# Patient Record
Sex: Male | Born: 1935
Health system: Southern US, Community
[De-identification: ages and names within clinical notes are randomized; demographics above are authoritative.]

## PROBLEM LIST (undated history)

## (undated) DIAGNOSIS — H409 Unspecified glaucoma: Secondary | ICD-10-CM

## (undated) DIAGNOSIS — E785 Hyperlipidemia, unspecified: Secondary | ICD-10-CM

## (undated) DIAGNOSIS — I493 Ventricular premature depolarization: Secondary | ICD-10-CM

## (undated) DIAGNOSIS — I480 Paroxysmal atrial fibrillation: Secondary | ICD-10-CM

## (undated) DIAGNOSIS — F0390 Unspecified dementia without behavioral disturbance: Secondary | ICD-10-CM

## (undated) DIAGNOSIS — I10 Essential (primary) hypertension: Secondary | ICD-10-CM

## (undated) DIAGNOSIS — N189 Chronic kidney disease, unspecified: Secondary | ICD-10-CM

## (undated) DIAGNOSIS — I714 Abdominal aortic aneurysm, without rupture, unspecified: Secondary | ICD-10-CM

## (undated) DIAGNOSIS — I5030 Unspecified diastolic (congestive) heart failure: Secondary | ICD-10-CM

## (undated) HISTORY — DX: Unspecified diastolic (congestive) heart failure: I50.30

## (undated) HISTORY — DX: Abdominal aortic aneurysm, without rupture: I71.4

## (undated) HISTORY — PX: APPENDECTOMY: SHX54

## (undated) HISTORY — PX: EYE SURGERY: SHX253

## (undated) HISTORY — DX: Paroxysmal atrial fibrillation: I48.0

## (undated) HISTORY — PX: TENNIS ELBOW RELEASE/NIRSCHEL PROCEDURE: SHX6651

## (undated) HISTORY — DX: Ventricular premature depolarization: I49.3

## (undated) HISTORY — DX: Unspecified glaucoma: H40.9

## (undated) HISTORY — PX: TONSILLECTOMY: SUR1361

## (undated) HISTORY — PX: HERNIA REPAIR: SHX51

## (undated) HISTORY — DX: Chronic kidney disease, unspecified: N18.9

## (undated) HISTORY — PX: TOTAL SHOULDER REPLACEMENT: SUR1217

## (undated) HISTORY — DX: Abdominal aortic aneurysm, without rupture, unspecified: I71.40

---

## 2000-04-26 ENCOUNTER — Encounter (HOSPITAL_BASED_OUTPATIENT_CLINIC_OR_DEPARTMENT_OTHER): Payer: Self-pay | Admitting: General Surgery

## 2000-04-28 ENCOUNTER — Ambulatory Visit (HOSPITAL_COMMUNITY): Admission: RE | Admit: 2000-04-28 | Discharge: 2000-04-28 | Payer: Self-pay | Admitting: General Surgery

## 2000-04-28 ENCOUNTER — Encounter (INDEPENDENT_AMBULATORY_CARE_PROVIDER_SITE_OTHER): Payer: Self-pay | Admitting: *Deleted

## 2008-09-12 ENCOUNTER — Ambulatory Visit (HOSPITAL_COMMUNITY): Admission: RE | Admit: 2008-09-12 | Discharge: 2008-09-13 | Payer: Self-pay | Admitting: Orthopedic Surgery

## 2010-04-02 ENCOUNTER — Ambulatory Visit (HOSPITAL_COMMUNITY): Admission: RE | Admit: 2010-04-02 | Discharge: 2010-04-03 | Payer: Self-pay | Admitting: Orthopedic Surgery

## 2010-12-20 ENCOUNTER — Encounter: Payer: Self-pay | Admitting: Specialist

## 2011-02-11 ENCOUNTER — Other Ambulatory Visit: Payer: Self-pay | Admitting: Family Medicine

## 2011-02-16 LAB — COMPREHENSIVE METABOLIC PANEL
ALT: 20 U/L (ref 0–53)
AST: 20 U/L (ref 0–37)
Albumin: 4.5 g/dL (ref 3.5–5.2)
Alkaline Phosphatase: 67 U/L (ref 39–117)
BUN: 26 mg/dL — ABNORMAL HIGH (ref 6–23)
CO2: 28 mEq/L (ref 19–32)
Calcium: 9.6 mg/dL (ref 8.4–10.5)
Chloride: 102 mEq/L (ref 96–112)
Creatinine, Ser: 1.38 mg/dL (ref 0.4–1.5)
GFR calc Af Amer: 60 mL/min — ABNORMAL LOW (ref >60)
GFR calc non Af Amer: 49 mL/min — ABNORMAL LOW (ref >60)
Glucose, Bld: 117 mg/dL — ABNORMAL HIGH (ref 70–99)
Potassium: 4.1 mEq/L (ref 3.5–5.1)
Sodium: 139 mEq/L (ref 135–145)
Total Bilirubin: 0.9 mg/dL (ref 0.3–1.2)
Total Protein: 6.7 g/dL (ref 6.0–8.3)

## 2011-02-16 LAB — CBC
HCT: 46.5 % (ref 39.0–52.0)
Hemoglobin: 16.1 g/dL (ref 13.0–17.0)
MCHC: 34.6 g/dL (ref 30.0–36.0)
MCV: 94.8 fL (ref 78.0–100.0)
Platelets: 196 10*3/uL (ref 150–400)
RBC: 4.9 MIL/uL (ref 4.22–5.81)
RDW: 13.7 % (ref 11.5–15.5)
WBC: 8.1 10*3/uL (ref 4.0–10.5)

## 2011-02-16 LAB — URINALYSIS, ROUTINE W REFLEX MICROSCOPIC
Glucose, UA: NEGATIVE mg/dL
Hgb urine dipstick: NEGATIVE
Ketones, ur: NEGATIVE mg/dL
Protein, ur: NEGATIVE mg/dL
pH: 6.5 (ref 5.0–8.0)

## 2011-02-16 LAB — PROTIME-INR: Prothrombin Time: 13.5 seconds (ref 11.6–15.2)

## 2011-04-13 NOTE — Op Note (Signed)
NAME:  Shane Knight, Shane Knight NO.:  [id number]   MEDICAL RECORD NO.:  [id number]          PATIENT TYPE:  OIB   LOCATION:  5037                         FACILITY:  [facility]   PHYSICIAN:  Vania Rea. Supple, M.D.  DATE OF BIRTH:  12-04-1924   DATE OF PROCEDURE:  09/12/2008  DATE OF DISCHARGE:                               OPERATIVE REPORT   PREOPERATIVE DIAGNOSIS:  Left shoulder osteoarthritis.   POSTOPERATIVE DIAGNOSIS:  Left shoulder osteoarthritis.   PROCEDURE:  Left total shoulder arthroplasty utilizing a DePuy global  stem Press-Fit size 12, a cemented 48 pegged glenoid, and a 48 x 15  humeral head.   SURGEON:  Vania Rea. Supple, MD   ASSISTANT:  Lucita Lora. Shuford, PA-C   ANESTHESIA:  General endotracheal as well as local anesthetic at the end  of the case.   ESTIMATED BLOOD LOSS:  250 mL.   DRAINS:  None.   HISTORY:  Mr. Jessop is an 77 year old gentleman who has had chronic left  shoulder pain and significant functional limitations secondary to end-  stage osteoarthrosis.  His radiographs show marked deformity of the  humeral head with complete loss of joint space and peripheral osteophyte  formation.  Due to his ongoing pain and functional limitations, he is  brought to the operating room at this time for a planned left shoulder  arthroplasty.   Preoperatively, we counseled Mr. Nylund on treatment options as well as  risks versus benefits thereof.  Possible surgical complications of  bleeding, infection, neurovascular injury, persistent pain, loss of  motion, and possible need for revision surgery were reviewed.  He  understands and accepts and agrees with our planned procedure.   PROCEDURE IN DETAIL:  After undergoing routine preop evaluation, the  patient received prophylactic antibiotics.  An attempt was made at  interscalene block by the Anesthesia Department but successful  positioning of the block could not be achieved, so it was abandoned.  The patient did  receive prophylactic antibiotics.  He was brought to the  operating room, placed supine on the operating table, and underwent  smooth induction of general endotracheal anesthesia.  He was placed into  a gentle beach-chair position with approximately 30 degrees of elevation  of head of the bed.  The left shoulder girdle region was then sterilely  prepped and draped in standard fashion.  An anterior deltopectoral  incision was then made approximately 15 cm in length, centered over the  anterior aspect of the shoulder, beginning at the coracoid process and  extending laterally and distally.  Skin flaps were elevated.  Electrocautery was used for hemostasis.  The deltopectoral interval was  then identified and developed and the cephalic vein was retracted  laterally with the deltoid.  A self-retaining retractor was placed.  Conjoint tendon was identified and retracted medially.  The anterior  circumflex vessels were identified and were ligated as the subscapularis  was divided away from its attachment to the lesser tuberosity and then  free margin of the subscapularis was elevated and tagged with a series  of three #2 FiberWire sutures and  then it was completely mobilized  circumferentially dividing the anterior capsule and labrum as well.  This was then retracted anteriorly with a pitchfork retractor.  We then  completed resection of the capsule from the margin of the humeral head  and there was very prominent osteophyte inferiorly and anteriorly which  was divided with a rongeur and the capsule was completely released  allowing the delivery of the humeral head through the wound.  We then  used an oscillating saw to make an appropriate cut across the humeral  head protecting the rotator cuff and we also performed the tenotomy of  the biceps tendon.  We then placed a Fukuda retractor and performed a  circumferential release of the labrum and completely excised the labrum  from the periphery of  the glenoid.  The glenoid was then measured and  then 48 glenoid had the best coverage.  We then placed a central drill  hole and placed the 48 reamer and reamed the glenoid to a smooth  surface.  There was some subchondral cystic changes posteriorly which  were curetted out.  There was otherwise an excellent rim of subchondral  bone that we obtained.  We then performed the placement of drill holes  for the central peg as well as peripheral stabilizing pegs for the  pegged glenoid.  Copious lavage and irrigation was then completed.  We  performed a trial implant and this showed excellent fit and coverage.  The trial was removed.  The glenoid was meticulously cleaned and dried.  Cement was mixed in the appropriate consistency.  It was injected into  the peripheral peg holes.  The permanent glenoid was then impacted into  position and held as the cement hardened and excellent fit was achieved.  After this had hardened, we returned our attention to the proximal  humerus where we performed sequential reamings up to a size 12 of the  humeral canal and then broached upto size 12 and then performed a trial  reduction with both 48 and 52 heads and the 48 head ultimately had the  best coverage after we identified a number of osteophytes posteriorly  and inferiorly that needed to be debrided away.  The 48 head showed  excellent coverage of the cut surface of the humerus.  We removed the  trial broach.  The final size 12 stem was introduced in the humeral  canal and bone that we had harvested from the cut humeral head segment  was then packed around the fins and the proximal aspect of the humeral  implant and then the humeral stem was terminally impacted obtaining  excellent purchase and fit.  The 48 x 15 head was then impacted into the  [facility] taper of the humeral stem.  Then final reduction was performed.  There was excellent soft tissue balance, good stability, and good  mobility of the shoulder.   Final irrigation was performed and hemostasis  was obtained.  We repaired the subscapularis through bone tunnels in the  lesser tuberosity and then additionally repaired the rotator interval  approximately 2-3 cm from the tuberosities in a side-to-side fashion  with figure-of-eight #2 FiberWire sutures.  We then performed a biceps  tenodesis with the FiberWire and then amputated the excess stump of the  biceps tendon.  The wound was then terminally irrigated.  The  deltopectoral interval was allowed to close and was reapproximated  loosely with 0 Vicryl with a single #2 FiberWire at the midpoint of the  incision as  a marking stitch.  The subcu layer was then closed with 2-0  Vicryl and intracuticular 3-0 Monocryl used to close skin, followed by  Steri-Strips.  A bulky dry dressing was then applied over the left  shoulder.  Left arm was placed in sling immobilizer.  The patient was  then awakened, extubated, and taken to recovery room in stable  condition.      Vania Rea. Supple, M.D.  Electronically Signed     KMS/MEDQ  D:  09/12/2008  T:  09/13/2008  Job:  [id number]

## 2011-04-16 NOTE — Op Note (Signed)
[place]  Patient:    Shane Knight, Shane Knight                         MRN: [id number] Proc. Date: 04/28/00 Adm. Date:  [id number] Disc. Date: [id number] Attending:  Sonda Primes CC:         Mardene Celeste. Lurene Shadow, M.D. (2)                           Operative Report  PREOPERATIVE DIAGNOSIS:  Right inguinal hernia.  POSTOPERATIVE DIAGNOSIS:  Right inguinal hernia, direct and indirect.  PROCEDURE:   Right inguinal herniorrhaphy with Prolene mesh.  SURGEON:  Mardene Celeste. Lurene Shadow, M.D.  ASSISTANTRubye Oaks, PA student.  ANESTHESIA:  MAC.  I used 1% xylocaine with epinephrine.  BRIEF HISTORY:  This is a 77 year old presenting with right-sided groin bulge which on evaluation is noted to be a right inguinal hernia.  He discovered it 1 week prior to his evaluation.  This apparently had come on or he noted it after strenuous activity.  He had no symptoms of discomfort and he has not had any symptoms consistent with bowel obstruction.  He is brought to the operating room now for inguinal herniorrhaphy.  PROCEDURE: Following the induction of satisfactory sedation the patient was positioned supinely.  The right groin is prepped and draped to be included in the sterile operative field.  Infiltrating the right groin and lower abdominal crease with 1% Xylocaine with epinephrine.  I made a transverse incision in the lower abdominal crease and deepened this through the skin and subcutaneous tissue carrying it down to the external oblique aponeurosis.  Additional injections of xylocaine were used as needed.  The external oblique aponeurosis was opened up through the external inguinal ring with protection of the ilioinguinal nerve which was retracted inferiorly and laterally.  The spermatic cord was elevated and held with a Penrose drain.  The large fatty filled indirect sac was dissected free from the spermatic cord carrying dissection up to the internal ring.  At  the internal ring the sac was opened. The contents within the sac were reduced back into the peritoneal cavity and the sac was then doubly dilated with 2-0 silk sutures.  Redundant sac amputated for a different pathologic evaluation.  On inspection of direct space there was noted to be a rather large direct inguinal hernia which was repaired by an onlay patch of Prolene mesh sewn into the pubic tubercle and carried up along the conjoint tendon to the internal ring and again from the pubic tubercle carried up along the shelving edge of Pouparts ligament to the internal ring.  The mesh was slit so as to allow the normal protrusion of the spermatic cord.  The tails of the mesh were then trimmed and sutured to the internal oblique muscles.  The resulting reformed internal ring was noted to be adequate without constriction on the vessels of the cord.  The patient was asked for a cough response which was strong and the repair was noted to be intact.  Sponge, instrument, and sharp counts have been verified.  The wound was closed in layers as follows:  The external oblique aponeurosis closed with a running suture of 2-0 Vicryl, Scarpas fascia closed with a running suture of 3-0 Vicryl and the skin closed with a subcuticular stitch of 4-0 Monocryl.  Steri-Strips were placed to reinforce the wounds.  Sterile dressings  applied.  Anesthetic reversed and the patient removed from the operating room to the recovery room in stable condition having tolerated the procedure well. DD:  04/28/00 TD:  05/02/00 Job: [id number]

## 2011-08-30 LAB — URINALYSIS, ROUTINE W REFLEX MICROSCOPIC
Bilirubin Urine: NEGATIVE
Ketones, ur: NEGATIVE
Nitrite: NEGATIVE
Protein, ur: NEGATIVE
Urobilinogen, UA: 0.2

## 2011-08-30 LAB — CBC
HCT: 46.6
MCHC: 33.6
MCV: 95.1
RBC: 4.9

## 2011-08-30 LAB — BASIC METABOLIC PANEL
BUN: 27 — ABNORMAL HIGH
CO2: 28
Chloride: 103
Creatinine, Ser: 1.25
GFR calc Af Amer: >60
Potassium: 4

## 2011-08-30 LAB — COMPREHENSIVE METABOLIC PANEL
AST: 24
BUN: 20
CO2: 30
Calcium: 10.4
Chloride: 101
Creatinine, Ser: 1.13
GFR calc Af Amer: >60
GFR calc non Af Amer: >60
Total Bilirubin: 1.3 — ABNORMAL HIGH

## 2011-08-30 LAB — PROTIME-INR
INR: 0.9
Prothrombin Time: 12.3

## 2011-08-30 LAB — APTT: aPTT: 26

## 2017-04-28 ENCOUNTER — Emergency Department (HOSPITAL_BASED_OUTPATIENT_CLINIC_OR_DEPARTMENT_OTHER)
Admission: EM | Admit: 2017-04-28 | Discharge: 2017-04-28 | Disposition: A | Discharge status: Home / Self Care | Payer: Medicare Other | Attending: Emergency Medicine | Admitting: Emergency Medicine

## 2017-04-28 ENCOUNTER — Encounter (HOSPITAL_BASED_OUTPATIENT_CLINIC_OR_DEPARTMENT_OTHER): Payer: Self-pay | Admitting: *Deleted

## 2017-04-28 ENCOUNTER — Emergency Department (HOSPITAL_BASED_OUTPATIENT_CLINIC_OR_DEPARTMENT_OTHER): Payer: Medicare Other

## 2017-04-28 DIAGNOSIS — W228XXA Striking against or struck by other objects, initial encounter: Secondary | ICD-10-CM | POA: Insufficient documentation

## 2017-04-28 DIAGNOSIS — Y92009 Unspecified place in unspecified non-institutional (private) residence as the place of occurrence of the external cause: Secondary | ICD-10-CM | POA: Diagnosis not present

## 2017-04-28 DIAGNOSIS — S0990XA Unspecified injury of head, initial encounter: Secondary | ICD-10-CM | POA: Diagnosis present

## 2017-04-28 DIAGNOSIS — Y999 Unspecified external cause status: Secondary | ICD-10-CM | POA: Insufficient documentation

## 2017-04-28 DIAGNOSIS — I10 Essential (primary) hypertension: Secondary | ICD-10-CM | POA: Insufficient documentation

## 2017-04-28 DIAGNOSIS — W01118A Fall on same level from slipping, tripping and stumbling with subsequent striking against other sharp object, initial encounter: Secondary | ICD-10-CM | POA: Insufficient documentation

## 2017-04-28 DIAGNOSIS — Z23 Encounter for immunization: Secondary | ICD-10-CM | POA: Insufficient documentation

## 2017-04-28 DIAGNOSIS — Z7982 Long term (current) use of aspirin: Secondary | ICD-10-CM | POA: Insufficient documentation

## 2017-04-28 DIAGNOSIS — W19XXXA Unspecified fall, initial encounter: Secondary | ICD-10-CM

## 2017-04-28 DIAGNOSIS — Y939 Activity, unspecified: Secondary | ICD-10-CM | POA: Insufficient documentation

## 2017-04-28 DIAGNOSIS — Z87891 Personal history of nicotine dependence: Secondary | ICD-10-CM | POA: Diagnosis not present

## 2017-04-28 DIAGNOSIS — S0101XA Laceration without foreign body of scalp, initial encounter: Secondary | ICD-10-CM

## 2017-04-28 DIAGNOSIS — Z79899 Other long term (current) drug therapy: Secondary | ICD-10-CM | POA: Insufficient documentation

## 2017-04-28 HISTORY — DX: Essential (primary) hypertension: I10

## 2017-04-28 HISTORY — DX: Hyperlipidemia, unspecified: E78.5

## 2017-04-28 MED ORDER — TETANUS-DIPHTH-ACELL PERTUSSIS 5-2.5-18.5 LF-MCG/0.5 IM SUSP
0.5000 mL | Freq: Once | INTRAMUSCULAR | Status: AC
  Administered 2017-04-28: 0.5 mL via INTRAMUSCULAR
  Filled 2017-04-28: qty 0.5

## 2017-04-28 NOTE — ED Provider Notes (Signed)
[place] DEPT [facility] Provider Note   CSN: [id number] Arrival date & time: 04/28/17  1801  By signing my name below, I, Jaquelyn Bitter., attest that this documentation has been prepared under the direction and in the presence of Kip Kautzman, Gwenyth Allegra, *. Electronically signed: Jaquelyn Bitter., ED Scribe. 04/28/17. 6:27 PM.   History   Chief Complaint Chief Complaint  Patient presents with  . Head Injury    HPI Shane Knight is a 83 y.o. male who presents to the Emergency Department [facility] complaining of head injury with onset x1.5 hour s/p mechanical fall. Pt states that he was at the computer and when trying to get up to answer the telephone, he tripped over a cord hitting his head on a desk. Pt sustained a laceration to the top of the head which he states bled heavily initially. He also reports a headache and rates the pain 4/10. Pt describes the pain as flat and states that the pain is located at the top of the head. He denies any modifying factors. Pt denies chest pain, SOB, nausea, vomiting, LOC, abdominal pain, neck pain, vision changes, epistaxis, weakness. Of note, Daughter states that pt was started on a course of Prednisone for pinched nerve by his orthopedist. Pt's tetanus status is unknown.  The history is provided by the patient. No language interpreter was used.  Head Injury   The incident occurred 1 to 2 hours ago. He came to the ER via EMS. The injury mechanism was a fall. There was no loss of consciousness. The volume of blood lost was moderate. Quality: flat. The pain is at a severity of 4/10. The pain is mild. The pain has been constant since the injury. Pertinent negatives include no numbness, no blurred vision, no vomiting and no weakness. He was found conscious by EMS personnel. He has tried nothing for the symptoms.    Past Medical History:  Diagnosis Date  . Hyperlipemia   . Hypertension     There are no active problems to display for  this patient.   History reviewed. No pertinent surgical history.     Home Medications    Prior to Admission medications   Medication Sig Start Date End Date Taking? Authorizing Provider  acetaminophen (TYLENOL) 325 MG tablet Take 650 mg by mouth every 6 (six) hours as needed.   Yes [provider]  aspirin 81 MG chewable tablet Chew by mouth daily.   Yes [provider]  lisinopril-hydrochlorothiazide (PRINZIDE,ZESTORETIC) 10-12.5 MG tablet Take 1 tablet by mouth daily.   Yes [provider]  Multiple Vitamins-Minerals (MULTIVITAMIN WITH MINERALS) tablet Take 1 tablet by mouth daily.   Yes [provider]  predniSONE (STERAPRED UNI-PAK 21 TAB) 5 MG (21) TBPK tablet Take 5 mg by mouth daily.   Yes [provider]    Family History History reviewed. No pertinent family history.  Social History Social History  Substance Use Topics  . Smoking status: Former Research scientist (life sciences)  . Smokeless tobacco: Never Used  . Alcohol use Not on file     Allergies   Patient has no known allergies.   Review of Systems Review of Systems  HENT: Negative for nosebleeds.   Eyes: Negative for blurred vision and visual disturbance.  Cardiovascular: Negative for chest pain.  Gastrointestinal: Negative for abdominal pain, nausea and vomiting.  Musculoskeletal: Negative for neck pain.  Skin: Positive for wound.  Neurological: Positive for headaches. Negative for syncope, weakness and numbness.  All other  systems reviewed and are negative.    Physical Exam Updated Vital Signs BP 140/75 (BP Location: Right Arm)   Pulse (!) 105   Temp 98.2 F (36.8 C) (Oral)   Resp 18   Ht 5\' 8"  (1.727 m)   Wt 194 lb (88 kg)   SpO2 95%   BMI 29.50 kg/m   Physical Exam  Constitutional: He appears well-developed and well-nourished. No distress.  HENT:  Head: Head is with laceration.    Right Ear: External ear normal.  Left Ear: External ear normal.  Mouth/Throat:  Oropharynx is clear and moist.  Eyes: Conjunctivae and EOM are normal. Pupils are equal, round, and reactive to light.  Neck: Normal range of motion. Neck supple.  No neck tenderness.   Cardiovascular: Normal rate and regular rhythm.   No murmur heard. Pulmonary/Chest: Effort normal and breath sounds normal. No stridor. No respiratory distress. He has no wheezes. He exhibits no tenderness.  Abdominal: Soft. There is no tenderness.  Musculoskeletal: He exhibits no edema or tenderness.  Neurological: He is alert. He displays normal reflexes. No cranial nerve deficit or sensory deficit. He exhibits normal muscle tone. Coordination normal.  Skin: Skin is warm and dry. Capillary refill takes less than 2 seconds. Laceration noted. He is not diaphoretic. No pallor.  1.5 cm laceration to the top of the head that is hemostatic.   Psychiatric: He has a normal mood and affect.  Nursing note and vitals reviewed.    ED Treatments / Results   DIAGNOSTIC STUDIES: Oxygen Saturation is 95% on RA, inadequate by my interpretation.   COORDINATION OF CARE: 6:27 PM-Discussed next steps with pt. Pt verbalized understanding and is agreeable with the plan.    Labs (all labs ordered are listed, but only abnormal results are displayed) Labs Reviewed - No data to display  EKG  EKG Interpretation None       Radiology Ct Head Wo Contrast  Result Date: 04/28/2017 CLINICAL DATA:  83 year old male status post fall backwards hitting head on but case. Scalp laceration. EXAM: CT HEAD WITHOUT CONTRAST TECHNIQUE: Contiguous axial images were obtained from the base of the skull through the vertex without intravenous contrast. COMPARISON:  Cervical spine radiographs 08/13/2016. FINDINGS: Brain: Cerebral volume is within normal limits for age. Small chronic infarcts suspected in the posteromedial right cerebellar hemisphere. No other focal encephalomalacia identified. No midline shift, ventriculomegaly, mass  effect, evidence of mass lesion, intracranial hemorrhage or evidence of cortically based acute infarction. Elsewhere gray-white matter differentiation is within normal limits throughout the brain. Vascular: Calcified atherosclerosis at the skull base. No suspicious intracranial vascular hyperdensity. Skull: Intact.  No acute osseous abnormality identified. Sinuses/Orbits: Clear. Other: Postoperative changes to the globes, otherwise negative orbits soft tissues. No scalp hematoma identified. No subcutaneous gas identified to localize a scalp laceration. IMPRESSION: 1. No acute intracranial abnormality. No acute traumatic injury identified. 2. Age related cerebral volume loss and possible small chronic right cerebellar infarct. Electronically Signed   By: Genevie Ann M.D.   On: 04/28/2017 19:10    Procedures .Marland KitchenLaceration Repair Date/Time: 04/29/2017 1:26 PM Performed by: Courtney Paris Authorized by: Courtney Paris   Consent:    Consent obtained:  Verbal   Consent given by:  Patient   Risks discussed:  Infection, need for additional repair, poor wound healing, poor cosmetic result, pain and retained foreign body   Alternatives discussed:  No treatment Anesthesia (see MAR for exact dosages):    Anesthesia method:  None Laceration details:  Location:  Scalp   Scalp location:  Crown   Length (cm):  2   Depth (mm):  1 Repair type:    Repair type:  Simple Pre-procedure details:    Preparation:  Imaging obtained to evaluate for foreign bodies and patient was prepped and draped in usual sterile fashion Exploration:    Hemostasis achieved with:  Direct pressure   Wound exploration: wound explored through full range of motion and entire depth of wound probed and visualized     Contaminated: no   Treatment:    Area cleansed with:  Saline   Amount of cleaning:  Standard   Irrigation solution:  Sterile saline   Irrigation method:  Syringe   Visualized foreign bodies/material removed:  no   Skin repair:    Repair method:  Tissue adhesive Approximation:    Approximation:  Close   Vermilion border: well-aligned   Post-procedure details:    Dressing:  Antibiotic ointment   Patient tolerance of procedure:  Tolerated well, no immediate complications   (including critical care time)  Medications Ordered in ED Medications  Tdap (BOOSTRIX) injection 0.5 mL (0.5 mLs Intramuscular Given 04/28/17 1918)     Initial Impression / Assessment and Plan / ED Course  I have reviewed the triage vital signs and the nursing notes.  Pertinent labs & imaging results that were available during my care of the patient were reviewed by me and considered in my medical decision making (see chart for details).     REFORD OLLIFF is a 83 y.o. male With a past medical history significant for hypertension and hyperlipidemia who presents with a mechanical fall. Patient tripped on a computer cord and struck the top of his head on a table. No loss of consciousness. No vision changes, no vomiting. No neurologic complaints. No neck pain.  History and exam are seen above. Patient and small laceration on top of had. Small bleeding. No crepitance. Unremarkable neurologic exam. Chest and back nontender. Neck nontender. Lungs clear.  Tetanus shot was updated. CT head showed no acute traumatic injuries. No bleeding. No fracture.  One was washed and well approximated. Derma bond used for closure. Laceration repair without location.   Patient given instructions for PCP follow-up and strict return precautions for symptoms. Patient advised on return precautions for delayed bleed. Patient and family understood plan of care and had no other questions or concerns. Patient discharged in good condition.    Final Clinical Impressions(s) / ED Diagnoses   Final diagnoses:  Injury of head, initial encounter  Fall, initial encounter  Laceration of scalp, initial encounter    New Prescriptions Discharge Medication  List as of 04/28/2017  9:13 PM     I personally performed the services described in this documentation, which was scribed in my presence. The recorded information has been reviewed and is accurate.  Clinical Impression: 1. Injury of head, initial encounter   2. Fall, initial encounter   3. Laceration of scalp, initial encounter     Disposition: Discharge  Condition: Good  I have discussed the results, Dx and Tx plan with the pt(& family if present). He/she/they expressed understanding and agree(s) with the plan. Discharge instructions discussed at great length. Strict return precautions discussed and pt &/or family have verbalized understanding of the instructions. No further questions at time of discharge.    Discharge Medication List as of 04/28/2017  9:13 PM      Follow Up: [facility] [place]  [place] [id number] [phone] Schedule an appointment as soon as possible for a visit    [place] [address] [id number] [place] [address] [place] [phone]  If symptoms worsen      Carmon Brigandi, Gwenyth Allegra, MD 04/29/17 1327

## 2017-04-28 NOTE — ED Notes (Signed)
ED Provider at bedside. 

## 2017-04-28 NOTE — ED Triage Notes (Signed)
Pt reports his foot got caught on a cord in his computer room and he tripped, hitting his head on desk. No loc, abrasion noted to crown of head, no active bleeding noted, dsd in place. Pt denies any other c/o.

## 2017-04-28 NOTE — Discharge Instructions (Signed)
Please follow-up with your primary care physician as previously directed for further medical management of your fall. Please watch for signs of infection. Please do not pick at the skin glue. If any symptoms change or worsen or you develop any concerning symptoms, please return to the nearest emergency department for evaluation.

## 2017-05-23 ENCOUNTER — Ambulatory Visit (HOSPITAL_COMMUNITY)
Admission: RE | Admit: 2017-05-23 | Discharge: 2017-05-23 | Disposition: A | Discharge status: Home / Self Care | Payer: Medicare Other | Source: Ambulatory Visit | Attending: Cardiology | Admitting: Cardiology

## 2017-05-23 ENCOUNTER — Other Ambulatory Visit (HOSPITAL_COMMUNITY): Payer: Self-pay | Admitting: Physical Medicine and Rehabilitation

## 2017-05-23 DIAGNOSIS — E785 Hyperlipidemia, unspecified: Secondary | ICD-10-CM | POA: Diagnosis not present

## 2017-05-23 DIAGNOSIS — I714 Abdominal aortic aneurysm, without rupture, unspecified: Secondary | ICD-10-CM

## 2017-05-23 DIAGNOSIS — I10 Essential (primary) hypertension: Secondary | ICD-10-CM | POA: Diagnosis not present

## 2017-05-23 DIAGNOSIS — I7789 Other specified disorders of arteries and arterioles: Secondary | ICD-10-CM | POA: Diagnosis present

## 2017-05-23 DIAGNOSIS — Z87898 Personal history of other specified conditions: Secondary | ICD-10-CM | POA: Diagnosis not present

## 2017-05-23 DIAGNOSIS — I723 Aneurysm of iliac artery: Secondary | ICD-10-CM | POA: Insufficient documentation

## 2017-05-31 ENCOUNTER — Ambulatory Visit (INDEPENDENT_AMBULATORY_CARE_PROVIDER_SITE_OTHER): Payer: Medicare Other | Admitting: Podiatry

## 2017-05-31 ENCOUNTER — Encounter: Payer: Self-pay | Admitting: Podiatry

## 2017-05-31 VITALS — Ht 68.0 in | Wt 195.0 lb

## 2017-05-31 DIAGNOSIS — M79672 Pain in left foot: Secondary | ICD-10-CM

## 2017-05-31 DIAGNOSIS — B351 Tinea unguium: Secondary | ICD-10-CM

## 2017-05-31 DIAGNOSIS — M2040 Other hammer toe(s) (acquired), unspecified foot: Secondary | ICD-10-CM

## 2017-05-31 DIAGNOSIS — M79671 Pain in right foot: Secondary | ICD-10-CM | POA: Diagnosis not present

## 2017-05-31 NOTE — Progress Notes (Signed)
SUBJECTIVE: 83 y.o. year old male presents complaining of painful toes and thick toe nails.  REVIEW OF SYSTEMS: Pertinent items noted in HPI and remainder of comprehensive ROS otherwise negative.  OBJECTIVE: DERMATOLOGIC EXAMINATION: Nails: Thick dystrophic nails x 10. Digital corn 2nd right painful.  VASCULAR EXAMINATION OF LOWER LIMBS: All pedal pulses are not palpable. No ischemic changes noted. Temperature gradient from tibial crest to dorsum of foot is within normal bilateral.  NEUROLOGIC EXAMINATION OF THE LOWER LIMBS: Achilles DTR is present and within normal. Monofilament (Semmes-Weinstein 10-gm) sensory testing positive 6 out of 6, bilateral. Vibratory sensations(128Hz  turning fork) intact at medial and lateral forefoot bilateral.  Sharp and Dull discriminatory sensations at the plantar ball of hallux is intact bilateral.   MUSCULOSKELETAL EXAMINATION: Hammer toe 2nd bilateral.  ASSESSMENT: Onychomycosis x 10. Hammer toe deformity with painful corn 2nd right.  PLAN: All nails and corns debrided. Digital corn pad dispensed. Return in 3 month.

## 2017-05-31 NOTE — Patient Instructions (Signed)
Seen for hypertrophic nails. All nails debrided. A sheet of corn pad dispensed. Return in 3 months or as needed.

## 2017-08-31 ENCOUNTER — Ambulatory Visit (INDEPENDENT_AMBULATORY_CARE_PROVIDER_SITE_OTHER): Payer: Medicare Other | Admitting: Podiatry

## 2017-08-31 ENCOUNTER — Encounter: Payer: Self-pay | Admitting: Podiatry

## 2017-08-31 DIAGNOSIS — M79672 Pain in left foot: Secondary | ICD-10-CM | POA: Diagnosis not present

## 2017-08-31 DIAGNOSIS — M79671 Pain in right foot: Secondary | ICD-10-CM

## 2017-08-31 DIAGNOSIS — B351 Tinea unguium: Secondary | ICD-10-CM

## 2017-08-31 NOTE — Progress Notes (Signed)
Subjective: 83 y.o. year old male patient presents complaining of painful nails. Patient requests toe nails trimmed. Stated that left great toe nail removed in past. Now the nail grew back and make the toe swell and pain. Has some problem with balance and uses cane.  HPI: Takes Lyrica for the past 3-4 years and Neuropathy pain has been controlled.  Patient Summary List & History reviewed for allergies, medications, medical problems and surgical history.  Objective: Dermatologic:  Thick dystrophic nails x 10. Deformed painful nail on left great toe. Dry red bumps on left foot near first MPJ Vascular: All pedal pulses are palpable. Mild ankle edema right. Orthopedic:  Rectus foot without gross deformity. Neurologic:  All epicritic and tactile sensations grossly intact.  Assessment: Dystrophic mycotic nails x 10. Tinea pedis left foot.  Treatment: Debrided all mycotic nails.  Luzu cream dispensed for the red bump on left foot. Advised to wear more open toed shoes for the left foot rash.

## 2017-08-31 NOTE — Patient Instructions (Addendum)
Seen for hypertrophic nails. All nails debrided. Stay in open toed shoes more to aerate left foot rash problem. Return in 3 months or as needed.

## 2017-12-01 ENCOUNTER — Ambulatory Visit: Payer: Medicare Other | Admitting: Podiatry

## 2019-01-29 ENCOUNTER — Ambulatory Visit (INDEPENDENT_AMBULATORY_CARE_PROVIDER_SITE_OTHER): Payer: Medicare Other | Admitting: Cardiology

## 2019-01-29 ENCOUNTER — Encounter: Payer: Self-pay | Admitting: Cardiology

## 2019-01-29 ENCOUNTER — Other Ambulatory Visit: Payer: Self-pay | Admitting: Cardiology

## 2019-01-29 VITALS — BP 126/79 | HR 80 | Ht 68.0 in | Wt 183.0 lb

## 2019-01-29 DIAGNOSIS — I129 Hypertensive chronic kidney disease with stage 1 through stage 4 chronic kidney disease, or unspecified chronic kidney disease: Secondary | ICD-10-CM | POA: Diagnosis not present

## 2019-01-29 DIAGNOSIS — I714 Abdominal aortic aneurysm, without rupture, unspecified: Secondary | ICD-10-CM

## 2019-01-29 DIAGNOSIS — I10 Essential (primary) hypertension: Secondary | ICD-10-CM

## 2019-01-29 DIAGNOSIS — N183 Chronic kidney disease, stage 3 unspecified: Secondary | ICD-10-CM | POA: Insufficient documentation

## 2019-01-29 DIAGNOSIS — I493 Ventricular premature depolarization: Secondary | ICD-10-CM | POA: Diagnosis not present

## 2019-01-29 MED ORDER — AMLODIPINE BESYLATE 5 MG PO TABS: 5.0000 mg | ORAL_TABLET | Freq: Every day | ORAL | 1 refills | Status: DC

## 2019-01-29 MED ORDER — LISINOPRIL-HYDROCHLOROTHIAZIDE 10-12.5 MG PO TABS: 1.0000 | ORAL_TABLET | Freq: Every day | ORAL | 3 refills | Status: DC

## 2019-01-29 NOTE — Progress Notes (Signed)
Subjective:   Shane Knight, male    DOB: Apr 12, 1925, 85 y.o.   MRN: [id number]  Corine Shelter, PA-C:  Chief Complaint  Patient presents with  . Follow-up    pvc's     HPI: Shane Knight  is a 85 y.o. male  with asymptomatic small 3 cm abdominal aortic aneurysm detected by duplex on 05/23/2017 while evaluating back pain. Also has past medical history of stage III chronic kidney disease, chronic back pain, hypertension, and abnormal EKG.   Echo in July 2019 was essentially unchanged compared to August of 2018 except for new mild RV dilation and mildly increased PA pressure. Blood pressure was elevated on last office visit; however, he monitored at home and blood pressure was consistently in the 563'O systolic.  Patient was last seen in December 2019, blood pressure was elevated.  He was started on amlodipine 5 mg daily that he tolerates well.  He has been out of the medication for the last 1 month as his prescription ran out and he was unsure if he should continue.  He has been monitoring his blood pressure at home and consistently in the 120s to 150s.  Daughter is present at bedside.  He has recently had abdominal aortic ultrasound for surveillance of aortic aneurysm.  He was not noted to have aneurysm on this ultrasound.  No complaints today.  Past Medical History:  Diagnosis Date  . Hyperlipemia   . Hypertension     History reviewed. No pertinent surgical history.  History reviewed. No pertinent family history.  Social History   Socioeconomic History  . Marital status: Widowed    Spouse name: Not on file  . Number of children: Not on file  . Years of education: Not on file  . Highest education level: Not on file  Occupational History  . Not on file  Social Needs  . Financial resource strain: Not on file  . Food insecurity:    Worry: Not on file    Inability: Not on file  . Transportation needs:    Medical: Not on file    Non-medical: Not on file  Tobacco Use  .  Smoking status: Former Research scientist (life sciences)  . Smokeless tobacco: Never Used  Substance and Sexual Activity  . Alcohol use: Not on file  . Drug use: Not on file  . Sexual activity: Not on file  Lifestyle  . Physical activity:    Days per week: Not on file    Minutes per session: Not on file  . Stress: Not on file  Relationships  . Social connections:    Talks on phone: Not on file    Gets together: Not on file    Attends religious service: Not on file    Active member of club or organization: Not on file    Attends meetings of clubs or organizations: Not on file    Relationship status: Not on file  . Intimate partner violence:    Fear of current or ex partner: Not on file    Emotionally abused: Not on file    Physically abused: Not on file    Forced sexual activity: Not on file  Other Topics Concern  . Not on file  Social History Narrative  . Not on file    Current Meds  Medication Sig  . acetaminophen (TYLENOL) 325 MG tablet Take 650 mg by mouth every 6 (six) hours as needed.  . latanoprost (XALATAN) 0.005 % ophthalmic solution Place  1 drop into both eyes daily.  Marland Kitchen lisinopril-hydrochlorothiazide (PRINZIDE,ZESTORETIC) 10-12.5 MG tablet Take 1 tablet by mouth daily for 30 days.  . Multiple Vitamins-Minerals (MULTIVITAMIN WITH MINERALS) tablet Take 1 tablet by mouth daily.  . pregabalin (LYRICA) 100 MG capsule Take 100 mg by mouth 2 (two) times daily after a meal.  . [DISCONTINUED] lisinopril-hydrochlorothiazide (PRINZIDE,ZESTORETIC) 10-12.5 MG tablet Take 1 tablet by mouth daily.     Review of Systems  Constitution: Negative for decreased appetite, malaise/fatigue, weight gain and weight loss.  Eyes: Negative for visual disturbance.  Cardiovascular: Negative for chest pain, claudication, dyspnea on exertion, leg swelling, orthopnea, palpitations and syncope.  Respiratory: Negative for hemoptysis and wheezing.   Endocrine: Negative for cold intolerance and heat intolerance.    Hematologic/Lymphatic: Does not bruise/bleed easily.  Skin: Negative for nail changes.  Musculoskeletal: Negative for muscle weakness and myalgias.  Gastrointestinal: Negative for abdominal pain, change in bowel habit, nausea and vomiting.  Neurological: Negative for difficulty with concentration, dizziness, focal weakness and headaches.  Psychiatric/Behavioral: Negative for altered mental status and suicidal ideas.  All other systems reviewed and are negative.      Objective:     Blood pressure 126/79, pulse 80, height 5\' 8"  (1.727 m), weight 183 lb (83 kg).  Echocardiogram 06/27/2018: Left ventricle cavity is normal in size. Mild concentric hypertrophy of the left ventricle. Wall motion probably normal. Accurate assessment of LVEF and diastolic function limited due to multiple PVC's, ?Afib.  Calculated EF 54%. Left atrial cavity is mildly dilated. Right atrial cavity is mildly dilated. Right ventricle cavity is mildly dilated. Normal right ventricular function. Moderate tricuspid regurgitation. Estimated pulmonary artery systolic pressure 43 mmHg. Mild to moderate pulmonic regurgitation. Compared to previous study on 07/27/2018, mild RV dilatation is new. PASP mildly increased.  Carotid artery duplex 07/27/2017: No hemodynamically significant arterial disease in the internal carotid artery bilaterally. Minimal soft plaque noted. Antegrade right vertebral artery flow. Antegrade left vertebral artery flow.  Abdominal aortic duplex 12/18/2018: The maximum aorta diameter is 2.1 cm (mid). No evidence of atherosclerotic plaque. Normal flow velocities noted.  No AAA noted.  Compared to outside study 05/23/2017:  Proximal abdominal aneurysm measuring 3.1 cm x 3.1 cm, moderate dilatation of the left common iliac artery measuring 1.6 cm.  Normal iliac artery velocity.  EKG 01/29/2019: Sinus rhythm at 78 bpm with first degree AV block with frequent PAC's and 1 PVC. Left atrial enlargement,  left axis deviation, left anterior fasicular block, IRBBB. Nonspecific T abnormality.  Physical Exam  Constitutional: He is oriented to person, place, and time. Vital signs are normal. He appears well-developed and well-nourished.  HENT:  Head: Normocephalic and atraumatic.  Neck: Normal range of motion.  Cardiovascular: Normal rate, regular rhythm, normal heart sounds and intact distal pulses.  Extrasystoles are present.  Pulses:      Radial pulses are 2+ on the right side and 2+ on the left side.  Pulmonary/Chest: Effort normal and breath sounds normal. No accessory muscle usage. No respiratory distress.  Abdominal: Soft. Bowel sounds are normal.  Musculoskeletal: Normal range of motion.  Neurological: He is alert and oriented to person, place, and time.  Skin: Skin is warm and dry.  Vitals reviewed.          Assessment & Recommendations:   1. Essential hypertension Stable today without amlodipine; however, home monitoring shows that has been fluctuating from 150's to 120's. I will resume amlodipine as he tolerated this well. Will refill his lisinopril HCT.  2. Frequent PVCs Has frequent PAC's today. Remains asymptomatic.   3. CKD (chronic kidney disease) stage 3, GFR 30-59 ml/min (HCC) Has remained stable.   4. Abdominal aneurysm Previously noted to be 3.1 cm in 2018, he is not noted to AAA on recent abdominal duplex. Max diameter was 2.1cm. Does not need follow up duplex in the future unless clinically indicated.   I will see him back in 6 weeks for follow up on hypertension.  Jeri Lager, FNP-C [facility], [place] Office: [phone] Fax: [phone]

## 2019-01-30 ENCOUNTER — Encounter: Payer: Self-pay | Admitting: Cardiology

## 2019-03-13 ENCOUNTER — Other Ambulatory Visit: Payer: Self-pay

## 2019-03-13 ENCOUNTER — Ambulatory Visit (INDEPENDENT_AMBULATORY_CARE_PROVIDER_SITE_OTHER): Payer: Medicare Other | Admitting: Cardiology

## 2019-03-13 ENCOUNTER — Encounter: Payer: Self-pay | Admitting: Cardiology

## 2019-03-13 ENCOUNTER — Ambulatory Visit: Payer: Medicare Other | Admitting: Cardiology

## 2019-03-13 VITALS — BP 126/67 | HR 54 | Ht 68.0 in | Wt 180.0 lb

## 2019-03-13 DIAGNOSIS — N183 Chronic kidney disease, stage 3 unspecified: Secondary | ICD-10-CM

## 2019-03-13 DIAGNOSIS — I10 Essential (primary) hypertension: Secondary | ICD-10-CM

## 2019-03-13 DIAGNOSIS — R42 Dizziness and giddiness: Secondary | ICD-10-CM | POA: Diagnosis not present

## 2019-03-13 DIAGNOSIS — I493 Ventricular premature depolarization: Secondary | ICD-10-CM

## 2019-03-13 MED ORDER — AMLODIPINE BESYLATE 2.5 MG PO TABS: 2.5000 mg | ORAL_TABLET | Freq: Every day | ORAL | 3 refills | Status: DC

## 2019-03-13 NOTE — Progress Notes (Signed)
Subjective:   Shane Knight, male    DOB: [birth year], 85 y.o.   MRN: [id number]  Corine Shelter, PA-C:  Chief Complaint  Patient presents with  . Hypertension    pt think amlodipine is making him dizzy    This visit type was conducted due to national recommendations for restrictions regarding the COVID-19 Pandemic (e.g. social distancing).  This format is felt to be most appropriate for this patient at this time.  All issues noted in this document were discussed and addressed.  No physical exam was performed (except for noted visual exam findings with Telehealth visits).  The patient has consented to conduct a Telehealth visit and understands insurance will be billed.   I discussed the limitations of evaluation and management by telemedicine and the availability of in person appointments. The patient expressed understanding and agreed to proceed.  Virtual Visit via Video Note is as below  I connected with Shane Knight, on 03/13/19 at 1055 by telephone and verified that I am speaking with the correct person using two identifiers. Patient was unable to perform video visit.    I have discussed with her regarding the safety during COVID Pandemic and steps and precautions including social distancing with the patient.    HPI: Shane Knight  is a 85 y.o. male  with asymptomatic small 3 cm abdominal aortic aneurysm detected by duplex on 05/23/2017 while evaluating back pain; however, was not noted on recent abdominal aortic duplex. Also has past medical history of stage III chronic kidney disease, chronic back pain, hypertension, and abnormal EKG.   Echo in July 2019 was essentially unchanged compared to August of 2018 except for new mild RV dilation and mildly increased PA pressure.   Patient was last seen 1 month ago and was restarted on amlodipine. Blood pressure has consistently been in the 956'L systolic with occasional spikes in the 140's. He does mention that he notices some mild dizziness  with position changes since being on the amlodipine. No syncope or falls. He is compliant with support stockings daily that helps his venous insufficiency. Has chronic, frequent PVC's that are asymptomatic and stable.  No complaints today.  Past Medical History:  Diagnosis Date  . AAA (abdominal aortic aneurysm) ([place])   . Chronic kidney disease   . Hyperlipemia   . Hypertension   . PVC (premature ventricular contraction)     Past Surgical History:  Procedure Laterality Date  . APPENDECTOMY    . EYE SURGERY     lens implants  . HERNIA REPAIR    . TENNIS ELBOW RELEASE/NIRSCHEL PROCEDURE    . TONSILLECTOMY    . TOTAL SHOULDER REPLACEMENT Bilateral     No family history on file.  Social History   Socioeconomic History  . Marital status: Widowed    Spouse name: Not on file  . Number of children: Not on file  . Years of education: Not on file  . Highest education level: Not on file  Occupational History  . Not on file  Social Needs  . Financial resource strain: Not on file  . Food insecurity:    Worry: Not on file    Inability: Not on file  . Transportation needs:    Medical: Not on file    Non-medical: Not on file  Tobacco Use  . Smoking status: Former Research scientist (life sciences)  . Smokeless tobacco: Never Used  Substance and Sexual Activity  . Alcohol use: Not on file  . Drug use:  Not on file  . Sexual activity: Not on file  Lifestyle  . Physical activity:    Days per week: Not on file    Minutes per session: Not on file  . Stress: Not on file  Relationships  . Social connections:    Talks on phone: Not on file    Gets together: Not on file    Attends religious service: Not on file    Active member of club or organization: Not on file    Attends meetings of clubs or organizations: Not on file    Relationship status: Not on file  . Intimate partner violence:    Fear of current or ex partner: Not on file    Emotionally abused: Not on file    Physically abused: Not on file     Forced sexual activity: Not on file  Other Topics Concern  . Not on file  Social History Narrative  . Not on file    Current Meds  Medication Sig  . acetaminophen (TYLENOL) 325 MG tablet Take 650 mg by mouth every 6 (six) hours as needed.  Marland Kitchen amLODipine (NORVASC) 5 MG tablet TAKE 1 TABLET BY MOUTH EVERY DAY  . latanoprost (XALATAN) 0.005 % ophthalmic solution Place 1 drop into both eyes daily.  Marland Kitchen lisinopril-hydrochlorothiazide (PRINZIDE,ZESTORETIC) 10-12.5 MG tablet Take 1 tablet by mouth daily for 30 days.  . Multiple Vitamins-Minerals (MULTIVITAMIN WITH MINERALS) tablet Take 1 tablet by mouth daily.  . Multiple Vitamins-Minerals (PRESERVISION AREDS 2+MULTI VIT PO) Take 2 capsules by mouth daily.  . pregabalin (LYRICA) 100 MG capsule Take 100 mg by mouth 2 (two) times daily after a meal.     Review of Systems  Constitution: Negative for decreased appetite, malaise/fatigue, weight gain and weight loss.  Eyes: Negative for visual disturbance.  Cardiovascular: Negative for chest pain, claudication, dyspnea on exertion, leg swelling, orthopnea, palpitations and syncope.  Respiratory: Negative for hemoptysis and wheezing.   Endocrine: Negative for cold intolerance and heat intolerance.  Hematologic/Lymphatic: Does not bruise/bleed easily.  Skin: Negative for nail changes.  Musculoskeletal: Negative for muscle weakness and myalgias.  Gastrointestinal: Negative for abdominal pain, change in bowel habit, nausea and vomiting.  Neurological: Positive for dizziness. Negative for difficulty with concentration, focal weakness and headaches.  Psychiatric/Behavioral: Negative for altered mental status and suicidal ideas.  All other systems reviewed and are negative.      Objective:     Blood pressure 126/67, pulse (!) 54, height 5\' 8"  (1.727 m), weight 180 lb (81.6 kg).  Echocardiogram 06/27/2018: Left ventricle cavity is normal in size. Mild concentric hypertrophy of the left ventricle.  Wall motion probably normal. Accurate assessment of LVEF and diastolic function limited due to multiple PVC's, ?Afib.  Calculated EF 54%. Left atrial cavity is mildly dilated. Right atrial cavity is mildly dilated. Right ventricle cavity is mildly dilated. Normal right ventricular function. Moderate tricuspid regurgitation. Estimated pulmonary artery systolic pressure 43 mmHg. Mild to moderate pulmonic regurgitation. Compared to previous study on 07/27/2018, mild RV dilatation is new. PASP mildly increased.  Carotid artery duplex 07/27/2017: No hemodynamically significant arterial disease in the internal carotid artery bilaterally. Minimal soft plaque noted. Antegrade right vertebral artery flow. Antegrade left vertebral artery flow.  Abdominal aortic duplex 12/18/2018: The maximum aorta diameter is 2.1 cm (mid). No evidence of atherosclerotic plaque. Normal flow velocities noted.  No AAA noted.  Compared to outside study 05/23/2017:  Proximal abdominal aneurysm measuring 3.1 cm x 3.1 cm, moderate dilatation of the left  common iliac artery measuring 1.6 cm.  Normal iliac artery velocity.  EKG 01/29/2019: Sinus rhythm at 78 bpm with first degree AV block with frequent PAC's and 1 PVC. Left atrial enlargement, left axis deviation, left anterior fasicular block, IRBBB. Nonspecific T abnormality.  *Physical exam not performed as this is a telephone visit*       Assessment & Recommendations:   1. Essential hypertension Blood pressure has improved with addition of amlodipine. Continue with home BP monitoring.   2. PVC (premature ventricular contraction) Has history of frequent PVC and PAC's that are asymptomatic.   3. Dizziness Noted with position changes since addition of amlodipine. In view of his advanced age and risk of falling, feel that it is imperative to avoid any dizziness. I will decrease his dose down to 2.5 mg daily. I have also asked him to take at night. Continue with daily  support stockings. Advised him on careful, slow position changes to help avoid this.   4. CKD (chronic kidney disease) stage 3, GFR 30-59 ml/min (HCC) Has been stable. I do not have recent labs from PCP office. He is on Lisinopril HCTZ 10-12.5 mg daily. Would like to avoid increasing if possible to hopefully not worsen kidney function.   Plan: Patient is overall doing well. He will contact me for any problems or concerns. I will see him back in the office in 3 months for follow up.    Jeri Lager, FNP-C [facility], [place] Office: [phone] Fax: [phone]

## 2019-03-29 IMAGING — CT CT HEAD W/O CM
3 series · 14 of 47 positions shown, 16 images · non-contrast
Comparison: Cervical spine radiographs 08/13/2016.

CLINICAL DATA: 89-year-old male status post fall backwards hitting
head on but case. Scalp laceration.

EXAM:
CT HEAD WITHOUT CONTRAST
TECHNIQUE: Contiguous axial images were obtained from the base of the skull
through the vertex without intravenous contrast.

[Series 2: head wo · axial · 0.44mm/px · z∈[-296,-171]mm · 8 of 30 slices shown, 10 images]
[im 3/30  brain]
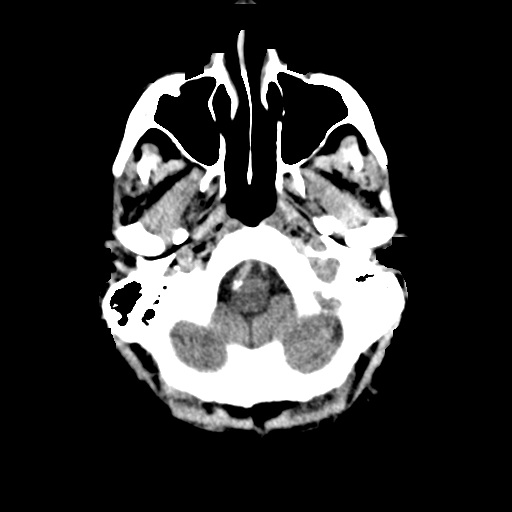
[im 3/30  bone]
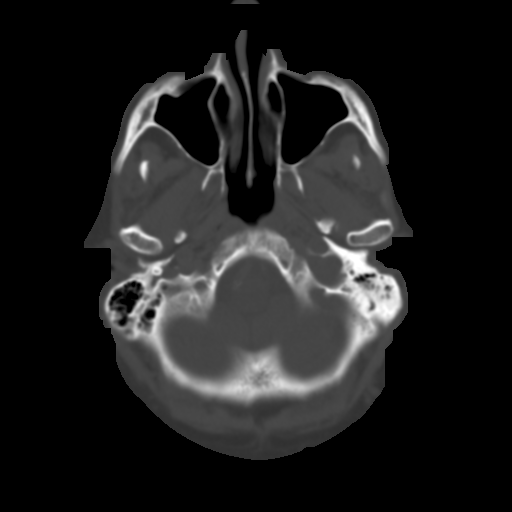
[im 7/30  brain]
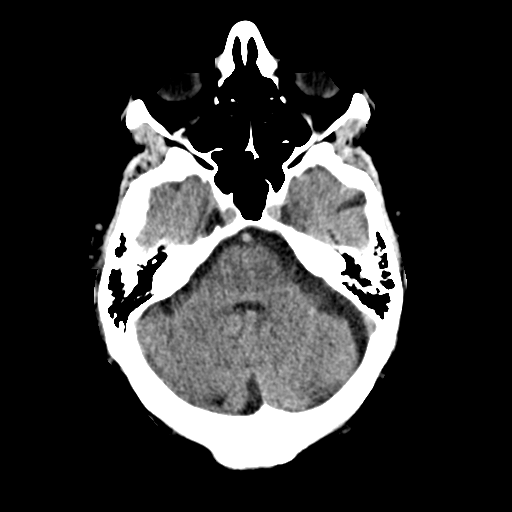
[im 10/30  brain]
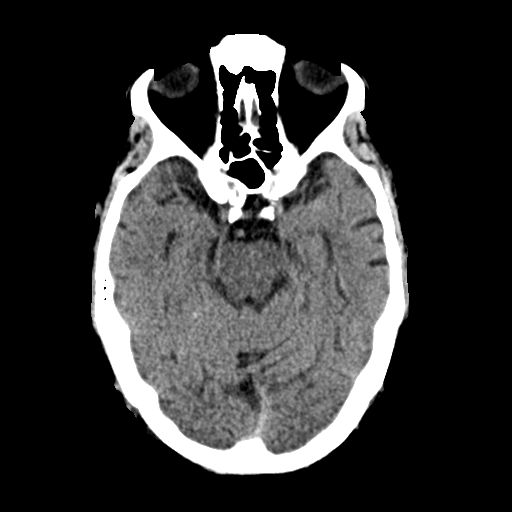
[im 14/30  brain]
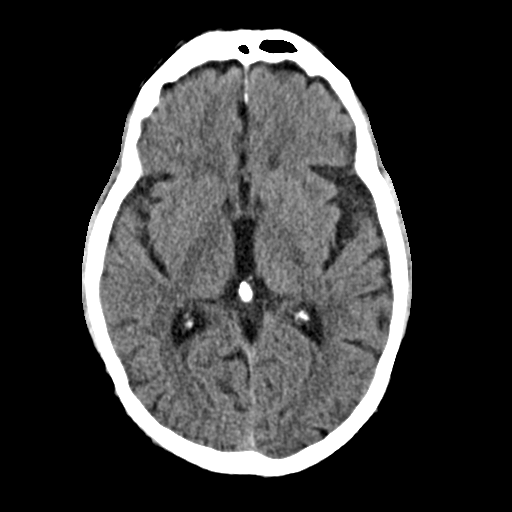
[im 17/30  brain]
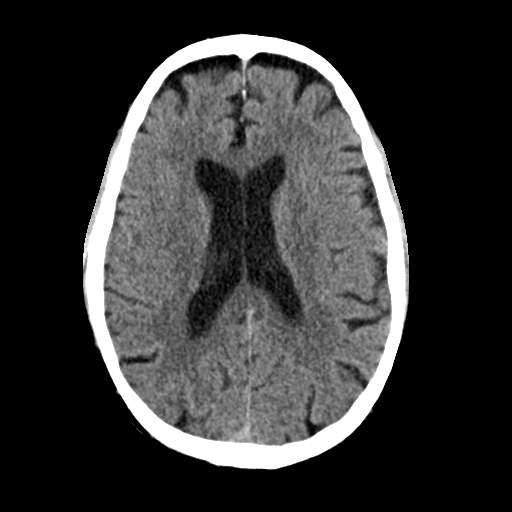
[im 17/30  bone]
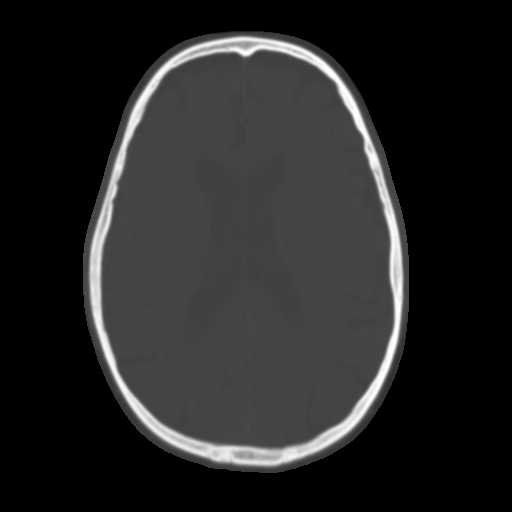
[im 21/30  brain]
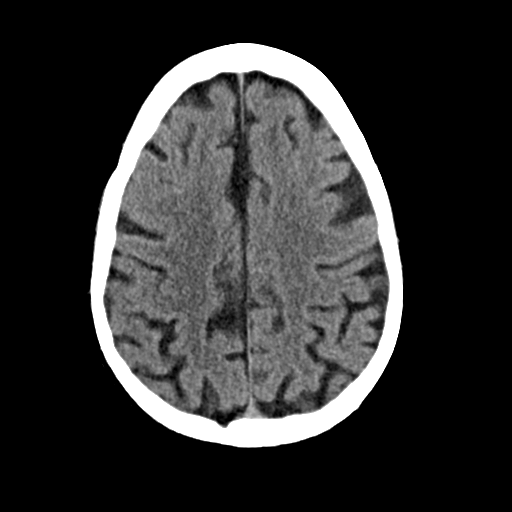
[im 24/30  brain]
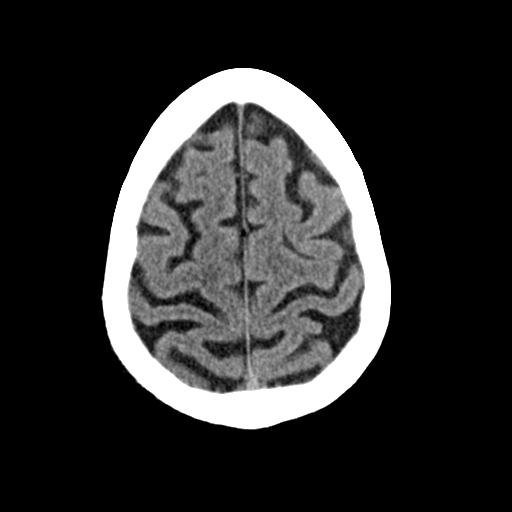
[im 28/30  brain]
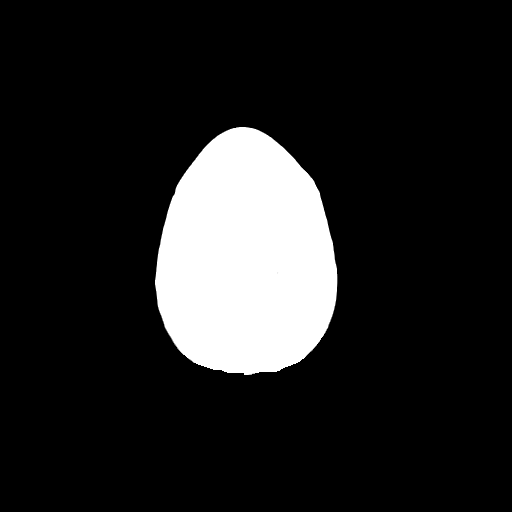

[Series 4: coronal soft · coronal · 0.28mm/px · 3 of 70 slices shown]
[im 24/70  brain]
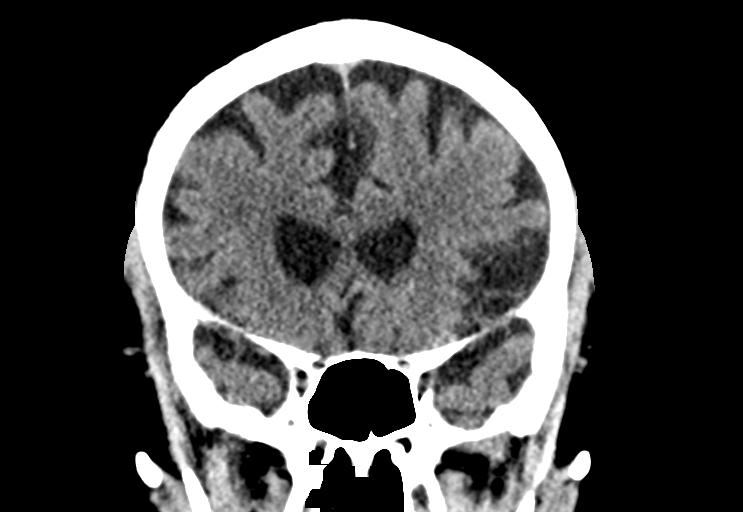
[im 31/70  brain]
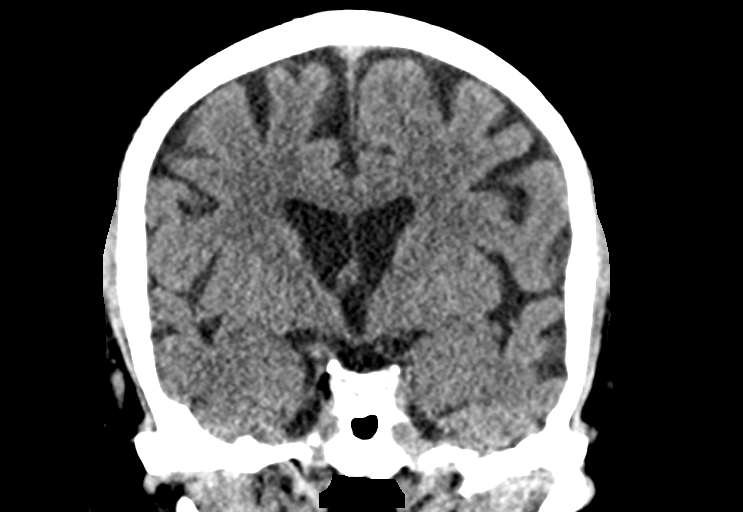
[im 39/70  brain]
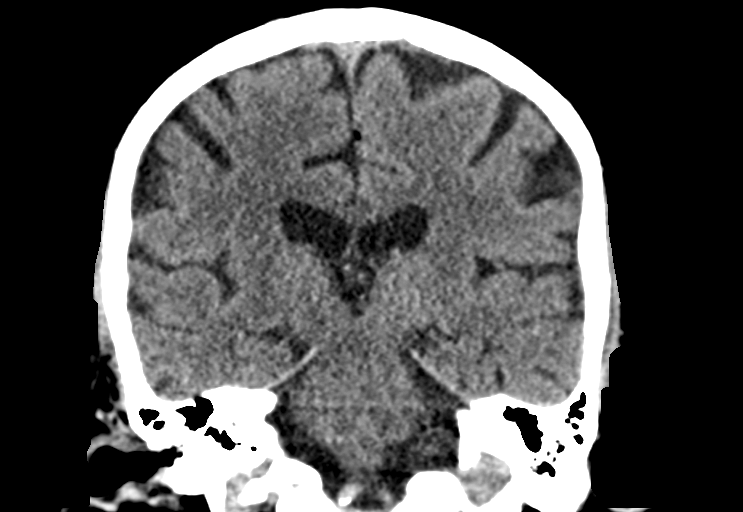

[Series 5: sag soft · sagittal · 0.29mm/px · 3 of 55 slices shown]
[im 19/55  brain]
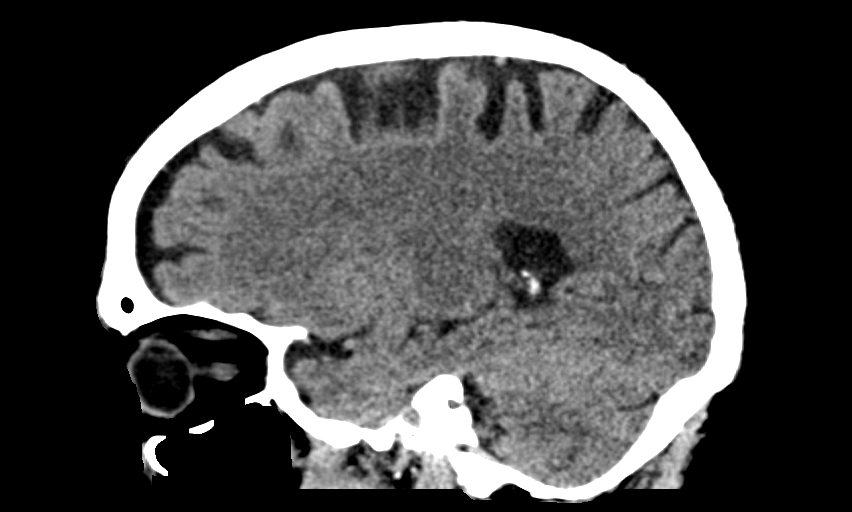
[im 28/55  brain]
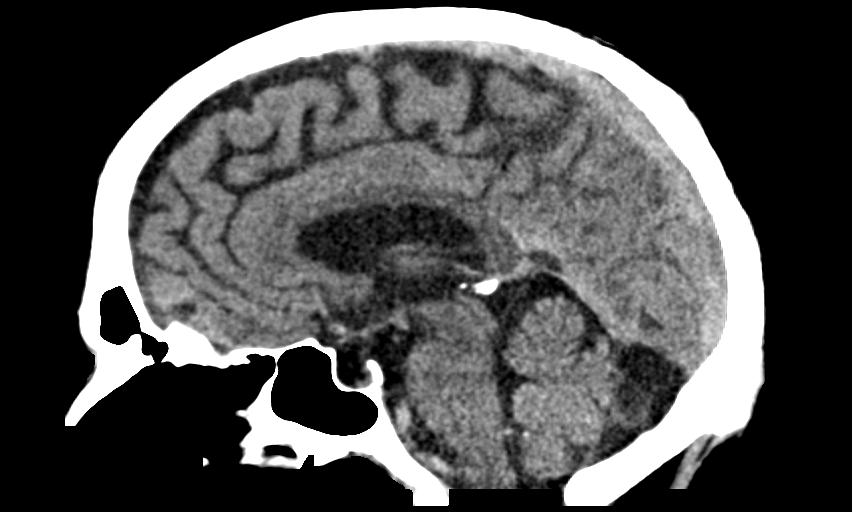
[im 37/55  brain]
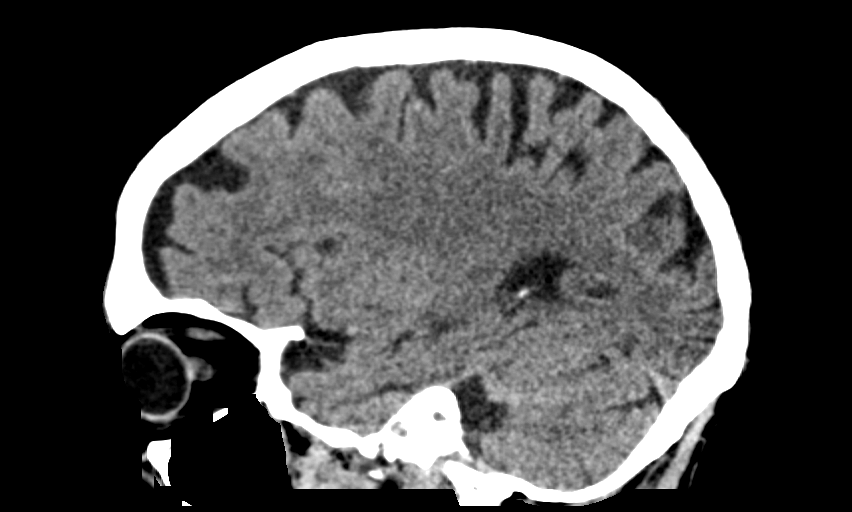

[14 of 47 positions shown; findings below may reference images not displayed]

FINDINGS: Brain: Cerebral volume is within normal limits for age. Small
chronic infarcts suspected in the posteromedial right cerebellar
hemisphere. No other focal encephalomalacia identified. No midline
shift, ventriculomegaly, mass effect, evidence of mass lesion,
intracranial hemorrhage or evidence of cortically based acute
infarction. Elsewhere gray-white matter differentiation is within
normal limits throughout the brain.

Vascular: Calcified atherosclerosis at the skull base. No suspicious
intracranial vascular hyperdensity.

Skull: Intact.  No acute osseous abnormality identified.

Sinuses/Orbits: Clear.

Other: Postoperative changes to the globes, otherwise negative
orbits soft tissues. No scalp hematoma identified. No subcutaneous
gas identified to localize a scalp laceration.
IMPRESSION: 1. No acute intracranial abnormality. No acute traumatic injury
identified.
2. Age related cerebral volume loss and possible small chronic right
cerebellar infarct.

## 2019-05-07 ENCOUNTER — Ambulatory Visit: Payer: Medicare Other | Admitting: Cardiology

## 2019-06-11 ENCOUNTER — Ambulatory Visit (INDEPENDENT_AMBULATORY_CARE_PROVIDER_SITE_OTHER): Payer: Medicare Other | Admitting: Cardiology

## 2019-06-11 ENCOUNTER — Other Ambulatory Visit: Payer: Self-pay

## 2019-06-11 ENCOUNTER — Encounter: Payer: Self-pay | Admitting: Cardiology

## 2019-06-11 VITALS — BP 125/65 | HR 69 | Ht 68.5 in | Wt 178.2 lb

## 2019-06-11 DIAGNOSIS — R42 Dizziness and giddiness: Secondary | ICD-10-CM

## 2019-06-11 DIAGNOSIS — I10 Essential (primary) hypertension: Secondary | ICD-10-CM

## 2019-06-11 DIAGNOSIS — I493 Ventricular premature depolarization: Secondary | ICD-10-CM | POA: Diagnosis not present

## 2019-06-11 NOTE — Progress Notes (Signed)
Primary Physician:  Lyman Bishop, DO   Patient ID: Shane Knight, male    DOB: [birth year], 85 y.o.   MRN: [id number]  Subjective:    Chief Complaint  Patient presents with  . Hypertension    frequent PVCs  . Follow-up    3MTH   This visit type was conducted due to national recommendations for restrictions regarding the COVID-19 Pandemic (e.g. social distancing).  This format is felt to be most appropriate for this patient at this time.  All issues noted in this document were discussed and addressed.  No physical exam was performed (except for noted visual exam findings with Telehealth visits).  The patient has consented to conduct a Telehealth visit and understands insurance will be billed.   I discussed the limitations of evaluation and management by telemedicine and the availability of in person appointments. The patient expressed understanding and agreed to proceed.  Virtual Visit via Video Note is as below  I connected with Mr. Gudiel, on 06/11/19 at 1335 by telephone and verified that I am speaking with the correct person using two identifiers. Unable to perform video visit as patient did not have equipment.    I have discussed with the patient regarding the safety during COVID Pandemic and steps and precautions including social distancing with the patient.    HPI: Shane Knight  is a 84 y.o. male  with asymptomatic small 3 cm abdominal aortic aneurysm detected by duplex on 05/23/2017 while evaluating back pain; however, was not noted on recent abdominal aortic duplex. Also has past medical history of stage III chronic kidney disease, chronic back pain, hypertension, and abnormal EKG.   Echo in July 2019 was essentially unchanged compared to August of 2018 except for new mild RV dilation and mildly increased PA pressure.   Patient was last seen 3 month ago, blood pressure had improved with addition of amlodipine; however, dose was reduced due to dizziness with position  changes. Dizziness now has resolved. No syncope or falls. He is compliant with support stockings daily that helps his venous insufficiency. Leg swelling is stable. Has chronic, frequent PVC's that are asymptomatic and stable.  No complaints today, states that he is doing well.   Past Medical History:  Diagnosis Date  . AAA (abdominal aortic aneurysm) ([place])   . Chronic kidney disease   . Hyperlipemia   . Hypertension   . PVC (premature ventricular contraction)     Past Surgical History:  Procedure Laterality Date  . APPENDECTOMY    . EYE SURGERY     lens implants  . HERNIA REPAIR    . TENNIS ELBOW RELEASE/NIRSCHEL PROCEDURE    . TONSILLECTOMY    . TOTAL SHOULDER REPLACEMENT Bilateral     Social History   Socioeconomic History  . Marital status: Widowed    Spouse name: Not on file  . Number of children: 3  . Years of education: Not on file  . Highest education level: Not on file  Occupational History  . Not on file  Social Needs  . Financial resource strain: Not on file  . Food insecurity    Worry: Not on file    Inability: Not on file  . Transportation needs    Medical: Not on file    Non-medical: Not on file  Tobacco Use  . Smoking status: Former Research scientist (life sciences)  . Smokeless tobacco: Never Used  Substance and Sexual Activity  . Alcohol use: Not on file  . Drug use: Not  on file  . Sexual activity: Not on file  Lifestyle  . Physical activity    Days per week: Not on file    Minutes per session: Not on file  . Stress: Not on file  Relationships  . Social Herbalist on phone: Not on file    Gets together: Not on file    Attends religious service: Not on file    Active member of club or organization: Not on file    Attends meetings of clubs or organizations: Not on file    Relationship status: Not on file  . Intimate partner violence    Fear of current or ex partner: Not on file    Emotionally abused: Not on file    Physically abused: Not on file    Forced  sexual activity: Not on file  Other Topics Concern  . Not on file  Social History Narrative  . Not on file    Review of Systems  Constitution: Negative for decreased appetite, malaise/fatigue, weight gain and weight loss.  Eyes: Negative for visual disturbance.  Cardiovascular: Negative for chest pain, claudication, dyspnea on exertion, leg swelling, orthopnea, palpitations and syncope.  Respiratory: Negative for hemoptysis and wheezing.   Endocrine: Negative for cold intolerance and heat intolerance.  Hematologic/Lymphatic: Does not bruise/bleed easily.  Skin: Negative for nail changes.  Musculoskeletal: Negative for muscle weakness and myalgias.  Gastrointestinal: Negative for abdominal pain, change in bowel habit, nausea and vomiting.  Neurological: Negative for difficulty with concentration, dizziness, focal weakness and headaches.  Psychiatric/Behavioral: Negative for altered mental status and suicidal ideas.  All other systems reviewed and are negative.     Objective:  Blood pressure 125/65, pulse 69, height 5' 8.5" (1.74 m), weight 178 lb 3.2 oz (80.8 kg). Body mass index is 26.7 kg/m.    Physical exam not performed or limited due to virtual visit.   Please see exam details from prior visit is as below.   Physical Exam  Constitutional: He is oriented to person, place, and time. Vital signs are normal. He appears well-developed and well-nourished.  HENT:  Head: Normocephalic and atraumatic.  Neck: Normal range of motion.  Cardiovascular: Normal rate, regular rhythm, normal heart sounds and intact distal pulses.  Extrasystoles are present.  Pulses:      Radial pulses are 2+ on the right side and 2+ on the left side.  Pulmonary/Chest: Effort normal and breath sounds normal. No accessory muscle usage. No respiratory distress.  Abdominal: Soft. Bowel sounds are normal.  Musculoskeletal: Normal range of motion.  Neurological: He is alert and oriented to person, place, and  time.  Skin: Skin is warm and dry.  Vitals reviewed.  Radiology: No results found.  Laboratory examination:   03/27/2018: CBC normal. Creatinine 1.4.  EGFR 48, potassium 4.3, CMP otherwise normal.  TSH 3.9.  Labs 07/28/2017: Total cholesterol 168, triglycerides 99, HDL 57, LDL 91.  CMP Latest Ref Rng & Units 03/30/2010 09/12/2008 09/10/2008  Glucose 70 - 99 mg/dL 117(H) 118(H) 123(H)  BUN 6 - 23 mg/dL 26(H) 20 27(H)  Creatinine 0.4 - 1.5 mg/dL 1.38 1.13 1.25  Sodium 135 - 145 mEq/L 139 142 140  Potassium 3.5 - 5.1 mEq/L 4.1 4.6 HEMOLYZED SPECIMEN, RESULTS MAY BE AFFECTED 4.0  Chloride 96 - 112 mEq/L 102 101 103  CO2 19 - 32 mEq/L 28 30 28   Calcium 8.4 - 10.5 mg/dL 9.6 10.4 9.9  Total Protein 6.0 - 8.3 g/dL 6.7 7.1 -  Total Bilirubin 0.3 - 1.2 mg/dL 0.9 1.3(H) -  Alkaline Phos 39 - 117 U/L 67 60 -  AST 0 - 37 U/L 20 24 -  ALT 0 - 53 U/L 20 17 -   CBC Latest Ref Rng & Units 03/30/2010 09/10/2008  WBC 4.0 - 10.5 K/uL 8.1 10.0  Hemoglobin 13.0 - 17.0 g/dL 16.1 15.6  Hematocrit 39.0 - 52.0 % 46.5 46.6  Platelets 150 - 400 K/uL 196 215   Lipid Panel  No results found for: CHOL, TRIG, HDL, CHOLHDL, VLDL, LDLCALC, LDLDIRECT HEMOGLOBIN A1C No results found for: HGBA1C, MPG TSH No results for input(s): TSH in the last 8760 hours.  PRN Meds:. Medications Discontinued During This Encounter  Medication Reason  . latanoprost (XALATAN) 0.005 % ophthalmic solution Error   Current Meds  Medication Sig  . acetaminophen (TYLENOL) 325 MG tablet Take 650 mg by mouth every 6 (six) hours as needed.  Marland Kitchen amLODipine (NORVASC) 2.5 MG tablet Take 1 tablet (2.5 mg total) by mouth daily.  Marland Kitchen latanoprost (XALATAN) 0.005 % ophthalmic solution Place 1 drop into both eyes daily.  Marland Kitchen lisinopril-hydrochlorothiazide (PRINZIDE,ZESTORETIC) 10-12.5 MG tablet Take 1 tablet by mouth daily for 30 days.  . Multiple Vitamins-Minerals (MULTIVITAMIN WITH MINERALS) tablet Take 1 tablet by mouth daily.  . Multiple  Vitamins-Minerals (PRESERVISION AREDS 2+MULTI VIT PO) Take 2 capsules by mouth daily.  . pregabalin (LYRICA) 100 MG capsule Take 100 mg by mouth 2 (two) times daily after a meal.    Cardiac Studies:   Echocardiogram 06/27/2018: Left ventricle cavity is normal in size. Mild concentric hypertrophy of the left ventricle. Wall motion probably normal. Accurate assessment of LVEF and diastolic function limited due to multiple PVC's, ?Afib. Calculated EF 54%. Left atrial cavity is mildly dilated. Right atrial cavity is mildly dilated. Right ventricle cavity is mildly dilated. Normal right ventricular function. Moderate tricuspid regurgitation. Estimated pulmonary artery systolic pressure 43 mmHg. Mild to moderate pulmonic regurgitation. Compared to previous study on 07/27/2018, mild RV dilatation is new. PASP mildly increased.  Carotid artery duplex 07/27/2017: No hemodynamically significant arterial disease in the internal carotid artery bilaterally. Minimal soft plaque noted. Antegrade right vertebral artery flow. Antegrade left vertebral artery flow.  Abdominal aortic duplex 12/18/2018: The maximum aorta diameter is 2.1 cm (mid). No evidence of atherosclerotic plaque. Normal flow velocities noted.  No AAA noted.  Compared to outside study 05/23/2017:  Proximal abdominal aneurysm measuring 3.1 cm x 3.1 cm, moderate dilatation of the left common iliac artery measuring 1.6 cm.  Normal iliac artery velocity.  Assessment:     ICD-10-CM   1. Essential hypertension  I10   2. PVC (premature ventricular contraction)  I49.3   3. Dizziness  R42     EKG 01/29/2019: Sinus rhythm at 78 bpm with first degree AV block with frequent PAC's and 1 PVC. Left atrial enlargement, left axis deviation, left anterior fasicular block, IRBBB. Nonspecific T abnormality.  Recommendations:   Patient is presently doing well, no complaints today.  Dizziness has resolved with decreasing his dose of amlodipine.   Fortunately blood pressure has remained stable with this.  We will continue the same.  He has history of frequent PVCs that are asymptomatic.  No evidence of PVC induced cardiomyopathy by echocardiogram in July 2019. He is to see his PCP in the next 1 to 2 weeks for follow-up.  I do not have recent labs, if he has not had recent CBC, CMP, and lipids, would recommend this and will request  for our records.  Overall, patient is doing well despite his advanced age and remains fairly active.  I will see him back in 6 months or sooner if needed.  Miquel Dunn, MSN, APRN, FNP-C [facility]. [place] Office: [phone] Fax: [phone]

## 2019-06-18 ENCOUNTER — Other Ambulatory Visit: Payer: Self-pay

## 2019-06-18 MED ORDER — AMLODIPINE BESYLATE 2.5 MG PO TABS: 2.5000 mg | ORAL_TABLET | Freq: Every day | ORAL | 1 refills | Status: DC

## 2019-07-22 ENCOUNTER — Encounter (HOSPITAL_BASED_OUTPATIENT_CLINIC_OR_DEPARTMENT_OTHER): Payer: Self-pay | Admitting: Emergency Medicine

## 2019-07-22 ENCOUNTER — Other Ambulatory Visit: Payer: Self-pay

## 2019-07-22 ENCOUNTER — Emergency Department (HOSPITAL_BASED_OUTPATIENT_CLINIC_OR_DEPARTMENT_OTHER): Payer: Medicare Other

## 2019-07-22 ENCOUNTER — Emergency Department (HOSPITAL_BASED_OUTPATIENT_CLINIC_OR_DEPARTMENT_OTHER)
Admission: EM | Admit: 2019-07-22 | Discharge: 2019-07-22 | Disposition: A | Discharge status: Home / Self Care | Payer: Medicare Other | Attending: Emergency Medicine | Admitting: Emergency Medicine

## 2019-07-22 DIAGNOSIS — I129 Hypertensive chronic kidney disease with stage 1 through stage 4 chronic kidney disease, or unspecified chronic kidney disease: Secondary | ICD-10-CM | POA: Insufficient documentation

## 2019-07-22 DIAGNOSIS — I714 Abdominal aortic aneurysm, without rupture, unspecified: Secondary | ICD-10-CM

## 2019-07-22 DIAGNOSIS — M545 Low back pain, unspecified: Secondary | ICD-10-CM

## 2019-07-22 DIAGNOSIS — N2889 Other specified disorders of kidney and ureter: Secondary | ICD-10-CM | POA: Diagnosis not present

## 2019-07-22 DIAGNOSIS — N183 Chronic kidney disease, stage 3 (moderate): Secondary | ICD-10-CM | POA: Insufficient documentation

## 2019-07-22 DIAGNOSIS — I880 Nonspecific mesenteric lymphadenitis: Secondary | ICD-10-CM | POA: Insufficient documentation

## 2019-07-22 DIAGNOSIS — Z79899 Other long term (current) drug therapy: Secondary | ICD-10-CM | POA: Diagnosis not present

## 2019-07-22 DIAGNOSIS — N289 Disorder of kidney and ureter, unspecified: Secondary | ICD-10-CM

## 2019-07-22 LAB — CBC WITH DIFFERENTIAL/PLATELET
Abs Immature Granulocytes: 0.02 10*3/uL (ref 0.00–0.07)
Basophils Absolute: 0 10*3/uL (ref 0.0–0.1)
Basophils Relative: 0 %
Eosinophils Absolute: 0.4 10*3/uL (ref 0.0–0.5)
Eosinophils Relative: 4 %
HCT: 45.6 % (ref 39.0–52.0)
Hemoglobin: 14.9 g/dL (ref 13.0–17.0)
Immature Granulocytes: 0 %
Lymphocytes Relative: 19 %
Lymphs Abs: 1.9 10*3/uL (ref 0.7–4.0)
MCH: 30.6 pg (ref 26.0–34.0)
MCHC: 32.7 g/dL (ref 30.0–36.0)
MCV: 93.6 fL (ref 80.0–100.0)
Monocytes Absolute: 1.1 10*3/uL — ABNORMAL HIGH (ref 0.1–1.0)
Monocytes Relative: 11 %
Neutro Abs: 6.6 10*3/uL (ref 1.7–7.7)
Neutrophils Relative %: 66 %
Platelets: 166 10*3/uL (ref 150–400)
RBC: 4.87 MIL/uL (ref 4.22–5.81)
RDW: 14 % (ref 11.5–15.5)
WBC: 10 10*3/uL (ref 4.0–10.5)
nRBC: 0 % (ref 0.0–0.2)

## 2019-07-22 LAB — URINALYSIS, ROUTINE W REFLEX MICROSCOPIC
Bilirubin Urine: NEGATIVE
Glucose, UA: NEGATIVE mg/dL
Hgb urine dipstick: NEGATIVE
Ketones, ur: NEGATIVE mg/dL
Leukocytes,Ua: NEGATIVE
Nitrite: NEGATIVE
Protein, ur: NEGATIVE mg/dL
Specific Gravity, Urine: 1.01 (ref 1.005–1.030)
pH: 7 (ref 5.0–8.0)

## 2019-07-22 LAB — BASIC METABOLIC PANEL
Anion gap: 12 (ref 5–15)
BUN: 19 mg/dL (ref 8–23)
CO2: 25 mmol/L (ref 22–32)
Calcium: 9.6 mg/dL (ref 8.9–10.3)
Chloride: 101 mmol/L (ref 98–111)
Creatinine, Ser: 1.17 mg/dL (ref 0.61–1.24)
GFR calc Af Amer: >60 mL/min (ref >60)
GFR calc non Af Amer: 54 mL/min — ABNORMAL LOW (ref >60)
Glucose, Bld: 112 mg/dL — ABNORMAL HIGH (ref 70–99)
Potassium: 3.7 mmol/L (ref 3.5–5.1)
Sodium: 138 mmol/L (ref 135–145)

## 2019-07-22 MED ORDER — LIDOCAINE 5 % EX PTCH: 1.0000 | MEDICATED_PATCH | CUTANEOUS | 0 refills | Status: DC

## 2019-07-22 MED ORDER — ACETAMINOPHEN 500 MG PO TABS: 1000.0000 mg | ORAL_TABLET | Freq: Once | ORAL | Status: DC

## 2019-07-22 MED ORDER — KETOROLAC TROMETHAMINE 30 MG/ML IJ SOLN
15.0000 mg | Freq: Once | INTRAMUSCULAR | Status: AC
  Administered 2019-07-22: 15 mg via INTRAMUSCULAR
  Filled 2019-07-22: qty 1

## 2019-07-22 MED ORDER — KETOROLAC TROMETHAMINE 30 MG/ML IJ SOLN: 15.0000 mg | Freq: Once | INTRAMUSCULAR | Status: DC

## 2019-07-22 MED ORDER — METRONIDAZOLE 500 MG PO TABS: 500.0000 mg | ORAL_TABLET | Freq: Two times a day (BID) | ORAL | 0 refills | Status: DC

## 2019-07-22 NOTE — ED Notes (Signed)
ED Provider at bedside. 

## 2019-07-22 NOTE — ED Triage Notes (Signed)
Pt states that the lower right back started hurting yesterday when he was sitting on the porch watching his kids paint his house. States it burns, does not radiate, worse with movement, and it burns when he starts to urinate.

## 2019-07-22 NOTE — ED Provider Notes (Addendum)
[place] EMERGENCY DEPARTMENT Provider Note   CSN: [id number] Arrival date & time: 07/22/19  [id number]     History   Chief Complaint Chief Complaint  Patient presents with  . Back Pain    HPI Shane Knight is a 85 y.o. male.     The history is provided by the patient.  Back Pain Location:  Sacro-iliac joint Quality:  Stabbing Radiates to:  Does not radiate Pain severity:  Severe Pain is:  Same all the time Onset quality:  Sudden Duration:  20 hours Timing:  Constant Progression:  Unchanged Chronicity:  New Context: not emotional stress, not falling, not jumping from heights, not lifting heavy objects, not MCA, not MVA, not occupational injury, not pedestrian accident, not physical stress, not recent illness, not recent injury and not twisting   Relieved by:  Nothing Worsened by:  Nothing Ineffective treatments:  None tried Associated symptoms: no abdominal pain, no abdominal swelling, no bladder incontinence, no bowel incontinence, no chest pain, no dysuria, no fever, no headaches, no leg pain, no numbness, no paresthesias, no pelvic pain, no perianal numbness, no tingling, no weakness and no weight loss   Risk factors: no hx of cancer   Patient sitting in a chair watching house painting yesterday and developed severe low back pain. No radiation.  No rash.  No weakness.  No numbness.  Hurts when he starts to urinate.  No trauma.     Past Medical History:  Diagnosis Date  . AAA (abdominal aortic aneurysm) ([place])   . Chronic kidney disease   . Hyperlipemia   . Hypertension   . PVC (premature ventricular contraction)     Patient Active Problem List   Diagnosis Date Noted  . PVC (premature ventricular contraction)   . Frequent PVCs 01/29/2019  . CKD (chronic kidney disease) stage 3, GFR 30-59 ml/min ([facility]) 01/29/2019  . Essential hypertension 01/29/2019  . Abdominal aneurysm ([place]) 01/29/2019    Past Surgical History:  Procedure Laterality Date  .  APPENDECTOMY    . EYE SURGERY     lens implants  . HERNIA REPAIR    . TENNIS ELBOW RELEASE/NIRSCHEL PROCEDURE    . TONSILLECTOMY    . TOTAL SHOULDER REPLACEMENT Bilateral         Home Medications    Prior to Admission medications   Medication Sig Start Date End Date Taking? Authorizing Provider  amLODipine (NORVASC) 2.5 MG tablet Take 1 tablet (2.5 mg total) by mouth daily. 06/18/19 09/16/19 Yes Adrian Prows, MD  lisinopril-hydrochlorothiazide (PRINZIDE,ZESTORETIC) 10-12.5 MG tablet Take 1 tablet by mouth daily for 30 days. 01/29/19 07/22/19 Yes Miquel Dunn, NP  Multiple Vitamins-Minerals (MULTIVITAMIN WITH MINERALS) tablet Take 1 tablet by mouth daily.   Yes [provider]  Multiple Vitamins-Minerals (PRESERVISION AREDS 2+MULTI VIT PO) Take 2 capsules by mouth daily.   Yes [provider]  pregabalin (LYRICA) 100 MG capsule Take 100 mg by mouth 2 (two) times daily after a meal. 09/05/15  Yes [provider]  acetaminophen (TYLENOL) 325 MG tablet Take 650 mg by mouth every 6 (six) hours as needed.    [provider]  latanoprost (XALATAN) 0.005 % ophthalmic solution Place 1 drop into both eyes daily. 01/24/19   [provider]    Family History History reviewed. No pertinent family history.  Social History Social History   Tobacco Use  . Smoking status: Former Research scientist (life sciences)  . Smokeless tobacco: Never Used  Substance Use Topics  . Alcohol use:  Not on file  . Drug use: Not on file     Allergies   Tape   Review of Systems Review of Systems  Constitutional: Negative for fever and weight loss.  HENT: Negative for congestion.   Eyes: Negative for visual disturbance.  Respiratory: Negative for shortness of breath.   Cardiovascular: Negative for chest pain.  Gastrointestinal: Negative for abdominal pain and bowel incontinence.  Genitourinary: Negative for bladder incontinence, dysuria and pelvic pain.  Musculoskeletal: Positive  for back pain. Negative for gait problem, joint swelling and myalgias.  Skin: Negative for rash.  Neurological: Negative for tingling, weakness, numbness, headaches and paresthesias.  Psychiatric/Behavioral: Negative for agitation.  All other systems reviewed and are negative.    Physical Exam Updated Vital Signs BP (!) 169/93   Pulse (!) 110   Temp 98.5 F (36.9 C) (Oral)   Resp 20   Ht 5\' 8"  (1.727 m)   Wt 81.6 kg   SpO2 93%   BMI 27.37 kg/m   Physical Exam Vitals signs and nursing note reviewed.  Constitutional:      General: He is not in acute distress. HENT:     Head: Normocephalic and atraumatic.     Nose: Nose normal.     Mouth/Throat:     Mouth: Mucous membranes are moist.     Pharynx: Oropharynx is clear.  Eyes:     Conjunctiva/sclera: Conjunctivae normal.     Pupils: Pupils are equal, round, and reactive to light.  Neck:     Musculoskeletal: Neck supple.  Cardiovascular:     Rate and Rhythm: Normal rate and regular rhythm.     Pulses: Normal pulses.     Heart sounds: Normal heart sounds.  Pulmonary:     Effort: Pulmonary effort is normal.     Breath sounds: Normal breath sounds.  Abdominal:     General: Abdomen is flat. Bowel sounds are normal.     Tenderness: There is no abdominal tenderness. There is no guarding or rebound.  Musculoskeletal: Normal range of motion.        General: No tenderness.     Left hip: Normal.     Lumbar back: Normal.  Skin:    General: Skin is warm and dry.     Capillary Refill: Capillary refill takes less than 2 seconds.  Neurological:     General: No focal deficit present.     Mental Status: He is alert and oriented to person, place, and time.  Psychiatric:        Mood and Affect: Mood normal.        Behavior: Behavior normal.      ED Treatments / Results  Labs (all labs ordered are listed, but only abnormal results are displayed) Results for orders placed or performed during the hospital encounter of 07/22/19   Urinalysis, Routine w reflex microscopic  Result Value Ref Range   Color, Urine YELLOW YELLOW   APPearance CLEAR CLEAR   Specific Gravity, Urine 1.010 1.005 - 1.030   pH 7.0 5.0 - 8.0   Glucose, UA NEGATIVE NEGATIVE mg/dL   Hgb urine dipstick NEGATIVE NEGATIVE   Bilirubin Urine NEGATIVE NEGATIVE   Ketones, ur NEGATIVE NEGATIVE mg/dL   Protein, ur NEGATIVE NEGATIVE mg/dL   Nitrite NEGATIVE NEGATIVE   Leukocytes,Ua NEGATIVE NEGATIVE  CBC with Differential/Platelet  Result Value Ref Range   WBC 10.0 4.0 - 10.5 K/uL   RBC 4.87 4.22 - 5.81 MIL/uL   Hemoglobin 14.9 13.0 - 17.0 g/dL  HCT 45.6 39.0 - 52.0 %   MCV 93.6 80.0 - 100.0 fL   MCH 30.6 26.0 - 34.0 pg   MCHC 32.7 30.0 - 36.0 g/dL   RDW 14.0 11.5 - 15.5 %   Platelets 166 150 - 400 K/uL   nRBC 0.0 0.0 - 0.2 %   Neutrophils Relative % 66 %   Neutro Abs 6.6 1.7 - 7.7 K/uL   Lymphocytes Relative 19 %   Lymphs Abs 1.9 0.7 - 4.0 K/uL   Monocytes Relative 11 %   Monocytes Absolute 1.1 (H) 0.1 - 1.0 K/uL   Eosinophils Relative 4 %   Eosinophils Absolute 0.4 0.0 - 0.5 K/uL   Basophils Relative 0 %   Basophils Absolute 0.0 0.0 - 0.1 K/uL   Immature Granulocytes 0 %   Abs Immature Granulocytes 0.02 0.00 - 0.07 K/uL  Basic metabolic panel  Result Value Ref Range   Sodium 138 135 - 145 mmol/L   Potassium 3.7 3.5 - 5.1 mmol/L   Chloride 101 98 - 111 mmol/L   CO2 25 22 - 32 mmol/L   Glucose, Bld 112 (H) 70 - 99 mg/dL   BUN 19 8 - 23 mg/dL   Creatinine, Ser 1.17 0.61 - 1.24 mg/dL   Calcium 9.6 8.9 - 10.3 mg/dL   GFR calc non Af Amer 54 (L) >60 mL/min   GFR calc Af Amer >60 >60 mL/min   Anion gap 12 5 - 15   No results found.  Radiology No results found.  Procedures Procedures (including critical care time)  Medications Ordered in ED Medications  ketorolac (TORADOL) 30 MG/ML injection 15 mg (has no administration in time range)   Pain in back is MSK in nature.  Doing better with medication.  Will prescribe NSAIDs and  lidoderm patches.  Informed of need for follow up with aneurysm which is not today's problem.  Have informed patient of renal lesion and need for follow up MRI as an outpatient.  Patient has no symptoms of enteritis at the this time. I do not believe the inflamed lymph nodes are the cause of the patient's pain.   Will treat withflagyl and have advised close follow up.  Patient verbalizes understanding and agrees to follow up  The only other consideration would be shingles and I have advised the patient of this but there is no rash at this time and the patient is taking lyrica.     Shane Knight was evaluated in Emergency Department on 07/22/2019 for the symptoms described in the history of present illness. He was evaluated in the context of the global COVID-19 pandemic, which necessitated consideration that the patient might be at risk for infection with the SARS-CoV-2 virus that causes COVID-19. Institutional protocols and algorithms that pertain to the evaluation of patients at risk for COVID-19 are in a state of rapid change based on information released by regulatory bodies including the CDC and federal and state organizations. These policies and algorithms were followed during the patient's care in the ED.  Final Clinical Impressions(s) / ED Diagnoses   Return for intractable cough, coughing up blood,fevers >100.4 unrelieved by medication, shortness of breath, intractable vomiting, chest pain, shortness of breath, weakness,numbness, changes in speech, facial asymmetry,abdominal pain, passing out,Inability to tolerate liquids or food, cough, altered mental status or any concerns. No signs of systemic illness or infection. The patient is nontoxic-appearing on exam and vital signs are within normal limits.   I have reviewed the triage vital signs  and the nursing notes. Pertinent labs &imaging results that were available during my care of the patient were reviewed by me and considered in my  medical decision making (see chart for details).  After history, exam, and medical workup I feel the patient has been appropriately medically screened and is safe for discharge home. Pertinent diagnoses were discussed with the patient. Patient was given return precautions   Tashiya Souders, MD 07/22/19 [place], Ivylynn Hoppes, MD 07/22/19 [id number]

## 2019-07-22 NOTE — ED Notes (Signed)
Reports taking a couple tylenol around 0430 this morning, reporting relief.

## 2019-07-23 LAB — URINE CULTURE: Culture: <10000 — AB

## 2019-08-13 ENCOUNTER — Other Ambulatory Visit (HOSPITAL_BASED_OUTPATIENT_CLINIC_OR_DEPARTMENT_OTHER): Payer: Self-pay | Admitting: Family Medicine

## 2019-08-13 DIAGNOSIS — N289 Disorder of kidney and ureter, unspecified: Secondary | ICD-10-CM

## 2019-08-16 ENCOUNTER — Other Ambulatory Visit: Payer: Self-pay

## 2019-08-16 ENCOUNTER — Ambulatory Visit (HOSPITAL_BASED_OUTPATIENT_CLINIC_OR_DEPARTMENT_OTHER)
Admission: RE | Admit: 2019-08-16 | Discharge: 2019-08-16 | Disposition: A | Discharge status: Home / Self Care | Payer: Medicare Other | Source: Ambulatory Visit | Attending: Family Medicine | Admitting: Family Medicine

## 2019-08-16 ENCOUNTER — Encounter (HOSPITAL_BASED_OUTPATIENT_CLINIC_OR_DEPARTMENT_OTHER): Payer: Self-pay

## 2019-08-16 DIAGNOSIS — N289 Disorder of kidney and ureter, unspecified: Secondary | ICD-10-CM | POA: Diagnosis present

## 2019-08-16 MED ORDER — IOHEXOL 350 MG/ML SOLN
100.0000 mL | Freq: Once | INTRAVENOUS | Status: AC | PRN
  Administered 2019-08-16: 100 mL via INTRAVENOUS

## 2019-08-21 ENCOUNTER — Other Ambulatory Visit: Payer: Self-pay

## 2019-08-21 MED ORDER — AMLODIPINE BESYLATE 2.5 MG PO TABS: 2.5000 mg | ORAL_TABLET | Freq: Every day | ORAL | 1 refills | Status: DC

## 2019-08-22 ENCOUNTER — Other Ambulatory Visit: Payer: Self-pay

## 2019-08-22 ENCOUNTER — Encounter (HOSPITAL_BASED_OUTPATIENT_CLINIC_OR_DEPARTMENT_OTHER): Payer: Self-pay | Admitting: Emergency Medicine

## 2019-08-22 ENCOUNTER — Emergency Department (HOSPITAL_BASED_OUTPATIENT_CLINIC_OR_DEPARTMENT_OTHER): Payer: Medicare Other

## 2019-08-22 ENCOUNTER — Emergency Department (HOSPITAL_BASED_OUTPATIENT_CLINIC_OR_DEPARTMENT_OTHER)
Admission: EM | Admit: 2019-08-22 | Discharge: 2019-08-22 | Disposition: A | Discharge status: Home / Self Care | Payer: Medicare Other | Attending: Emergency Medicine | Admitting: Emergency Medicine

## 2019-08-22 DIAGNOSIS — Z79899 Other long term (current) drug therapy: Secondary | ICD-10-CM | POA: Diagnosis not present

## 2019-08-22 DIAGNOSIS — Z87891 Personal history of nicotine dependence: Secondary | ICD-10-CM | POA: Insufficient documentation

## 2019-08-22 DIAGNOSIS — I129 Hypertensive chronic kidney disease with stage 1 through stage 4 chronic kidney disease, or unspecified chronic kidney disease: Secondary | ICD-10-CM | POA: Insufficient documentation

## 2019-08-22 DIAGNOSIS — W01190A Fall on same level from slipping, tripping and stumbling with subsequent striking against furniture, initial encounter: Secondary | ICD-10-CM | POA: Insufficient documentation

## 2019-08-22 DIAGNOSIS — N183 Chronic kidney disease, stage 3 (moderate): Secondary | ICD-10-CM | POA: Insufficient documentation

## 2019-08-22 DIAGNOSIS — S0101XA Laceration without foreign body of scalp, initial encounter: Secondary | ICD-10-CM | POA: Insufficient documentation

## 2019-08-22 DIAGNOSIS — Y92008 Other place in unspecified non-institutional (private) residence as the place of occurrence of the external cause: Secondary | ICD-10-CM | POA: Insufficient documentation

## 2019-08-22 DIAGNOSIS — Y9389 Activity, other specified: Secondary | ICD-10-CM | POA: Insufficient documentation

## 2019-08-22 DIAGNOSIS — Y999 Unspecified external cause status: Secondary | ICD-10-CM | POA: Insufficient documentation

## 2019-08-22 DIAGNOSIS — S0990XA Unspecified injury of head, initial encounter: Secondary | ICD-10-CM | POA: Diagnosis present

## 2019-08-22 DIAGNOSIS — W19XXXA Unspecified fall, initial encounter: Secondary | ICD-10-CM

## 2019-08-22 MED ORDER — LIDOCAINE-EPINEPHRINE (PF) 2 %-1:200000 IJ SOLN
INTRAMUSCULAR | Status: AC
  Administered 2019-08-22: 20 mL
  Filled 2019-08-22: qty 20

## 2019-08-22 MED ORDER — LIDOCAINE-EPINEPHRINE (PF) 2 %-1:200000 IJ SOLN
20.0000 mL | Freq: Once | INTRAMUSCULAR | Status: AC
  Administered 2019-08-22: 18:00:00 20 mL
  Filled 2019-08-22: qty 20

## 2019-08-22 NOTE — ED Provider Notes (Signed)
[facility] EMERGENCY DEPARTMENT Provider Note   CSN: [id number] Arrival date & time: 08/22/19  1555     History   Chief Complaint Chief Complaint  Patient presents with   Fall    HPI Shane Knight is a 85 y.o. male presenting for evaluation after fall.  Patient states he was in his workshop when he lost his balance due to changing direction too quickly.  He fell and hit his head on a workbench made of wood.  He denies loss of consciousness.  He denies injury elsewhere.  He was able to ambulate back to his house without difficulty.  He reports some pain in his head, but no pain elsewhere.  He denies vision changes, slurred speech, neck pain, back pain, nausea, vomiting, numbness, or tingling.  He is not on blood thinners.     HPI  Past Medical History:  Diagnosis Date   AAA (abdominal aortic aneurysm) ([place])    Chronic kidney disease    Hyperlipemia    Hypertension    PVC (premature ventricular contraction)     Patient Active Problem List   Diagnosis Date Noted   PVC (premature ventricular contraction)    Frequent PVCs 01/29/2019   CKD (chronic kidney disease) stage 3, GFR 30-59 ml/min ([place]) 01/29/2019   Essential hypertension 01/29/2019   Abdominal aneurysm ([place]) 01/29/2019    Past Surgical History:  Procedure Laterality Date   APPENDECTOMY     EYE SURGERY     lens implants   HERNIA REPAIR     TENNIS ELBOW RELEASE/NIRSCHEL PROCEDURE     TONSILLECTOMY     TOTAL SHOULDER REPLACEMENT Bilateral         Home Medications    Prior to Admission medications   Medication Sig Start Date End Date Taking? Authorizing Provider  acetaminophen (TYLENOL) 325 MG tablet Take 650 mg by mouth every 6 (six) hours as needed.    [provider]  amLODipine (NORVASC) 2.5 MG tablet Take 1 tablet (2.5 mg total) by mouth daily. 08/21/19 11/19/19  Miquel Dunn, NP  latanoprost (XALATAN) 0.005 % ophthalmic solution Place 1 drop into both eyes  daily. 01/24/19   [provider]  lidocaine (LIDODERM) 5 % Place 1 patch onto the skin daily. Remove & Discard patch within 12 hours or as directed by MD 07/22/19   Randal Buba, April, MD  lisinopril-hydrochlorothiazide (PRINZIDE,ZESTORETIC) 10-12.5 MG tablet Take 1 tablet by mouth daily for 30 days. 01/29/19 07/22/19  Miquel Dunn, NP  metroNIDAZOLE (FLAGYL) 500 MG tablet Take 1 tablet (500 mg total) by mouth 2 (two) times daily. One po bid x 7 days 07/22/19   Palumbo, April, MD  Multiple Vitamins-Minerals (MULTIVITAMIN WITH MINERALS) tablet Take 1 tablet by mouth daily.    [provider]  Multiple Vitamins-Minerals (PRESERVISION AREDS 2+MULTI VIT PO) Take 2 capsules by mouth daily.    [provider]  pregabalin (LYRICA) 100 MG capsule Take 100 mg by mouth 2 (two) times daily after a meal. 09/05/15   [provider]    Family History History reviewed. No pertinent family history.  Social History Social History   Tobacco Use   Smoking status: Former Smoker   Smokeless tobacco: Never Used  Substance Use Topics   Alcohol use: Not on file   Drug use: Not on file     Allergies   Tape   Review of Systems Review of Systems  Skin: Positive for wound.  All other systems reviewed and are negative.  Physical Exam Updated Vital Signs BP 129/62 (BP Location: Right Arm)    Pulse 79    Temp 98.1 F (36.7 C) (Oral)    Resp 14    Ht 5\' 8"  (1.727 m)    Wt 80.7 kg    SpO2 100%    BMI 27.06 kg/m   Physical Exam Vitals signs and nursing note reviewed.  Constitutional:      General: He is not in acute distress.    Appearance: He is well-developed.     Comments: Elderly male resting comfortably in the bed in no acute distress  HENT:     Head: Normocephalic.      Comments: 3 cm head lac without active bleeding.  No tenderness palpation elsewhere in the skull.  No hemotympanum or nasal septal hematoma. Eyes:     Extraocular Movements: Extraocular  movements intact.     Conjunctiva/sclera: Conjunctivae normal.     Pupils: Pupils are equal, round, and reactive to light.     Comments: EOMI and PERRLA. No nystagmus  Neck:     Musculoskeletal: Normal range of motion and neck supple.     Comments: No tenderness palpation midline C-spine.  No step-offs or deformities.  Moving head without signs of pain. Cardiovascular:     Rate and Rhythm: Normal rate and regular rhythm.     Pulses: Normal pulses.  Pulmonary:     Effort: Pulmonary effort is normal. No respiratory distress.     Breath sounds: Normal breath sounds. No wheezing.  Abdominal:     General: There is no distension.     Palpations: Abdomen is soft. There is no mass.     Tenderness: There is no abdominal tenderness. There is no guarding or rebound.  Musculoskeletal: Normal range of motion.     Comments: Strength and sensation intact x4  Skin:    General: Skin is warm and dry.     Capillary Refill: Capillary refill takes less than 2 seconds.  Neurological:     Mental Status: He is alert and oriented to person, place, and time.      ED Treatments / Results  Labs (all labs ordered are listed, but only abnormal results are displayed) Labs Reviewed - No data to display  EKG None  Radiology Ct Head Wo Contrast  Result Date: 08/22/2019 CLINICAL DATA:  Head trauma. EXAM: CT HEAD WITHOUT CONTRAST TECHNIQUE: Contiguous axial images were obtained from the base of the skull through the vertex without intravenous contrast. COMPARISON:  04/28/2017 FINDINGS: Brain: Stable cerebral atrophy. No evidence for acute hemorrhage, mass lesion, midline shift, hydrocephalus or large infarct. Vascular: Atherosclerotic calcifications involving the vertebral arteries. Skull: Normal. Negative for fracture or focal lesion. Sinuses/Orbits: No acute finding. Other: None. IMPRESSION: No acute intracranial abnormality. Electronically Signed   By: Markus Daft M.D.   On: 08/22/2019 17:05   Ct Cervical  Spine Wo Contrast  Result Date: 08/22/2019 CLINICAL DATA:  C-spine trauma, tripped and fall EXAM: CT CERVICAL SPINE WITHOUT CONTRAST TECHNIQUE: Multidetector CT imaging of the cervical spine was performed without intravenous contrast. Multiplanar CT image reconstructions were also generated. COMPARISON:  None FINDINGS: Alignment: Normal. Skull base and vertebrae: No acute fracture. No primary bone lesion or focal pathologic process. Extensive degenerative changes throughout the spine. Soft tissues and spinal canal: No prevertebral fluid or swelling. No visible canal hematoma. Disc levels: Multilevel degenerative changes with complete loss of disc space at C3-C4 also with extensive facet disease at these levels. 1.4 mm of anterolisthesis  of L4 on L5. Facet degenerative changes seen posteriorly without signs of fracture 0 with extensive degenerative changes. Loss of the disc space also at C5-C6 and C6-C7. Upper chest: Negative. Other: Atherosclerotic calcifications in the carotid arteries worse on the right than the left. IMPRESSION: Marked spinal degenerative changes in the cervical spine, no signs of acute fracture Electronically Signed   By: Zetta Bills M.D.   On: 08/22/2019 17:43    Procedures .Marland KitchenLaceration Repair  Date/Time: 08/22/2019 6:21 PM Performed by: Franchot Heidelberg, PA-C Authorized by: Franchot Heidelberg, PA-C   Consent:    Consent obtained:  Verbal   Consent given by:  Patient   Risks discussed:  Infection, need for additional repair, nerve damage, poor wound healing, poor cosmetic result and pain Laceration details:    Location:  Scalp   Scalp location:  L parietal   Length (cm):  3   Depth (mm):  2 Repair type:    Repair type:  Simple Pre-procedure details:    Preparation:  Patient was prepped and draped in usual sterile fashion and imaging obtained to evaluate for foreign bodies Exploration:    Wound exploration: wound explored through full range of motion and entire depth  of wound probed and visualized     Wound extent: no underlying fracture noted   Treatment:    Area cleansed with:  Betadine   Amount of cleaning:  Standard Skin repair:    Repair method:  Sutures   Suture size:  5-0   Suture material:  Prolene   Suture technique:  Simple interrupted   Number of sutures:  4 Approximation:    Approximation:  Close Post-procedure details:    Dressing:  Open (no dressing)   Patient tolerance of procedure:  Tolerated well, no immediate complications   (including critical care time)  Medications Ordered in ED Medications  lidocaine-EPINEPHrine (XYLOCAINE W/EPI) 2 %-1:200000 (PF) injection 20 mL (20 mLs Infiltration Given 08/22/19 1759)     Initial Impression / Assessment and Plan / ED Course  I have reviewed the triage vital signs and the nursing notes.  Pertinent labs & imaging results that were available during my care of the patient were reviewed by me and considered in my medical decision making (see chart for details).        Patient presenting for evaluation after mechanical fall.  Physical examination, he appears nontoxic.  He is neurovascular intact.  However, considering his age and head laceration, will obtain CT head neck for further evaluation.  CT head and neck negative for acute findings.  Laceration repaired as described above.  Discussed aftercare instructions.  At this time, patient appears safe for discharge.  Return precautions given.  Patient states he understands and agrees to plan.   Final Clinical Impressions(s) / ED Diagnoses   Final diagnoses:  Fall, initial encounter  Laceration of scalp without foreign body, initial encounter    ED Discharge Orders    None       Franchot Heidelberg, PA-C 08/22/19 Overton Mam, MD 08/22/19 2009

## 2019-08-22 NOTE — ED Triage Notes (Signed)
Patient BIB [facility] for mechanical fall at home while working in his workshop, 4in laceration to left side of head, bandaged by EMS. No blood thinners, no LOC. Patient AOx4, NAD.

## 2019-08-22 NOTE — Discharge Instructions (Addendum)
1. Medications: Tylenol or ibuprofen for pain, usual home medications 2. Treatment: ice for swelling, keep wound clean with warm soap and water  3. Follow Up: Please return in 7 days to have your stitches removed or sooner if you have concerns. Return to the emergency department for increased redness, drainage of pus from the wound   WOUND CARE  Wash wound gently with mild soap and warm water. Reapply a new bandage after cleaning wound, if directed.   Continue daily cleansing with soap and water until stitches are removed.  Do not apply any ointments or creams to the wound while stitches are in place, as this may cause delayed healing. Return if you experience any of the following signs of infection: Swelling, redness, pus drainage, streaking, fever >101.0 F  Return if you experience excessive bleeding that does not stop after 15-20 minutes of constant, firm pressure.

## 2019-11-13 ENCOUNTER — Other Ambulatory Visit: Payer: Self-pay | Admitting: Cardiology

## 2019-12-11 ENCOUNTER — Ambulatory Visit: Payer: Medicare Other | Admitting: Cardiology

## 2019-12-14 ENCOUNTER — Ambulatory Visit: Payer: Medicare Other | Attending: Internal Medicine

## 2019-12-14 DIAGNOSIS — Z23 Encounter for immunization: Secondary | ICD-10-CM | POA: Insufficient documentation

## 2019-12-14 NOTE — Progress Notes (Signed)
   [id number]  Name:  Shane Kawczynski Sr.    MRN: [id number] DOB: [birth year]  12/14/2019  Mr. Shane Knight was observed post Covid-19 immunization for 15 minutes without incidence. He was provided with Vaccine Information Sheet and instruction to access the V-Safe system.   Mr. Shane Knight was instructed to call 911 with any severe reactions post vaccine: Marland Kitchen Difficulty breathing  . Swelling of your face and throat  . A fast heartbeat  . A bad rash all over your body  . Dizziness and weakness    Immunizations Administered    Name Date Dose VIS Date Route   Pfizer COVID-19 Vaccine 12/14/2019  8:56 AM 0.3 mL 11/09/2019 Intramuscular   Manufacturer: [place]   Lot: [id number]   [place]: [id number]

## 2020-01-01 ENCOUNTER — Ambulatory Visit: Payer: Medicare Other

## 2020-01-01 ENCOUNTER — Ambulatory Visit: Payer: Medicare Other | Attending: Internal Medicine

## 2020-01-01 DIAGNOSIS — Z23 Encounter for immunization: Secondary | ICD-10-CM | POA: Insufficient documentation

## 2020-01-01 NOTE — Progress Notes (Signed)
   [id number]  Name:  Shane Sartini Sr.    MRN: [id number] DOB: [birth year]  01/01/2020  Shane Knight was observed post Covid-19 immunization for 15 minutes without incidence. He was provided with Vaccine Information Sheet and instruction to access the V-Safe system.   Shane Knight was instructed to call 911 with any severe reactions post vaccine: Marland Kitchen Difficulty breathing  . Swelling of your face and throat  . A fast heartbeat  . A bad rash all over your body  . Dizziness and weakness    Immunizations Administered    Name Date Dose VIS Date Route   Pfizer COVID-19 Vaccine 01/01/2020 10:13 AM 0.3 mL 11/09/2019 Intramuscular   Manufacturer: [place]   Lot: [id number]   [place]: [id number]

## 2020-01-27 ENCOUNTER — Encounter (HOSPITAL_BASED_OUTPATIENT_CLINIC_OR_DEPARTMENT_OTHER): Payer: Self-pay

## 2020-01-27 ENCOUNTER — Emergency Department (HOSPITAL_BASED_OUTPATIENT_CLINIC_OR_DEPARTMENT_OTHER): Payer: Medicare Other

## 2020-01-27 ENCOUNTER — Other Ambulatory Visit: Payer: Self-pay

## 2020-01-27 ENCOUNTER — Emergency Department (HOSPITAL_BASED_OUTPATIENT_CLINIC_OR_DEPARTMENT_OTHER)
Admission: EM | Admit: 2020-01-27 | Discharge: 2020-01-27 | Disposition: A | Discharge status: Home / Self Care | Payer: Medicare Other | Attending: Emergency Medicine | Admitting: Emergency Medicine

## 2020-01-27 DIAGNOSIS — Z79899 Other long term (current) drug therapy: Secondary | ICD-10-CM | POA: Insufficient documentation

## 2020-01-27 DIAGNOSIS — I714 Abdominal aortic aneurysm, without rupture: Secondary | ICD-10-CM | POA: Insufficient documentation

## 2020-01-27 DIAGNOSIS — E785 Hyperlipidemia, unspecified: Secondary | ICD-10-CM | POA: Diagnosis not present

## 2020-01-27 DIAGNOSIS — Z87891 Personal history of nicotine dependence: Secondary | ICD-10-CM | POA: Insufficient documentation

## 2020-01-27 DIAGNOSIS — K5900 Constipation, unspecified: Secondary | ICD-10-CM | POA: Insufficient documentation

## 2020-01-27 DIAGNOSIS — I129 Hypertensive chronic kidney disease with stage 1 through stage 4 chronic kidney disease, or unspecified chronic kidney disease: Secondary | ICD-10-CM | POA: Insufficient documentation

## 2020-01-27 DIAGNOSIS — N183 Chronic kidney disease, stage 3 unspecified: Secondary | ICD-10-CM | POA: Diagnosis not present

## 2020-01-27 LAB — CBC WITH DIFFERENTIAL/PLATELET
Abs Immature Granulocytes: 0.02 10*3/uL (ref 0.00–0.07)
Basophils Absolute: 0.1 10*3/uL (ref 0.0–0.1)
Basophils Relative: 1 %
Eosinophils Absolute: 0.4 10*3/uL (ref 0.0–0.5)
Eosinophils Relative: 5 %
HCT: 47.7 % (ref 39.0–52.0)
Hemoglobin: 15.6 g/dL (ref 13.0–17.0)
Immature Granulocytes: 0 %
Lymphocytes Relative: 29 %
Lymphs Abs: 2.2 10*3/uL (ref 0.7–4.0)
MCH: 31.1 pg (ref 26.0–34.0)
MCHC: 32.7 g/dL (ref 30.0–36.0)
MCV: 95.2 fL (ref 80.0–100.0)
Monocytes Absolute: 0.7 10*3/uL (ref 0.1–1.0)
Monocytes Relative: 10 %
Neutro Abs: 4.2 10*3/uL (ref 1.7–7.7)
Neutrophils Relative %: 55 %
Platelets: 181 10*3/uL (ref 150–400)
RBC: 5.01 MIL/uL (ref 4.22–5.81)
RDW: 14 % (ref 11.5–15.5)
WBC: 7.6 10*3/uL (ref 4.0–10.5)
nRBC: 0 % (ref 0.0–0.2)

## 2020-01-27 LAB — COMPREHENSIVE METABOLIC PANEL
ALT: 20 U/L (ref 0–44)
AST: 23 U/L (ref 15–41)
Albumin: 4.2 g/dL (ref 3.5–5.0)
Alkaline Phosphatase: 85 U/L (ref 38–126)
Anion gap: 8 (ref 5–15)
BUN: 23 mg/dL (ref 8–23)
CO2: 31 mmol/L (ref 22–32)
Calcium: 10.1 mg/dL (ref 8.9–10.3)
Chloride: 99 mmol/L (ref 98–111)
Creatinine, Ser: 1.21 mg/dL (ref 0.61–1.24)
GFR calc Af Amer: 60 mL/min — ABNORMAL LOW (ref >60)
GFR calc non Af Amer: 52 mL/min — ABNORMAL LOW (ref >60)
Glucose, Bld: 97 mg/dL (ref 70–99)
Potassium: 4.2 mmol/L (ref 3.5–5.1)
Sodium: 138 mmol/L (ref 135–145)
Total Bilirubin: 0.8 mg/dL (ref 0.3–1.2)
Total Protein: 7 g/dL (ref 6.5–8.1)

## 2020-01-27 LAB — OCCULT BLOOD X 1 CARD TO LAB, STOOL: Fecal Occult Bld: NEGATIVE

## 2020-01-27 LAB — URINALYSIS, ROUTINE W REFLEX MICROSCOPIC
Bilirubin Urine: NEGATIVE
Glucose, UA: NEGATIVE mg/dL
Hgb urine dipstick: NEGATIVE
Ketones, ur: NEGATIVE mg/dL
Leukocytes,Ua: NEGATIVE
Nitrite: NEGATIVE
Protein, ur: NEGATIVE mg/dL
Specific Gravity, Urine: 1.01 (ref 1.005–1.030)
pH: 7 (ref 5.0–8.0)

## 2020-01-27 LAB — LIPASE, BLOOD: Lipase: 40 U/L (ref 11–51)

## 2020-01-27 MED ORDER — DOCUSATE SODIUM 100 MG PO CAPS: 100.0000 mg | ORAL_CAPSULE | Freq: Two times a day (BID) | ORAL | 0 refills | Status: DC

## 2020-01-27 MED ORDER — SODIUM CHLORIDE 0.9 % IV SOLN: Freq: Once | INTRAVENOUS | Status: AC

## 2020-01-27 MED ORDER — SIMETHICONE 80 MG PO CHEW: 80.0000 mg | CHEWABLE_TABLET | Freq: Four times a day (QID) | ORAL | 0 refills | Status: DC | PRN

## 2020-01-27 MED ORDER — IOHEXOL 300 MG/ML  SOLN
100.0000 mL | Freq: Once | INTRAMUSCULAR | Status: AC | PRN
  Administered 2020-01-27: 13:00:00 100 mL via INTRAVENOUS

## 2020-01-27 NOTE — ED Notes (Signed)
Pt aware of need for urine specimen, unable to provide at this time. 

## 2020-01-27 NOTE — ED Triage Notes (Signed)
Pt unable to move bowels adequately since Tuesday, talked to pmd Thursday, tried miralax without relief.  Pt reports abdominal distention, soft, nontender with palpation.

## 2020-01-27 NOTE — Discharge Instructions (Addendum)
1.  Your CT scan does not show any severe constipation or blockage.  There was no blockage in the rectum.  Start taking Colace twice daily to keep your stool soft and easy to pass.  Every second or third day, if you have not had a bowel movement, take a dose of MiraLAX daily until you have had a normal bowel movement. 2.  If you feel like you are having gas and bloating you may also take Gas-X as needed. 3.  Schedule a follow-up with your family doctor for recheck this week.

## 2020-01-27 NOTE — ED Provider Notes (Signed)
[facility] EMERGENCY DEPARTMENT Provider Note   CSN: [id number] Arrival date & time: 01/27/20  [id number]     History Chief Complaint  Patient presents with  . Constipation    Shane Austerman Sr. is a 86 y.o. male.  HPI Patient reports he has had decreased bowel movement.  He reports he had a large normal bowel movement last weekend which was approximately a week ago.  He reports since then he has only passed a small amount of stool.  He feels like his abdomen has gotten bloated.  He denies he has any pain.  No abdominal pain and no rectal pain.  He reports he has been trying to go to the bathroom but nothing is coming out.  No nausea or vomiting.  Patient reports he has been eating but about 2 days ago decreased his intake because he was worried that it was been to get all backed up.  He tried MiraLAX last night.  He has not tried other laxatives.  He reports he has been drinking some Gatorade.  He reports he occasionally gets constipation although is not a regular problem.  He denies any urinary pain burning or urgency.  He denies difficulty urinating.    Past Medical History:  Diagnosis Date  . AAA (abdominal aortic aneurysm) ([place])   . Chronic kidney disease   . Hyperlipemia   . Hypertension   . PVC (premature ventricular contraction)     Patient Active Problem List   Diagnosis Date Noted  . PVC (premature ventricular contraction)   . Frequent PVCs 01/29/2019  . CKD (chronic kidney disease) stage 3, GFR 30-59 ml/min 01/29/2019  . Essential hypertension 01/29/2019  . Abdominal aneurysm ([place]) 01/29/2019    Past Surgical History:  Procedure Laterality Date  . APPENDECTOMY    . EYE SURGERY     lens implants  . HERNIA REPAIR    . TENNIS ELBOW RELEASE/NIRSCHEL PROCEDURE    . TONSILLECTOMY    . TOTAL SHOULDER REPLACEMENT Bilateral        History reviewed. No pertinent family history.  Social History   Tobacco Use  . Smoking status: Former Research scientist (life sciences)  .  Smokeless tobacco: Never Used  Substance Use Topics  . Alcohol use: Not on file  . Drug use: Not on file    Home Medications Prior to Admission medications   Medication Sig Start Date End Date Taking? Authorizing Provider  acetaminophen (TYLENOL) 325 MG tablet Take 650 mg by mouth every 6 (six) hours as needed.    [provider]  amLODipine (NORVASC) 2.5 MG tablet Take 1 tablet (2.5 mg total) by mouth daily. 08/21/19 11/19/19  Miquel Dunn, NP  docusate sodium (COLACE) 100 MG capsule Take 1 capsule (100 mg total) by mouth every 12 (twelve) hours. 01/27/20   Charlesetta Shanks, MD  latanoprost (XALATAN) 0.005 % ophthalmic solution Place 1 drop into both eyes daily. 01/24/19   [provider]  lidocaine (LIDODERM) 5 % Place 1 patch onto the skin daily. Remove & Discard patch within 12 hours or as directed by MD 07/22/19   Randal Buba, April, MD  lisinopril-hydrochlorothiazide (ZESTORETIC) 10-12.5 MG tablet TAKE 1 TABLET BY MOUTH EVERY DAY 11/13/19   Miquel Dunn, NP  metroNIDAZOLE (FLAGYL) 500 MG tablet Take 1 tablet (500 mg total) by mouth 2 (two) times daily. One po bid x 7 days 07/22/19   Palumbo, April, MD  Multiple Vitamins-Minerals (MULTIVITAMIN WITH MINERALS) tablet Take 1 tablet by mouth daily.  [provider]  Multiple Vitamins-Minerals (PRESERVISION AREDS 2+MULTI VIT PO) Take 2 capsules by mouth daily.    [provider]  pregabalin (LYRICA) 100 MG capsule Take 100 mg by mouth 2 (two) times daily after a meal. 09/05/15   [provider]  simethicone (GAS-X) 80 MG chewable tablet Chew 1 tablet (80 mg total) by mouth every 6 (six) hours as needed for flatulence. 01/27/20   Charlesetta Shanks, MD    Allergies    Tape  Review of Systems   Review of Systems 10 Systems reviewed and are negative for acute change except as noted in the HPI.  Physical Exam Updated Vital Signs BP (!) 148/76 (BP Location: Right Arm)   Pulse 78   Temp  98.2 F (36.8 C) (Oral)   Resp 18   Ht 5\' 8"  (1.727 m)   Wt 82.2 kg   SpO2 96%   BMI 27.55 kg/m   Physical Exam Constitutional:      Comments: Alert nontoxic and clinically well in appearance.  HENT:     Head: Normocephalic and atraumatic.  Eyes:     Extraocular Movements: Extraocular movements intact.  Cardiovascular:     Comments: Heart regular.  Occasional ectopy.  No gross rub murmur Pulmonary:     Effort: Pulmonary effort is normal.     Breath sounds: Normal breath sounds.  Abdominal:     General: There is no distension.     Palpations: Abdomen is soft.     Tenderness: There is no abdominal tenderness. There is no guarding.     Comments: Bowel sounds hyperactive.  Abdomen is very soft.  No pain to palpation.  Easily reducible ventral wall hernia.  Nontender.  Lower abdomen nontender.  Rectal: No stool in the vault.  Trace brown.  No melena.  No frank blood.  No hemorrhoids.  Musculoskeletal:        General: Normal range of motion.     Comments: 1+ pitting edema bilaterally.  Patient is wearing compression hose.  Calves nontender.  Neurological:     General: No focal deficit present.     Mental Status: He is oriented to person, place, and time.     Coordination: Coordination normal.  Psychiatric:        Mood and Affect: Mood normal.     ED Results / Procedures / Treatments   Labs (all labs ordered are listed, but only abnormal results are displayed) Labs Reviewed  COMPREHENSIVE METABOLIC PANEL - Abnormal; Notable for the following components:      Result Value   GFR calc non Af Amer 52 (*)    GFR calc Af Amer 60 (*)    All other components within normal limits  LIPASE, BLOOD  CBC WITH DIFFERENTIAL/PLATELET  URINALYSIS, ROUTINE W REFLEX MICROSCOPIC  OCCULT BLOOD X 1 CARD TO LAB, STOOL    EKG None  Radiology CT Abdomen Pelvis W Contrast  Result Date: 01/27/2020 CLINICAL DATA:  Abdominal distension and constipation. Prior hernia repair and appendectomy.  EXAM: CT ABDOMEN AND PELVIS WITH CONTRAST TECHNIQUE: Multidetector CT imaging of the abdomen and pelvis was performed using the standard protocol following bolus administration of intravenous contrast. CONTRAST:  136mL OMNIPAQUE IOHEXOL 300 MG/ML  SOLN COMPARISON:  Plain film earlier today.  Abdominal CT 08/16/2019. FINDINGS: Lower chest: Marked right hemidiaphragm elevation with right lower lobe volume loss. Normal heart size without pericardial or pleural effusion. Lad coronary artery atherosclerosis. Hepatobiliary: Segment 4 hepatic cyst of 2.0 cm. Normal gallbladder, without  biliary ductal dilatation. Pancreas: Normal, without mass or ductal dilatation. Spleen: 1.4 cm simple cyst in the spleen. Adrenals/Urinary Tract: Normal adrenal glands. Interpolar left renal 1.2 cm lesion is relatively similar in size to on the prior and remains indeterminate. There is also an upper pole left renal 1.7 cm cyst. No hydronephrosis. Normal right kidney. Normal urinary bladder. Stomach/Bowel: Normal stomach, without wall thickening. Scattered colonic diverticula. Minimal extension of diverticula into the left abdominal wall musculature, including on 67/2. No obstruction or surrounding edema to suggest clinical significance. Normal large and small bowel caliber. No pneumatosis or free intraperitoneal air. Vascular/Lymphatic: Aortic and branch vessel atherosclerosis. No abdominopelvic adenopathy. Reproductive: Mild prostatomegaly. Other: No significant free fluid. Musculoskeletal: Lumbosacral spondylosis with trace L4-5 anterolisthesis. IMPRESSION: 1.  No acute process in the abdomen or pelvis. 2. Marked right hemidiaphragm elevation. 3. Coronary artery atherosclerosis. Aortic Atherosclerosis (ICD10-I70.0). 4. Indeterminate left renal mass, as before. Electronically Signed   By: Abigail Miyamoto M.D.   On: 01/27/2020 13:20   DG Abd Acute W/Chest  Result Date: 01/27/2020 CLINICAL DATA:  Constipation. EXAM: DG ABDOMEN ACUTE W/ 1V  CHEST COMPARISON:  CT scan abdomen/pelvis 08/16/2019 FINDINGS: Stable marked asymmetric elevation right hemidiaphragm. The lungs are clear without focal pneumonia, edema, pneumothorax or pleural effusion. The cardiopericardial silhouette is within normal limits for size. There is pulmonary vascular congestion without overt pulmonary edema. Status post bilateral shoulder replacement. Lucency under the left hemidiaphragm raises concern for intraperitoneal free air. Supine abdomen shows diffuse gaseous distension of small bowel and colon without a frankly obstructive gas pattern. Diffuse degenerative changes are seen in the lumbar spine. IMPRESSION: 1. Lucency under the left hemidiaphragm on upright imaging raises concern for intraperitoneal free air. CT scan of the abdomen and pelvis recommended to further evaluate. 2. Diffuse gaseous distention of large and small bowel without a frankly obstructive pattern. Features may reflect ileus. This could also be further assessed by CT. Electronically Signed   By: Misty Stanley M.D.   On: 01/27/2020 11:07    Procedures Procedures (including critical care time)  Medications Ordered in ED Medications  0.9 %  sodium chloride infusion ( Intravenous New Bag/Given 01/27/20 1221)  iohexol (OMNIPAQUE) 300 MG/ML solution 100 mL (100 mLs Intravenous Contrast Given 01/27/20 1259)    ED Course  I have reviewed the triage vital signs and the nursing notes.  Pertinent labs & imaging results that were available during my care of the patient were reviewed by me and considered in my medical decision making (see chart for details).    MDM Rules/Calculators/A&P                      Patient reports he had decreased bowel movement over the past week.  He had a large bowel movement a week ago.  He however does not have abdominal pain or rectal pain.  There is no fecal impaction.  CT scan done as follow-up to acute abdominal series does not show acute findings.  At this time  recommend patient take twice daily Colace and simethicone as needed.  Is counseled on taking MiraLAX every 2 to 3 days if not having a regular bowel movement.  He is clinically well in appearance.  At this time stable for discharge.  Plan will be for patient to follow-up with his PCP this week for recheck.  Return precautions reviewed. Final Clinical Impression(s) / ED Diagnoses Final diagnoses:  Constipation, unspecified constipation type    Rx / DC Orders  ED Discharge Orders         Ordered    docusate sodium (COLACE) 100 MG capsule  Every 12 hours     01/27/20 1417    simethicone (GAS-X) 80 MG chewable tablet  Every 6 hours PRN     01/27/20 1417           Charlesetta Shanks, MD 01/27/20 1420

## 2020-02-05 ENCOUNTER — Other Ambulatory Visit: Payer: Self-pay

## 2020-02-05 ENCOUNTER — Encounter: Payer: Self-pay | Admitting: Cardiology

## 2020-02-05 ENCOUNTER — Ambulatory Visit: Payer: Medicare Other | Admitting: Cardiology

## 2020-02-05 VITALS — BP 146/78 | HR 85 | Temp 98.0°F | Ht 68.0 in | Wt 180.0 lb

## 2020-02-05 DIAGNOSIS — Z87891 Personal history of nicotine dependence: Secondary | ICD-10-CM

## 2020-02-05 DIAGNOSIS — I493 Ventricular premature depolarization: Secondary | ICD-10-CM

## 2020-02-05 DIAGNOSIS — I129 Hypertensive chronic kidney disease with stage 1 through stage 4 chronic kidney disease, or unspecified chronic kidney disease: Secondary | ICD-10-CM

## 2020-02-05 DIAGNOSIS — I714 Abdominal aortic aneurysm, without rupture, unspecified: Secondary | ICD-10-CM

## 2020-02-05 MED ORDER — LISINOPRIL-HYDROCHLOROTHIAZIDE 10-12.5 MG PO TABS: 1.0000 | ORAL_TABLET | Freq: Every day | ORAL | 1 refills | Status: DC

## 2020-02-05 NOTE — Progress Notes (Signed)
Chief Complaint  Patient presents with  . Hypertension    follow up   . PVC    REQUESTING PHYSICIAN:  Lyman Bishop, DO [place],   [id number]  HPI  Shane Knight. is a 86 y.o. male who presents to the office with a chief complaint of "hypertension." Patient's past medical history and cardiac risk factors include: 3cm abdominal aortic aneurysm detected on duplex in June 2018, hypertension with chronic kidney disease stage III, chronic venous insufficiency, PVCs, advanced age.  Hypertension: This is a chronic condition for the patient medications reviewed at today's office visit.  Patient states that his home blood pressures are usually around [id number] mmHg systolics and never above 140 mmHg.  Patient states that he is compliant with his medications, tries to follow low-salt diet, and no new symptoms of chest pain or shortness of breath with effort related activities.  Patient is requesting a refill on his lisinopril/hydrochlorothiazide.  History of abdominal aortic aneurysm: Reviewed the reports of his prior ultrasound of the aorta findings noted below.  Patient denies any feeling of pulsation in the abdomen.  No pain in the back and/or the groins bilaterally.  Patient's blood pressure at home is around 135 mmHg.  They tried to intensify his antihypertensive medications but he became symptomatic in regards to lightheaded and dizziness and therefore further medication titration was not pursued.  We talked about this in great detail at today's office visit and patient does not want to uptitrate antihypertensive medication at this time.  He no longer is smoking.  Given the fact that the patient is very active for his age would recommend reevaluation.  Review of systems positive for: Lower extremity swelling chronic and stable. Currently patient denies chest pain, shortness of breath at rest or effort related symptoms, lightheadedness, dizziness, palpitations,  orthopnea, paroxysmal nocturnal dyspnea,near syncope, syncopal events, hematochezia, hemoptysis, hematemesis, melanotic stools, no symptoms of amaurosis fugax, motor or sensory symptoms or dysphasia in the last 6 months.   ALLERGIES: Allergies  Allergen Reactions  . Tape Rash     MEDICATION LIST PRIOR TO VISIT: Current Outpatient Medications on File Prior to Visit  Medication Sig Dispense Refill  . acetaminophen (TYLENOL) 325 MG tablet Take 650 mg by mouth every 6 (six) hours as needed.    Marland Kitchen amLODipine (NORVASC) 2.5 MG tablet Take 1 tablet (2.5 mg total) by mouth daily. 90 tablet 1  . docusate sodium (COLACE) 100 MG capsule Take 1 capsule (100 mg total) by mouth every 12 (twelve) hours. 60 capsule 0  . latanoprost (XALATAN) 0.005 % ophthalmic solution Place 1 drop into both eyes daily.    Marland Kitchen lidocaine (LIDODERM) 5 % Place 1 patch onto the skin daily. Remove & Discard patch within 12 hours or as directed by MD 30 patch 0  . Multiple Vitamins-Minerals (MULTIVITAMIN WITH MINERALS) tablet Take 1 tablet by mouth daily.    . Multiple Vitamins-Minerals (PRESERVISION AREDS 2+MULTI VIT PO) Take 2 capsules by mouth daily.    . pregabalin (LYRICA) 100 MG capsule Take 100 mg by mouth 2 (two) times daily after a meal.    . simethicone (GAS-X) 80 MG chewable tablet Chew 1 tablet (80 mg total) by mouth every 6 (six) hours as needed for flatulence. 30 tablet 0   No current facility-administered medications on file prior to visit.    PAST MEDICAL HISTORY: Past Medical History:  Diagnosis Date  . AAA (abdominal aortic aneurysm) (Learned)   .  Chronic kidney disease   . Hyperlipemia   . Hypertension   . PVC (premature ventricular contraction)     PAST SURGICAL HISTORY: Past Surgical History:  Procedure Laterality Date  . APPENDECTOMY    . EYE SURGERY     lens implants  . HERNIA REPAIR    . TENNIS ELBOW RELEASE/NIRSCHEL PROCEDURE    . TONSILLECTOMY    . TOTAL SHOULDER REPLACEMENT Bilateral      FAMILY HISTORY: The patient family history is not on file.   SOCIAL HISTORY:  The patient  reports that he has quit smoking. He has never used smokeless tobacco.  14 ORGAN REVIEW OF SYSTEMS: CONSTITUTIONAL: No fever or significant weight loss EYES: No recent significant visual change EARS, NOSE, MOUTH, THROAT: No recent significant change in hearing CARDIOVASCULAR: See discussion in subjective/HPI RESPIRATORY: See discussion in subjective/HPI GASTROINTESTINAL: No recent complaints of abdominal pain GENITOURINARY: No recent significant change in genitourinary status MUSCULOSKELETAL: No recent significant change in musculoskeletal status INTEGUMENTARY: No recent rash NEUROLOGIC: No recent significant change in motor function PSYCHIATRIC: No recent significant change in mood ENDOCRINOLOGIC: No recent significant change in endocrine status HEMATOLOGIC/LYMPHATIC: No recent significant unexpected bruising ALLERGIC/IMMUNOLOGIC: No recent unexplained allergic reaction  PHYSICAL EXAM: Vitals with BMI 02/05/2020 02/05/2020 01/27/2020  Height - 5\' 8"  -  Weight - 180 lbs -  BMI - [id number] -  Systolic [id number]  Diastolic 78 94 76  Pulse 85 88 78    CONSTITUTIONAL: Well-developed and well-nourished. No acute distress.  SKIN: Skin is warm and dry. No rash noted. No cyanosis. No pallor. No jaundice HEAD: Normocephalic and atraumatic.  EYES: No scleral icterus MOUTH/THROAT: Moist oral membranes.  NECK: No JVD present. No thyromegaly noted. No carotid bruits  LYMPHATIC: No visible cervical adenopathy.  CHEST Normal respiratory effort. No intercostal retractions  LUNGS: Clear to auscultation bilaterally.  No stridor. No wheezes. No rales.  CARDIOVASCULAR: Regular rate and rhythm, positive [id number], holosystolic murmur heard at left sternal border, no gallops or rubs appreciated ABDOMINAL: No apparent ascites. No bruit  EXTREMITIES: bilateral trace peripheral edema  HEMATOLOGIC: No significant  bruising NEUROLOGIC: Oriented to person, place, and time. Nonfocal. Normal muscle tone.  PSYCHIATRIC: Normal mood and affect. Normal behavior. Cooperative  CARDIAC DATABASE: EKG: 01/29/2019: Sinus rhythm at 78 bpm with first degree AV block with frequent PAC's and 1 PVC. Left atrial enlargement, left axis deviation, left anterior fasicular block, IRBBB. Nonspecific T abnormality.  Echocardiogram 06/27/2018: Left ventricle cavity is normal in size. Mild concentric hypertrophy of the left ventricle. Wall motion probably normal. Accurate assessment of LVEF and diastolic function limited due to multiple PVC's, ?Afib. Calculated EF 54%. Left atrial cavity is mildly dilated. Right atrial cavity is mildly dilated. Right ventricle cavity is mildly dilated. Normal right ventricular function. Moderate tricuspid regurgitation. Estimated pulmonary artery systolic pressure 43 mmHg. Mild to moderate pulmonic regurgitation. Compared to previous study on 07/27/2018, mild RV dilatation is new. PASP mildly increased.  Carotid artery duplex 07/27/2017: No hemodynamically significant arterial disease in the internal carotid artery bilaterally. Minimal soft plaque noted. Antegrade right vertebral artery flow. Antegrade left vertebral artery flow.  Ultrasound abdominal aorta:  05/23/2017: Proximal abdominal aortic aneurysm, measuring 3.1 cm AP x 3.1 cm TRV. Dilated distal left common iliac artery, measuring 1.6 cm AP x 1.5 cm TRV. No hemodynamically significant arterial stenosis in the aorta, common iliac and external iliac arteries. Patent IVC. f/u 1 year  12/18/2018: The maximum aorta diameter is 2.1 cm (mid). No evidence  of atherosclerotic plaque. Normal flow velocities noted. No AAA noted.  LABORATORY DATA: CBC Latest Ref Rng & Units 01/27/2020 07/22/2019 03/30/2010  WBC 4.0 - 10.5 K/uL 7.6 10.0 8.1  Hemoglobin 13.0 - 17.0 g/dL 15.6 14.9 16.1  Hematocrit 39.0 - 52.0 % 47.7 45.6 46.5  Platelets 150 - 400  K/uL 181 166 196    CMP Latest Ref Rng & Units 01/27/2020 07/22/2019 03/30/2010  Glucose 70 - 99 mg/dL 97 112(H) 117(H)  BUN 8 - 23 mg/dL 23 19 26(H)  Creatinine 0.61 - 1.24 mg/dL 1.21 1.17 1.38  Sodium 135 - 145 mmol/L 138 138 139  Potassium 3.5 - 5.1 mmol/L 4.2 3.7 4.1  Chloride 98 - 111 mmol/L 99 101 102  CO2 22 - 32 mmol/L 31 25 28   Calcium 8.9 - 10.3 mg/dL 10.1 9.6 9.6  Total Protein 6.5 - 8.1 g/dL 7.0 - 6.7  Total Bilirubin 0.3 - 1.2 mg/dL 0.8 - 0.9  Alkaline Phos 38 - 126 U/L 85 - 67  AST 15 - 41 U/L 23 - 20  ALT 0 - 44 U/L 20 - 20    Lipid Panel  12/13/2018: Total cholesterol 173, HDL 53, LDL 93, triglycerides 137.  FINAL MEDICATION LIST END OF ENCOUNTER: Meds ordered this encounter  Medications  . lisinopril-hydrochlorothiazide (ZESTORETIC) 10-12.5 MG tablet    Sig: Take 1 tablet by mouth daily.    Dispense:  30 tablet    Refill:  1    Medications Discontinued During This Encounter  Medication Reason  . metroNIDAZOLE (FLAGYL) 500 MG tablet Error  . lisinopril-hydrochlorothiazide (ZESTORETIC) 10-12.5 MG tablet Reorder     Current Outpatient Medications:  .  acetaminophen (TYLENOL) 325 MG tablet, Take 650 mg by mouth every 6 (six) hours as needed., Disp: , Rfl:  .  amLODipine (NORVASC) 2.5 MG tablet, Take 1 tablet (2.5 mg total) by mouth daily., Disp: 90 tablet, Rfl: 1 .  docusate sodium (COLACE) 100 MG capsule, Take 1 capsule (100 mg total) by mouth every 12 (twelve) hours., Disp: 60 capsule, Rfl: 0 .  latanoprost (XALATAN) 0.005 % ophthalmic solution, Place 1 drop into both eyes daily., Disp: , Rfl:  .  lidocaine (LIDODERM) 5 %, Place 1 patch onto the skin daily. Remove & Discard patch within 12 hours or as directed by MD, Disp: 30 patch, Rfl: 0 .  lisinopril-hydrochlorothiazide (ZESTORETIC) 10-12.5 MG tablet, Take 1 tablet by mouth daily., Disp: 30 tablet, Rfl: 1 .  Multiple Vitamins-Minerals (MULTIVITAMIN WITH MINERALS) tablet, Take 1 tablet by mouth daily., Disp: ,  Rfl:  .  Multiple Vitamins-Minerals (PRESERVISION AREDS 2+MULTI VIT PO), Take 2 capsules by mouth daily., Disp: , Rfl:  .  pregabalin (LYRICA) 100 MG capsule, Take 100 mg by mouth 2 (two) times daily after a meal., Disp: , Rfl:  .  simethicone (GAS-X) 80 MG chewable tablet, Chew 1 tablet (80 mg total) by mouth every 6 (six) hours as needed for flatulence., Disp: 30 tablet, Rfl: 0  IMPRESSION:    ICD-10-CM   1. Benign hypertension with CKD (chronic kidney disease) stage III  I12.9 lisinopril-hydrochlorothiazide (ZESTORETIC) 10-12.5 MG tablet   N18.30   2. Former smoker  Z87.891 PCV AORTA DUPLEX    Lipid Panel With LDL/HDL Ratio  3. Abdominal aneurysm (HCC)  I71.4 PCV AORTA DUPLEX    Lipid Panel With LDL/HDL Ratio  4. PVC (premature ventricular contraction)  I49.3 EKG 12-Lead     RECOMMENDATIONS: Shane Kostoff Sr. is a 86 y.o. male whose past  medical history and cardiac risk factors include: 3cm abdominal aortic aneurysm detected on duplex in June 2018, hypertension with chronic kidney disease stage III, chronic venous insufficiency, PVCs, advanced age.  Benign essential hypertension with chronic kidney disease stage III:  Lisinopril/hydrochlorothiazide refilled.  Patient does not want to uptitrate antihypertensive medications in the setting of abdominal aortic aneurysm because in the past he has had symptoms of hypotension including lightheaded and dizziness.  However I have educated the patient that if his systolic blood pressure is consistently greater than 140 mmHg he is asked to call the office so the medications can be titrated given the clinical setting.  Patient verbalized understanding.  Abdominal aortic aneurysm:  We will check an aortic duplex to reevaluate the abdominal aorta dimensions.  We will check fasting lipid profile  Former smoker: Patient educated on the importance of continued smoking cessation.  Orders Placed This Encounter  Procedures  . Lipid Panel  With LDL/HDL Ratio  . EKG 12-Lead  . PCV AORTA DUPLEX   --Continue cardiac medications as reconciled in final medication list. --Return in about 6 months (around 08/07/2020) for Discussion of ultrasound results and HTN. Or sooner if needed. --Continue follow-up with your primary care physician regarding the management of your other chronic comorbid conditions.  Patient's questions and concerns were addressed to his satisfaction. He voices understanding of the instructions provided during this encounter.   This note was created using a voice recognition software as a result there may be grammatical errors inadvertently enclosed that do not reflect the nature of this encounter. Every attempt is made to correct such errors.  Rex Kras, DO, [facility]. [place] Office: [phone]

## 2020-02-18 ENCOUNTER — Other Ambulatory Visit: Payer: Self-pay | Admitting: Cardiology

## 2020-02-19 LAB — LIPID PANEL WITH LDL/HDL RATIO
Cholesterol, Total: 152 mg/dL (ref 100–199)
HDL: 53 mg/dL (ref >39)
LDL Chol Calc (NIH): 82 mg/dL (ref 0–99)
LDL/HDL Ratio: 1.5 ratio (ref 0.0–3.6)
Triglycerides: 90 mg/dL (ref 0–149)
VLDL Cholesterol Cal: 17 mg/dL (ref 5–40)

## 2020-02-21 ENCOUNTER — Other Ambulatory Visit: Payer: Self-pay

## 2020-02-21 ENCOUNTER — Ambulatory Visit: Payer: Medicare Other

## 2020-02-21 DIAGNOSIS — Z87891 Personal history of nicotine dependence: Secondary | ICD-10-CM

## 2020-02-21 DIAGNOSIS — I714 Abdominal aortic aneurysm, without rupture, unspecified: Secondary | ICD-10-CM

## 2020-02-22 ENCOUNTER — Other Ambulatory Visit: Payer: Self-pay

## 2020-02-22 DIAGNOSIS — I714 Abdominal aortic aneurysm, without rupture, unspecified: Secondary | ICD-10-CM

## 2020-02-22 DIAGNOSIS — Z87891 Personal history of nicotine dependence: Secondary | ICD-10-CM

## 2020-03-03 ENCOUNTER — Telehealth: Payer: Self-pay

## 2020-03-03 NOTE — Telephone Encounter (Signed)
-----   Message from [place], [place] sent at 03/01/2020  2:37 PM EDT ----- No AAA noted, per report

## 2020-03-03 NOTE — Telephone Encounter (Signed)
Left voicemail for patient to cb.

## 2020-03-06 ENCOUNTER — Other Ambulatory Visit (HOSPITAL_BASED_OUTPATIENT_CLINIC_OR_DEPARTMENT_OTHER): Payer: Self-pay | Admitting: Family Medicine

## 2020-03-06 DIAGNOSIS — N289 Disorder of kidney and ureter, unspecified: Secondary | ICD-10-CM

## 2020-03-09 ENCOUNTER — Other Ambulatory Visit: Payer: Self-pay

## 2020-03-09 ENCOUNTER — Ambulatory Visit (HOSPITAL_BASED_OUTPATIENT_CLINIC_OR_DEPARTMENT_OTHER)
Admission: RE | Admit: 2020-03-09 | Discharge: 2020-03-09 | Disposition: A | Discharge status: Home / Self Care | Payer: Medicare Other | Source: Ambulatory Visit | Attending: Family Medicine | Admitting: Family Medicine

## 2020-03-09 DIAGNOSIS — N289 Disorder of kidney and ureter, unspecified: Secondary | ICD-10-CM | POA: Diagnosis present

## 2020-03-09 MED ORDER — GADOBUTROL 1 MMOL/ML IV SOLN
8.0000 mL | Freq: Once | INTRAVENOUS | Status: AC | PRN
  Administered 2020-03-09: 8 mL via INTRAVENOUS

## 2020-03-25 ENCOUNTER — Telehealth: Payer: Self-pay

## 2020-03-25 NOTE — Telephone Encounter (Signed)
Patient's daughter called in regards to patient's Amlodipine. Daughter stated that patient has not taken his Amlodipine in at least a week due to forgetting to and not picking up the refill. Daughter is unsure as to whether the patient has went five days without the Amlodipine or a month. Daughter stated she would pick the RX up today but would like to know if it's safe for patient to continue dosage as prescribed. Please advise. Thanks!

## 2020-03-25 NOTE — Telephone Encounter (Signed)
Relayed information to patient's daughter, daughter voiced understanding and will pick up Amlodipine.

## 2020-03-25 NOTE — Telephone Encounter (Signed)
Vincenza Hews it is

## 2020-04-04 ENCOUNTER — Other Ambulatory Visit: Payer: Self-pay

## 2020-04-04 DIAGNOSIS — N183 Chronic kidney disease, stage 3 unspecified: Secondary | ICD-10-CM

## 2020-04-04 DIAGNOSIS — I129 Hypertensive chronic kidney disease with stage 1 through stage 4 chronic kidney disease, or unspecified chronic kidney disease: Secondary | ICD-10-CM

## 2020-04-04 MED ORDER — LISINOPRIL-HYDROCHLOROTHIAZIDE 10-12.5 MG PO TABS: 1.0000 | ORAL_TABLET | Freq: Every day | ORAL | 1 refills | Status: DC

## 2020-04-12 ENCOUNTER — Other Ambulatory Visit (HOSPITAL_BASED_OUTPATIENT_CLINIC_OR_DEPARTMENT_OTHER): Payer: Self-pay | Admitting: Family Medicine

## 2020-04-12 DIAGNOSIS — R1310 Dysphagia, unspecified: Secondary | ICD-10-CM

## 2020-04-14 ENCOUNTER — Other Ambulatory Visit (HOSPITAL_BASED_OUTPATIENT_CLINIC_OR_DEPARTMENT_OTHER): Payer: Self-pay | Admitting: Family Medicine

## 2020-04-14 DIAGNOSIS — C439 Malignant melanoma of skin, unspecified: Secondary | ICD-10-CM

## 2020-04-14 DIAGNOSIS — R1310 Dysphagia, unspecified: Secondary | ICD-10-CM

## 2020-04-16 ENCOUNTER — Ambulatory Visit (HOSPITAL_BASED_OUTPATIENT_CLINIC_OR_DEPARTMENT_OTHER)
Admission: RE | Admit: 2020-04-16 | Discharge: 2020-04-16 | Disposition: A | Discharge status: Home / Self Care | Payer: Medicare Other | Source: Ambulatory Visit | Attending: Family Medicine | Admitting: Family Medicine

## 2020-04-16 ENCOUNTER — Encounter (HOSPITAL_BASED_OUTPATIENT_CLINIC_OR_DEPARTMENT_OTHER): Payer: Self-pay

## 2020-04-16 ENCOUNTER — Other Ambulatory Visit: Payer: Self-pay

## 2020-04-16 ENCOUNTER — Telehealth: Payer: Self-pay

## 2020-04-16 DIAGNOSIS — R1310 Dysphagia, unspecified: Secondary | ICD-10-CM | POA: Insufficient documentation

## 2020-04-16 DIAGNOSIS — C439 Malignant melanoma of skin, unspecified: Secondary | ICD-10-CM

## 2020-04-16 DIAGNOSIS — C799 Secondary malignant neoplasm of unspecified site: Secondary | ICD-10-CM | POA: Diagnosis present

## 2020-04-16 MED ORDER — IOHEXOL 300 MG/ML  SOLN
100.0000 mL | Freq: Once | INTRAMUSCULAR | Status: AC | PRN
  Administered 2020-04-16: 75 mL via INTRAVENOUS

## 2020-04-16 NOTE — Telephone Encounter (Signed)
Patient left a voicemail stating the lisinopril makes his chest hurt.

## 2020-04-16 NOTE — Telephone Encounter (Signed)
Patient is taking  lisinopril-hydrochlorothiazide as per last office note.   Please reconfirm if its a combination pill or only lisinopril.   Also, he has been on it before he started seeing me. Did he start anything that could be cause of his symptom?

## 2020-04-17 NOTE — Telephone Encounter (Signed)
Patients daughter called and said that he is refusing to take the lisinopril/hctz. He is currently on amlodipine 2.5.  She will watch his BP and check for swelling daily.

## 2020-04-17 NOTE — Telephone Encounter (Signed)
Okay 

## 2020-05-08 ENCOUNTER — Other Ambulatory Visit: Payer: Self-pay | Admitting: Cardiology

## 2020-05-08 DIAGNOSIS — N183 Chronic kidney disease, stage 3 unspecified: Secondary | ICD-10-CM

## 2020-06-24 ENCOUNTER — Other Ambulatory Visit: Payer: Self-pay | Admitting: Cardiology

## 2020-09-22 ENCOUNTER — Other Ambulatory Visit: Payer: Self-pay

## 2020-09-22 MED ORDER — AMLODIPINE BESYLATE 2.5 MG PO TABS: 2.5000 mg | ORAL_TABLET | Freq: Every day | ORAL | 6 refills | Status: DC

## 2020-09-25 ENCOUNTER — Ambulatory Visit: Payer: Medicare Other | Admitting: Cardiology

## 2020-09-25 ENCOUNTER — Other Ambulatory Visit: Payer: Self-pay

## 2020-09-25 ENCOUNTER — Encounter: Payer: Self-pay | Admitting: Cardiology

## 2020-09-25 VITALS — BP 145/81 | HR 77 | Ht 68.0 in | Wt 179.0 lb

## 2020-09-25 DIAGNOSIS — I129 Hypertensive chronic kidney disease with stage 1 through stage 4 chronic kidney disease, or unspecified chronic kidney disease: Secondary | ICD-10-CM

## 2020-09-25 DIAGNOSIS — Z87891 Personal history of nicotine dependence: Secondary | ICD-10-CM

## 2020-09-25 DIAGNOSIS — N183 Chronic kidney disease, stage 3 unspecified: Secondary | ICD-10-CM

## 2020-09-25 DIAGNOSIS — I493 Ventricular premature depolarization: Secondary | ICD-10-CM

## 2020-09-25 MED ORDER — AMLODIPINE BESYLATE 5 MG PO TABS: 5.0000 mg | ORAL_TABLET | Freq: Every morning | ORAL | 0 refills | Status: DC

## 2020-09-25 NOTE — Progress Notes (Signed)
Chief Complaint  Patient presents with  . Hypertension  . Follow-up   Date: 09/25/20 Last Office Visit: 02/05/2020  HPI  Farhad Burleson Sr. is a 86 y.o. male who presents to the office with a chief complaint of " hypertension follow-up." Patient's past medical history and cardiac risk factors include: hypertension with chronic kidney disease stage III, chronic venous insufficiency, PVCs, advanced age.  Patient is accompanied by his daughter Judson Roch at today's office visit.  Benign essential hypertension: Since last office visit patient states that he has stopped his lisinopril at the end of May 2021.  Patient states the reason he stopped it is because " it was bothering him."  That he is not able to additional details regarding this.  He is currently on amlodipine 2.5 mg p.o. daily.  Patient states that his home blood pressures range anywhere from 125-145 mmHg.  He is only noticed the blood pressure 145 mmHg around the time when he forgot to take his Norvasc.  Patient is otherwise asymptomatic and does not have any focal neurological deficits or headaches.  Patient's daughter states that he recently went to his PCP in September 2021 was reencouraged to restart lisinopril as it is renal protective.  However, patient is reluctant.  I reinforced that lisinopril and other ACE inhibitors are renal protective; however, if he does not feel comfortable being on ACE inhibitor's we can consider an alternative such as ARB's.  ALLERGIES: Allergies  Allergen Reactions  . Tape Rash   MEDICATION LIST PRIOR TO VISIT: Current Outpatient Medications on File Prior to Visit  Medication Sig Dispense Refill  . acetaminophen (TYLENOL) 325 MG tablet Take 650 mg by mouth every 6 (six) hours as needed.    . calcium carbonate (TUMS CHEWY BITES) 750 MG chewable tablet Chew 1 tablet by mouth as needed for heartburn.    . docusate sodium (COLACE) 100 MG capsule Take 1 capsule (100 mg total) by mouth every 12  (twelve) hours. 60 capsule 0  . latanoprost (XALATAN) 0.005 % ophthalmic solution INSTILL 1 DROP INTO BOTH EYES EVERY DAY AT NIGHT    . Multiple Vitamins-Minerals (MULTIVITAMIN WITH MINERALS) tablet Take 1 tablet by mouth daily.    . Multiple Vitamins-Minerals (PRESERVISION AREDS 2+MULTI VIT PO) Take 2 capsules by mouth daily.    . pregabalin (LYRICA) 150 MG capsule Take 150 mg by mouth 2 (two) times daily.     No current facility-administered medications on file prior to visit.    PAST MEDICAL HISTORY: Past Medical History:  Diagnosis Date  . AAA (abdominal aortic aneurysm) ([place])   . Chronic kidney disease   . Hyperlipemia   . Hypertension   . PVC (premature ventricular contraction)     PAST SURGICAL HISTORY: Past Surgical History:  Procedure Laterality Date  . APPENDECTOMY    . EYE SURGERY     lens implants  . HERNIA REPAIR    . TENNIS ELBOW RELEASE/NIRSCHEL PROCEDURE    . TONSILLECTOMY    . TOTAL SHOULDER REPLACEMENT Bilateral     FAMILY HISTORY: The patient family history is not on file.   SOCIAL HISTORY:  The patient  reports that he has quit smoking. He has never used smokeless tobacco.  Review of Systems  Constitutional: Negative for chills and fever.  HENT: Negative for hoarse voice and nosebleeds.   Eyes: Negative for discharge, double vision and pain.  Cardiovascular: Negative for chest pain, claudication, dyspnea on exertion, leg swelling, near-syncope, orthopnea, palpitations, paroxysmal nocturnal dyspnea and syncope.  Respiratory: Negative for hemoptysis and shortness of breath.   Musculoskeletal: Negative for muscle cramps and myalgias.  Gastrointestinal: Negative for abdominal pain, constipation, diarrhea, hematemesis, hematochezia, melena, nausea and vomiting.  Neurological: Negative for dizziness and light-headedness.   PHYSICAL EXAM: Vitals with BMI 09/25/2020 02/05/2020 02/05/2020  Height 5\' 8"  - 5\' 8"   Weight 179 lbs - 180 lbs  BMI 85.88 - 50.27   Systolic 741 287 867  Diastolic 81 78 94  Pulse 77 85 88   CONSTITUTIONAL: Well-developed and well-nourished. No acute distress.  SKIN: Skin is warm and dry. No rash noted. No cyanosis. No pallor. No jaundice HEAD: Normocephalic and atraumatic.  EYES: No scleral icterus MOUTH/THROAT: Moist oral membranes.  NECK: No JVD present. No thyromegaly noted. No carotid bruits  LYMPHATIC: No visible cervical adenopathy.  CHEST Normal respiratory effort. No intercostal retractions  LUNGS: Clear to auscultation bilaterally.  No stridor. No wheezes. No rales.  CARDIOVASCULAR: Regular rate and rhythm, positive E7-M0, holosystolic murmur heard at left sternal border, no gallops or rubs appreciated ABDOMINAL: No apparent ascites. No bruit  EXTREMITIES: bilateral trace peripheral edema  HEMATOLOGIC: No significant bruising NEUROLOGIC: Oriented to person, place, and time. Nonfocal. Normal muscle tone.  PSYCHIATRIC: Normal mood and affect. Normal behavior. Cooperative  CARDIAC DATABASE: EKG: 09/25/2020: Normal sinus rhythm, 85 bpm, left axis deviation, right bundle branch block, first-degree AV block, occasional PACs.  Echocardiogram 06/27/2018: Left ventricle cavity is normal in size. Mild concentric hypertrophy of the left ventricle. Wall motion probably normal. Accurate assessment of LVEF and diastolic function limited due to multiple PVC's. Calculated EF 54%. Left atrial cavity is mildly dilated. Right atrial cavity is mildly dilated. Right ventricle cavity is mildly dilated. Normal right ventricular function. Moderate tricuspid regurgitation. Estimated pulmonary artery systolic pressure 43 mmHg. Mild to moderate pulmonic regurgitation. Compared to previous study on 07/27/2018, mild RV dilatation is new. PASP mildly increased.  Carotid artery duplex 07/27/2017: No hemodynamically significant arterial disease in the internal carotid artery bilaterally. Minimal soft plaque noted. Antegrade right  vertebral artery flow. Antegrade left vertebral artery flow.  Ultrasound abdominal aorta:  12/18/2018: The maximum aorta diameter is 2.1 cm (mid). No evidence of atherosclerotic plaque. Normal flow velocities noted. No AAA noted.  LABORATORY DATA: CBC Latest Ref Rng & Units 01/27/2020 07/22/2019 03/30/2010  WBC 4.0 - 10.5 K/uL 7.6 10.0 8.1  Hemoglobin 13.0 - 17.0 g/dL 15.6 14.9 16.1  Hematocrit 39 - 52 % 47.7 45.6 46.5  Platelets 150 - 400 K/uL 181 166 196    CMP Latest Ref Rng & Units 01/27/2020 07/22/2019 03/30/2010  Glucose 70 - 99 mg/dL 97 112(H) 117(H)  BUN 8 - 23 mg/dL 23 19 26(H)  Creatinine 0.61 - 1.24 mg/dL 1.21 1.17 1.38  Sodium 135 - 145 mmol/L 138 138 139  Potassium 3.5 - 5.1 mmol/L 4.2 3.7 4.1  Chloride 98 - 111 mmol/L 99 101 102  CO2 22 - 32 mmol/L 31 25 28   Calcium 8.9 - 10.3 mg/dL 10.1 9.6 9.6  Total Protein 6.5 - 8.1 g/dL 7.0 - 6.7  Total Bilirubin 0.3 - 1.2 mg/dL 0.8 - 0.9  Alkaline Phos 38 - 126 U/L 85 - 67  AST 15 - 41 U/L 23 - 20  ALT 0 - 44 U/L 20 - 20    Lipid Panel  12/13/2018: Total cholesterol 173, HDL 53, LDL 93, triglycerides 137.  FINAL MEDICATION LIST END OF ENCOUNTER: Meds ordered this encounter  Medications  . amLODipine (NORVASC) 5 MG tablet  Sig: Take 1 tablet (5 mg total) by mouth in the morning.    Dispense:  90 tablet    Refill:  0    Medications Discontinued During This Encounter  Medication Reason  . pregabalin (LYRICA) 630 MG capsule Duplicate  . latanoprost (XALATAN) 0.005 % ophthalmic solution Duplicate  . lidocaine (LIDODERM) 5 % Completed Course  . lisinopril-hydrochlorothiazide (ZESTORETIC) 10-12.5 MG tablet Patient Preference  . simethicone (GAS-X) 80 MG chewable tablet Completed Course  . amLODipine (NORVASC) 2.5 MG tablet Dose change     Current Outpatient Medications:  .  acetaminophen (TYLENOL) 325 MG tablet, Take 650 mg by mouth every 6 (six) hours as needed., Disp: , Rfl:  .  calcium carbonate (TUMS CHEWY BITES) 750 MG  chewable tablet, Chew 1 tablet by mouth as needed for heartburn., Disp: , Rfl:  .  docusate sodium (COLACE) 100 MG capsule, Take 1 capsule (100 mg total) by mouth every 12 (twelve) hours., Disp: 60 capsule, Rfl: 0 .  latanoprost (XALATAN) 0.005 % ophthalmic solution, INSTILL 1 DROP INTO BOTH EYES EVERY DAY AT NIGHT, Disp: , Rfl:  .  Multiple Vitamins-Minerals (MULTIVITAMIN WITH MINERALS) tablet, Take 1 tablet by mouth daily., Disp: , Rfl:  .  Multiple Vitamins-Minerals (PRESERVISION AREDS 2+MULTI VIT PO), Take 2 capsules by mouth daily., Disp: , Rfl:  .  pregabalin (LYRICA) 150 MG capsule, Take 150 mg by mouth 2 (two) times daily., Disp: , Rfl:  .  amLODipine (NORVASC) 5 MG tablet, Take 1 tablet (5 mg total) by mouth in the morning., Disp: 90 tablet, Rfl: 0  IMPRESSION:    ICD-10-CM   1. Benign hypertension with CKD (chronic kidney disease) stage III (HCC)  I12.9 EKG 12-Lead   N18.30 amLODipine (NORVASC) 5 MG tablet  2. PVC (premature ventricular contraction)  I49.3   3. Former smoker  Z87.891      RECOMMENDATIONS: Kyden Potash Sr. is a 86 y.o. male whose past medical history and cardiac risk factors include: hypertension with chronic kidney disease stage III, chronic venous insufficiency, PVCs, advanced age.  Benign essential hypertension with chronic kidney disease stage III:  Patient's blood pressure at today's office within acceptable range but above goal.  Asked him to keep a log of his blood pressures and to bring it in at the next office visit.  Given his age and other comorbid conditions would recommend a systolic blood pressures between 130-140 mmHg.  Shared decision was to increase the amlodipine to 5 mg p.o. daily.  Medication profile discussed.  If he has lower extremity swelling from amlodipine will consider ARB as an alternative.  Low-salt diet recommended.  History of premature ventricular contractions: Chronic and stable  Abdominal aortic aneurysm:  Patient  carries a diagnosis of abdominal aortic aneurysm.  However, based on the recent abdominal duplex from January 2020 and per report no AAA was noted and no evidence of atherosclerotic plaque.  Patient is educated on importance of complete smoking cessation.   Continue to monitor  Former smoker: Patient educated on the importance of continued smoking cessation.  Orders Placed This Encounter  Procedures  . EKG 12-Lead   --Continue cardiac medications as reconciled in final medication list. --Return in about 6 months (around 03/26/2021) for Reevaluation of, BP. . Or sooner if needed. --Continue follow-up with your primary care physician regarding the management of your other chronic comorbid conditions.  Patient's questions and concerns were addressed to his satisfaction. He voices understanding of the instructions provided during this encounter.  This note was created using a voice recognition software as a result there may be grammatical errors inadvertently enclosed that do not reflect the nature of this encounter. Every attempt is made to correct such errors.  Rex Kras, DO, [facility]. [place] Office: [phone]

## 2020-12-17 ENCOUNTER — Other Ambulatory Visit: Payer: Self-pay | Admitting: Cardiology

## 2020-12-17 DIAGNOSIS — N183 Chronic kidney disease, stage 3 unspecified: Secondary | ICD-10-CM

## 2020-12-18 DIAGNOSIS — H409 Unspecified glaucoma: Secondary | ICD-10-CM | POA: Diagnosis not present

## 2020-12-18 DIAGNOSIS — I129 Hypertensive chronic kidney disease with stage 1 through stage 4 chronic kidney disease, or unspecified chronic kidney disease: Secondary | ICD-10-CM | POA: Diagnosis not present

## 2020-12-18 DIAGNOSIS — E785 Hyperlipidemia, unspecified: Secondary | ICD-10-CM | POA: Diagnosis not present

## 2020-12-18 DIAGNOSIS — M4726 Other spondylosis with radiculopathy, lumbar region: Secondary | ICD-10-CM | POA: Diagnosis not present

## 2020-12-18 DIAGNOSIS — M47812 Spondylosis without myelopathy or radiculopathy, cervical region: Secondary | ICD-10-CM | POA: Diagnosis not present

## 2020-12-18 DIAGNOSIS — N183 Chronic kidney disease, stage 3 unspecified: Secondary | ICD-10-CM | POA: Diagnosis not present

## 2020-12-18 DIAGNOSIS — M150 Primary generalized (osteo)arthritis: Secondary | ICD-10-CM | POA: Diagnosis not present

## 2020-12-18 DIAGNOSIS — M47816 Spondylosis without myelopathy or radiculopathy, lumbar region: Secondary | ICD-10-CM | POA: Diagnosis not present

## 2020-12-18 DIAGNOSIS — I10 Essential (primary) hypertension: Secondary | ICD-10-CM | POA: Diagnosis not present

## 2021-02-03 DIAGNOSIS — Z85820 Personal history of malignant melanoma of skin: Secondary | ICD-10-CM | POA: Diagnosis not present

## 2021-02-03 DIAGNOSIS — Z0000 Encounter for general adult medical examination without abnormal findings: Secondary | ICD-10-CM | POA: Diagnosis not present

## 2021-02-03 DIAGNOSIS — M150 Primary generalized (osteo)arthritis: Secondary | ICD-10-CM | POA: Diagnosis not present

## 2021-02-03 DIAGNOSIS — N2889 Other specified disorders of kidney and ureter: Secondary | ICD-10-CM | POA: Diagnosis not present

## 2021-02-03 DIAGNOSIS — M25551 Pain in right hip: Secondary | ICD-10-CM | POA: Diagnosis not present

## 2021-02-03 DIAGNOSIS — E785 Hyperlipidemia, unspecified: Secondary | ICD-10-CM | POA: Diagnosis not present

## 2021-02-03 DIAGNOSIS — H409 Unspecified glaucoma: Secondary | ICD-10-CM | POA: Diagnosis not present

## 2021-02-04 ENCOUNTER — Other Ambulatory Visit (HOSPITAL_BASED_OUTPATIENT_CLINIC_OR_DEPARTMENT_OTHER): Payer: Self-pay | Admitting: Family Medicine

## 2021-02-04 DIAGNOSIS — N2889 Other specified disorders of kidney and ureter: Secondary | ICD-10-CM

## 2021-02-14 ENCOUNTER — Ambulatory Visit (HOSPITAL_BASED_OUTPATIENT_CLINIC_OR_DEPARTMENT_OTHER)
Admission: RE | Admit: 2021-02-14 | Discharge: 2021-02-14 | Disposition: A | Discharge status: Home / Self Care | Payer: Medicare Other | Source: Ambulatory Visit | Attending: Family Medicine | Admitting: Family Medicine

## 2021-02-14 ENCOUNTER — Other Ambulatory Visit: Payer: Self-pay

## 2021-02-14 DIAGNOSIS — N281 Cyst of kidney, acquired: Secondary | ICD-10-CM | POA: Diagnosis not present

## 2021-02-14 DIAGNOSIS — N2889 Other specified disorders of kidney and ureter: Secondary | ICD-10-CM | POA: Insufficient documentation

## 2021-02-14 MED ORDER — GADOBUTROL 1 MMOL/ML IV SOLN
8.0000 mL | Freq: Once | INTRAVENOUS | Status: AC | PRN
  Administered 2021-02-14: 8 mL via INTRAVENOUS

## 2021-03-12 ENCOUNTER — Other Ambulatory Visit: Payer: Self-pay | Admitting: Cardiology

## 2021-03-12 DIAGNOSIS — I129 Hypertensive chronic kidney disease with stage 1 through stage 4 chronic kidney disease, or unspecified chronic kidney disease: Secondary | ICD-10-CM

## 2021-03-18 DIAGNOSIS — D49512 Neoplasm of unspecified behavior of left kidney: Secondary | ICD-10-CM | POA: Diagnosis not present

## 2021-03-25 NOTE — Progress Notes (Signed)
Date: 03/27/21 Last Office Visit: 09/25/2020  Chief Complaint  Patient presents with  . Hypertension  . PVCs  . Follow-up    6 month    HPI  Shane Shane Edward Demetriou Sr. is a 87 y.o. male who presents to the office with a chief complaint of " 7768-month follow-up for management of hypertension, lower extremity swelling and PVC." Patient's past medical history and cardiac risk factors include: hypertension with chronic kidney disease stage III, chronic venous insufficiency, PVCs, advanced age.  Patient is accompanied by his daughter Shane Knight at today's office visit.  Benign essential hypertension: Patient presents for 3568-month follow-up for blood pressure management.  His home blood pressures are better controlled with SBP between 125-130 mmHg.  He is currently on amlodipine.  In the past he was recommended to be on lisinopril for its renal protective properties but patient chose to be on amlodipine.  At the last office visit we uptitrated amlodipine as per patient's discretion to help improve his blood pressure.  However, patient has at least 2+ pitting edema bilaterally.  In addition, patient though lives independently with close family surveillance both the patient and his daughter have noted that his physical endurance has relatively remained stable but at times appears to be more tired and fatigue some days are better than others.  They have been attributing this to age.  He denies any chest pain or shortness of breath at rest or with effort related activities.  ALLERGIES: Allergies  Allergen Reactions  . Tape Rash   MEDICATION LIST PRIOR TO VISIT: Current Outpatient Medications on File Prior to Visit  Medication Sig Dispense Refill  . acetaminophen (TYLENOL) 325 MG tablet Take 650 mg by mouth every 6 (six) hours as needed.    . bismuth subsalicylate (PEPTO-BISMOL) 262 MG/15ML suspension as needed.    . calcium carbonate (TUMS CHEWY BITES) 750 MG chewable tablet Chew 1 tablet by mouth  as needed for heartburn.    . latanoprost (XALATAN) 0.005 % ophthalmic solution INSTILL 1 DROP INTO BOTH EYES EVERY DAY AT NIGHT    . Multiple Vitamins-Minerals (MULTIVITAMIN WITH MINERALS) tablet Take 1 tablet by mouth daily.    . Multiple Vitamins-Minerals (PRESERVISION AREDS 2+MULTI VIT PO) Take 2 capsules by mouth daily.    Bertram Gala. Polyethyl Glycol-Propyl Glycol (SYSTANE) 0.4-0.3 % SOLN See admin instructions.    . polyethylene glycol (MIRALAX / GLYCOLAX) 17 g packet 1 packet mixed with 8 ounces of fluid    . pregabalin (LYRICA) 150 MG capsule Take 150 mg by mouth 2 (two) times daily.     No current facility-administered medications on file prior to visit.    PAST MEDICAL HISTORY: Past Medical History:  Diagnosis Date  . AAA (abdominal aortic aneurysm) ([facility])   . Chronic kidney disease   . Hyperlipemia   . Hypertension   . PVC (premature ventricular contraction)     PAST SURGICAL HISTORY: Past Surgical History:  Procedure Laterality Date  . APPENDECTOMY    . EYE SURGERY     lens implants  . HERNIA REPAIR    . TENNIS ELBOW RELEASE/NIRSCHEL PROCEDURE    . TONSILLECTOMY    . TOTAL SHOULDER REPLACEMENT Bilateral     FAMILY HISTORY: The patient family history is not on file.   SOCIAL HISTORY:  The patient  reports that he quit smoking about 61 years ago. His smoking use included cigarettes. He has a 30.00 pack-year smoking history. He has never used smokeless tobacco. He reports current alcohol use.  He reports that he does not use drugs.  Review of Systems  Constitutional: Positive for malaise/fatigue. Negative for chills and fever.  HENT: Negative for hoarse voice and nosebleeds.   Eyes: Negative for discharge, double vision and pain.  Cardiovascular: Negative for chest pain, claudication, dyspnea on exertion, leg swelling, near-syncope, orthopnea, palpitations, paroxysmal nocturnal dyspnea and syncope.  Respiratory: Negative for hemoptysis and shortness of breath.    Musculoskeletal: Negative for muscle cramps and myalgias.  Gastrointestinal: Negative for abdominal pain, constipation, diarrhea, hematemesis, hematochezia, melena, nausea and vomiting.  Neurological: Negative for dizziness and light-headedness.   PHYSICAL EXAM: Vitals with BMI 03/27/2021 03/27/2021 09/25/2020  Height - 5\' 8"  5\' 8"   Weight - 183 lbs 179 lbs  BMI - 70.26 37.85  Systolic 885 027 741  Diastolic 70 92 81  Pulse 51 72 77   CONSTITUTIONAL: Well-developed and well-nourished. No acute distress.  SKIN: Skin is warm and dry. No rash noted. No cyanosis. No pallor. No jaundice HEAD: Normocephalic and atraumatic.  EYES: No scleral icterus MOUTH/THROAT: Moist oral membranes.  NECK: No JVD present. No thyromegaly noted. No carotid bruits  LYMPHATIC: No visible cervical adenopathy.  CHEST Normal respiratory effort. No intercostal retractions  LUNGS: Clear to auscultation bilaterally.  No stridor. No wheezes. No rales.  CARDIOVASCULAR: Regular rate and rhythm, positive O8-N8, holosystolic murmur heard at left sternal border, no gallops or rubs appreciated ABDOMINAL: No apparent ascites. No bruit  EXTREMITIES: bilateral +2 peripheral edema  HEMATOLOGIC: No significant bruising NEUROLOGIC: Oriented to person, place, and time. Nonfocal. Normal muscle tone.  PSYCHIATRIC: Normal mood and affect. Normal behavior. Cooperative  CARDIAC DATABASE: EKG: 03/27/2021: Normal sinus rhythm, 86 bpm, first-degree AV block, right bundle branch block, left axis deviation, left anterior fascicular block, frequent PVCs.  Echocardiogram 06/27/2018: Left ventricle cavity is normal in size. Mild concentric hypertrophy of the left ventricle. Wall motion probably normal. Accurate assessment of LVEF and diastolic function limited due to multiple PVC's. Calculated EF 54%. Left atrial cavity is mildly dilated. Right atrial cavity is mildly dilated. Right ventricle cavity is mildly dilated. Normal right  ventricular function. Moderate tricuspid regurgitation. Estimated pulmonary artery systolic pressure 43 mmHg. Mild to moderate pulmonic regurgitation. Compared to previous study on 07/27/2018, mild RV dilatation is new. PASP mildly increased.  Carotid artery duplex 07/27/2017: No hemodynamically significant arterial disease in the internal carotid artery bilaterally. Minimal soft plaque noted. Antegrade right vertebral artery flow. Antegrade left vertebral artery flow.  Ultrasound abdominal aorta:  12/18/2018: The maximum aorta diameter is 2.1 cm (mid). No evidence of atherosclerotic plaque. Normal flow velocities noted. No AAA noted.  LABORATORY DATA: CBC Latest Ref Rng & Units 01/27/2020 07/22/2019 03/30/2010  WBC 4.0 - 10.5 K/uL 7.6 10.0 8.1  Hemoglobin 13.0 - 17.0 g/dL 15.6 14.9 16.1  Hematocrit 39.0 - 52.0 % 47.7 45.6 46.5  Platelets 150 - 400 K/uL 181 166 196    CMP Latest Ref Rng & Units 01/27/2020 07/22/2019 03/30/2010  Glucose 70 - 99 mg/dL 97 112(H) 117(H)  BUN 8 - 23 mg/dL 23 19 26(H)  Creatinine 0.61 - 1.24 mg/dL 1.21 1.17 1.38  Sodium 135 - 145 mmol/L 138 138 139  Potassium 3.5 - 5.1 mmol/L 4.2 3.7 4.1  Chloride 98 - 111 mmol/L 99 101 102  CO2 22 - 32 mmol/L 31 25 28   Calcium 8.9 - 10.3 mg/dL 10.1 9.6 9.6  Total Protein 6.5 - 8.1 g/dL 7.0 - 6.7  Total Bilirubin 0.3 - 1.2 mg/dL 0.8 - 0.9  Alkaline Phos 38 - 126 U/L 85 - 67  AST 15 - 41 U/L 23 - 20  ALT 0 - 44 U/L 20 - 20    Lipid Panel  12/13/2018: Total cholesterol 173, HDL 53, LDL 93, triglycerides 137.  02/03/2021: Total cholesterol 155, triglycerides 88, HDL 55, LDL 83, non-HDL 100. Hemoglobin 14.8 g/dL, hematocrit 44.6% BUN 32, creatinine 1.22 g/dL, GFR 55. Sodium 144, potassium 4.1, chloride 106, bicarb 32, AST 16, ALT 16, alkaline phosphatase 79  FINAL MEDICATION LIST END OF ENCOUNTER: Meds ordered this encounter  Medications  . metoprolol succinate (TOPROL XL) 25 MG 24 hr tablet    Sig: Take 0.5 tablets  (12.5 mg total) by mouth every morning.    Dispense:  45 tablet    Refill:  0  . hydrochlorothiazide (MICROZIDE) 12.5 MG capsule    Sig: Take 1 capsule (12.5 mg total) by mouth in the morning.    Dispense:  30 capsule    Refill:  0    Medications Discontinued During This Encounter  Medication Reason  . docusate sodium (COLACE) 100 MG capsule Error  . amLODipine (NORVASC) 5 MG tablet Change in therapy     Current Outpatient Medications:  .  acetaminophen (TYLENOL) 325 MG tablet, Take 650 mg by mouth every 6 (six) hours as needed., Disp: , Rfl:  .  bismuth subsalicylate (PEPTO-BISMOL) 262 MG/15ML suspension, as needed., Disp: , Rfl:  .  calcium carbonate (TUMS CHEWY BITES) 750 MG chewable tablet, Chew 1 tablet by mouth as needed for heartburn., Disp: , Rfl:  .  hydrochlorothiazide (MICROZIDE) 12.5 MG capsule, Take 1 capsule (12.5 mg total) by mouth in the morning., Disp: 30 capsule, Rfl: 0 .  latanoprost (XALATAN) 0.005 % ophthalmic solution, INSTILL 1 DROP INTO BOTH EYES EVERY DAY AT NIGHT, Disp: , Rfl:  .  metoprolol succinate (TOPROL XL) 25 MG 24 hr tablet, Take 0.5 tablets (12.5 mg total) by mouth every morning., Disp: 45 tablet, Rfl: 0 .  Multiple Vitamins-Minerals (MULTIVITAMIN WITH MINERALS) tablet, Take 1 tablet by mouth daily., Disp: , Rfl:  .  Multiple Vitamins-Minerals (PRESERVISION AREDS 2+MULTI VIT PO), Take 2 capsules by mouth daily., Disp: , Rfl:  .  Polyethyl Glycol-Propyl Glycol (SYSTANE) 0.4-0.3 % SOLN, See admin instructions., Disp: , Rfl:  .  polyethylene glycol (MIRALAX / GLYCOLAX) 17 g packet, 1 packet mixed with 8 ounces of fluid, Disp: , Rfl:  .  pregabalin (LYRICA) 150 MG capsule, Take 150 mg by mouth 2 (two) times daily., Disp: , Rfl:   IMPRESSION:    ICD-10-CM   1. Benign hypertension with CKD (chronic kidney disease) stage III (HCC)  I12.9 EKG 12-Lead   N18.30 PCV ECHOCARDIOGRAM COMPLETE    hydrochlorothiazide (MICROZIDE) 12.5 MG capsule    Basic metabolic  panel    Magnesium  2. PVC (premature ventricular contraction)  I49.3 metoprolol succinate (TOPROL XL) 25 MG 24 hr tablet    PCV ECHOCARDIOGRAM COMPLETE  3. Former smoker  Z87.891   4. Shortness of breath  R06.02 PCV ECHOCARDIOGRAM COMPLETE     RECOMMENDATIONS: Json Koelzer Sr. is a 87 y.o. male whose past medical history and cardiac risk factors include: hypertension with chronic kidney disease stage III, chronic venous insufficiency, PVCs, advanced age.  Benign essential hypertension with chronic kidney disease stage III:  Discontinue amlodipine due to lower extremity swelling.  Start hydrochlorothiazide 12.5 mg p.o. daily   Blood work in 1 week to evaluate kidney function and electrolytes as he started on  diuretic therapy.  Low-salt diet recommended  Recommend a goal systolic blood pressure between 130-140 mmHg  Low-salt diet recommended.  Lower extremity swelling:  Multifactorial: Chronic venous insufficiency, medication induced, ?  CHF  Discontinue amlodipine  Start hydrochlorothiazide  Low-salt diet recommended  Elevate the legs when possible  Compression stockings for approximately 8 hours a day -   Especially with prolonged standing  We will check echo.  History of premature ventricular contractions:   Chronic and stable  Given his symptoms and LVEF I suspect that he may have reduced LVEF, diastolic dysfunction, or change in the severity of valvular heart disease.    Patient is noted to have underlying first-degree AV block and bifascicular block as well.  Echocardiogram will be ordered to evaluate for structural heart disease and left ventricular systolic function.  Start Toprol-XL 12.5 mg p.o. daily  Abdominal aortic aneurysm:  Patient carries a diagnosis of abdominal aortic aneurysm.  However, based on the recent abdominal duplex from January 2020 and per report no AAA was noted and no evidence of atherosclerotic plaque.  Patient is educated on  importance of complete smoking cessation.   Continue to monitor  Former smoker: Patient educated on the importance of continued smoking cessation.  Outside labs independently reviewed dated 02/03/2021  Orders Placed This Encounter  Procedures  . Basic metabolic panel  . Magnesium  . EKG 12-Lead  . PCV ECHOCARDIOGRAM COMPLETE   --Continue cardiac medications as reconciled in final medication list. --Return in about 4 weeks (around 04/24/2021) for Follow up, BP, LE swelling. Or sooner if needed. --Continue follow-up with your primary care physician regarding the management of your other chronic comorbid conditions.  Patient's questions and concerns were addressed to his satisfaction. He voices understanding of the instructions provided during this encounter.   This note was created using a voice recognition software as a result there may be grammatical errors inadvertently enclosed that do not reflect the nature of this encounter. Every attempt is made to correct such errors.  Rex Kras, [place], [facility]  Pager: [phone] Office: [phone]

## 2021-03-27 ENCOUNTER — Other Ambulatory Visit: Payer: Self-pay

## 2021-03-27 ENCOUNTER — Ambulatory Visit: Payer: Medicare Other | Admitting: Cardiology

## 2021-03-27 ENCOUNTER — Encounter: Payer: Self-pay | Admitting: Cardiology

## 2021-03-27 VITALS — BP 149/70 | HR 51 | Temp 98.3°F | Resp 16 | Ht 68.0 in | Wt 183.0 lb

## 2021-03-27 DIAGNOSIS — R0602 Shortness of breath: Secondary | ICD-10-CM | POA: Diagnosis not present

## 2021-03-27 DIAGNOSIS — I493 Ventricular premature depolarization: Secondary | ICD-10-CM | POA: Diagnosis not present

## 2021-03-27 DIAGNOSIS — Z87891 Personal history of nicotine dependence: Secondary | ICD-10-CM

## 2021-03-27 DIAGNOSIS — I129 Hypertensive chronic kidney disease with stage 1 through stage 4 chronic kidney disease, or unspecified chronic kidney disease: Secondary | ICD-10-CM

## 2021-03-27 DIAGNOSIS — N183 Chronic kidney disease, stage 3 unspecified: Secondary | ICD-10-CM | POA: Diagnosis not present

## 2021-03-27 MED ORDER — HYDROCHLOROTHIAZIDE 12.5 MG PO CAPS: 12.5000 mg | ORAL_CAPSULE | Freq: Every morning | ORAL | 0 refills | Status: DC

## 2021-03-27 MED ORDER — METOPROLOL SUCCINATE ER 25 MG PO TB24: 12.5000 mg | ORAL_TABLET | Freq: Every morning | ORAL | 0 refills | Status: DC

## 2021-03-30 ENCOUNTER — Telehealth: Payer: Self-pay

## 2021-03-30 NOTE — Telephone Encounter (Signed)
-----   Message from [place], [place] sent at 03/29/2021  4:26 PM EDT ----- Regarding: Call the patient To Medical Assistants:  Please call the patient with the following information:  Please call the patient's daughter Judson Roch to inform her to have blood work done in 1 week after starting the new medications.  Labs have been ordered and placed for Apr 06, 2021.  Nonfasting.  Regards, Sunit Tolia, DO

## 2021-03-30 NOTE — Telephone Encounter (Signed)
Patient is aware to get blood work 1 week after starting new meds.

## 2021-03-31 DIAGNOSIS — Z1283 Encounter for screening for malignant neoplasm of skin: Secondary | ICD-10-CM | POA: Diagnosis not present

## 2021-03-31 DIAGNOSIS — Z85820 Personal history of malignant melanoma of skin: Secondary | ICD-10-CM | POA: Diagnosis not present

## 2021-03-31 DIAGNOSIS — L570 Actinic keratosis: Secondary | ICD-10-CM | POA: Diagnosis not present

## 2021-04-07 ENCOUNTER — Ambulatory Visit: Payer: Medicare Other

## 2021-04-07 ENCOUNTER — Other Ambulatory Visit: Payer: Self-pay

## 2021-04-07 DIAGNOSIS — R0602 Shortness of breath: Secondary | ICD-10-CM | POA: Diagnosis not present

## 2021-04-07 DIAGNOSIS — I493 Ventricular premature depolarization: Secondary | ICD-10-CM

## 2021-04-07 DIAGNOSIS — I129 Hypertensive chronic kidney disease with stage 1 through stage 4 chronic kidney disease, or unspecified chronic kidney disease: Secondary | ICD-10-CM | POA: Diagnosis not present

## 2021-04-18 ENCOUNTER — Other Ambulatory Visit: Payer: Self-pay | Admitting: Cardiology

## 2021-04-18 DIAGNOSIS — N183 Chronic kidney disease, stage 3 unspecified: Secondary | ICD-10-CM

## 2021-04-18 DIAGNOSIS — I129 Hypertensive chronic kidney disease with stage 1 through stage 4 chronic kidney disease, or unspecified chronic kidney disease: Secondary | ICD-10-CM

## 2021-04-23 ENCOUNTER — Other Ambulatory Visit: Payer: Self-pay | Admitting: Cardiology

## 2021-04-23 DIAGNOSIS — N29 Other disorders of kidney and ureter in diseases classified elsewhere: Secondary | ICD-10-CM | POA: Diagnosis not present

## 2021-04-23 DIAGNOSIS — N183 Chronic kidney disease, stage 3 unspecified: Secondary | ICD-10-CM | POA: Diagnosis not present

## 2021-04-24 LAB — BASIC METABOLIC PANEL
BUN/Creatinine Ratio: 18 (ref 10–24)
BUN: 22 mg/dL (ref 10–36)
CO2: 28 mmol/L (ref 20–29)
Calcium: 10.2 mg/dL (ref 8.6–10.2)
Chloride: 102 mmol/L (ref 96–106)
Creatinine, Ser: 1.22 mg/dL (ref 0.76–1.27)
Glucose: 76 mg/dL (ref 65–99)
Potassium: 4.8 mmol/L (ref 3.5–5.2)
Sodium: 146 mmol/L — ABNORMAL HIGH (ref 134–144)
eGFR: 55 mL/min/{1.73_m2} — ABNORMAL LOW (ref >59)

## 2021-04-24 LAB — MAGNESIUM: Magnesium: 2.3 mg/dL (ref 1.6–2.3)

## 2021-04-30 ENCOUNTER — Telehealth: Payer: Self-pay

## 2021-04-30 ENCOUNTER — Other Ambulatory Visit: Payer: Self-pay

## 2021-04-30 NOTE — Telephone Encounter (Signed)
erro

## 2021-04-30 NOTE — Telephone Encounter (Signed)
Patient daughter Judson Roch called and is asking if patient needs to continue HCTZ because it did not have any refills, or does he just wait until his office visit on the 6th for additional refills? Please advise.

## 2021-05-01 NOTE — Telephone Encounter (Signed)
Patient daughter said she asked him(patient), and he has not had any swelling that he knows of. They have picked up a new Rx from the pharmacy.

## 2021-05-01 NOTE — Progress Notes (Signed)
Called and spoke with patient regarding his lab results.

## 2021-05-04 ENCOUNTER — Ambulatory Visit: Payer: Medicare Other | Admitting: Cardiology

## 2021-05-04 ENCOUNTER — Encounter: Payer: Self-pay | Admitting: Cardiology

## 2021-05-04 ENCOUNTER — Other Ambulatory Visit: Payer: Self-pay

## 2021-05-04 VITALS — BP 131/72 | HR 70 | Temp 98.2°F | Resp 16 | Ht 68.0 in | Wt 179.6 lb

## 2021-05-04 DIAGNOSIS — N183 Chronic kidney disease, stage 3 unspecified: Secondary | ICD-10-CM | POA: Diagnosis not present

## 2021-05-04 DIAGNOSIS — I129 Hypertensive chronic kidney disease with stage 1 through stage 4 chronic kidney disease, or unspecified chronic kidney disease: Secondary | ICD-10-CM | POA: Diagnosis not present

## 2021-05-04 DIAGNOSIS — Z87891 Personal history of nicotine dependence: Secondary | ICD-10-CM

## 2021-05-04 DIAGNOSIS — R0602 Shortness of breath: Secondary | ICD-10-CM

## 2021-05-04 DIAGNOSIS — I493 Ventricular premature depolarization: Secondary | ICD-10-CM | POA: Diagnosis not present

## 2021-05-04 MED ORDER — METOPROLOL SUCCINATE ER 25 MG PO TB24: 12.5000 mg | ORAL_TABLET | Freq: Every morning | ORAL | 1 refills | Status: DC

## 2021-05-04 MED ORDER — HYDROCHLOROTHIAZIDE 12.5 MG PO CAPS: 12.5000 mg | ORAL_CAPSULE | Freq: Every morning | ORAL | 1 refills | Status: DC

## 2021-05-04 NOTE — Progress Notes (Signed)
Date: 05/04/21 Last Office Visit: 03/27/2021  Chief Complaint  Patient presents with  . Edema  . Follow-up  . Results    HPI  Shane Kloepfer Sr. is a 87 y.o. male who presents to the office with a chief complaint of " 1 month follow-up for reevaluation of lower extremity swelling and review test results." Patient's past medical history and cardiac risk factors include: hypertension with chronic kidney disease stage III, chronic venous insufficiency, PVCs, advanced age.  Patient is accompanied by his daughter Shane Knight at today's office visit.  At the last office visit patient was noted to have bilateral lower extremity swelling 2+ and also was complaining of shortness of breath.  His Norvasc was discontinued.  He was started on hydrochlorothiazide 12.5 mg p.o. every morning and an echocardiogram was ordered to evaluate for structural heart disease.  He now presents for follow-up.  He does not have any lower extremity swelling.  He denies any chest pain or shortness of breath.  He is tolerated the initiation of medications well without any side effects.  Repeat blood work independently reviewed from 04/14/2021.   Office blood pressures are currently at goal.  Home blood pressure log reviewed.  No hospitalizations or urgent care visits since last office encounter.  ALLERGIES: Allergies  Allergen Reactions  . Tape Rash   MEDICATION LIST PRIOR TO VISIT: Current Outpatient Medications on File Prior to Visit  Medication Sig Dispense Refill  . acetaminophen (TYLENOL) 325 MG tablet Take 650 mg by mouth every 6 (six) hours as needed.    . bismuth subsalicylate (PEPTO-BISMOL) 262 MG/15ML suspension as needed.    . calcium carbonate (TUMS CHEWY BITES) 750 MG chewable tablet Chew 1 tablet by mouth as needed for heartburn.    . latanoprost (XALATAN) 0.005 % ophthalmic solution INSTILL 1 DROP INTO BOTH EYES EVERY DAY AT NIGHT    . Multiple Vitamins-Minerals (MULTIVITAMIN WITH MINERALS)  tablet Take 1 tablet by mouth daily.    . Multiple Vitamins-Minerals (PRESERVISION AREDS 2+MULTI VIT PO) Take 2 capsules by mouth daily.    Shane Knight Glycol-Propyl Glycol (SYSTANE) 0.4-0.3 % SOLN See admin instructions.    . polyethylene glycol (MIRALAX / GLYCOLAX) 17 g packet 1 packet mixed with 8 ounces of fluid    . pregabalin (LYRICA) 150 MG capsule Take 150 mg by mouth 2 (two) times daily.     No current facility-administered medications on file prior to visit.    PAST MEDICAL HISTORY: Past Medical History:  Diagnosis Date  . AAA (abdominal aortic aneurysm) ([place])   . Chronic kidney disease   . Hyperlipemia   . Hypertension   . PVC (premature ventricular contraction)     PAST SURGICAL HISTORY: Past Surgical History:  Procedure Laterality Date  . APPENDECTOMY    . EYE SURGERY     lens implants  . HERNIA REPAIR    . TENNIS ELBOW RELEASE/NIRSCHEL PROCEDURE    . TONSILLECTOMY    . TOTAL SHOULDER REPLACEMENT Bilateral     FAMILY HISTORY: The patient family history is not on file.   SOCIAL HISTORY:  The patient  reports that he quit smoking about 61 years ago. His smoking use included cigarettes. He has a 30.00 pack-year smoking history. He has never used smokeless tobacco. He reports current alcohol use. He reports that he does not use drugs.  Review of Systems  Constitutional: Negative for chills, fever and malaise/fatigue.  HENT: Negative for hoarse voice and nosebleeds.   Eyes: Negative  for discharge, double vision and pain.  Cardiovascular: Negative for chest pain, claudication, dyspnea on exertion, leg swelling, near-syncope, orthopnea, palpitations, paroxysmal nocturnal dyspnea and syncope.  Respiratory: Negative for hemoptysis and shortness of breath.   Musculoskeletal: Negative for muscle cramps and myalgias.  Gastrointestinal: Negative for abdominal pain, constipation, diarrhea, hematemesis, hematochezia, melena, nausea and vomiting.  Neurological: Negative for  dizziness and light-headedness.   PHYSICAL EXAM: Vitals with BMI 05/04/2021 03/27/2021 03/27/2021  Height 5\' 8"  - 5\' 8"   Weight 179 lbs 10 oz - 183 lbs  BMI 56.81 - 27.51  Systolic 700 174 944  Diastolic 72 70 92  Pulse 70 51 72   CONSTITUTIONAL: Well-developed and well-nourished. No acute distress.  SKIN: Skin is warm and dry. No rash noted. No cyanosis. No pallor. No jaundice HEAD: Normocephalic and atraumatic.  EYES: No scleral icterus MOUTH/THROAT: Moist oral membranes.  NECK: No JVD present. No thyromegaly noted. No carotid bruits  LYMPHATIC: No visible cervical adenopathy.  CHEST Normal respiratory effort. No intercostal retractions  LUNGS: Clear to auscultation bilaterally.  No stridor. No wheezes. No rales.  CARDIOVASCULAR: Regular rate and rhythm, positive H6-P5, holosystolic murmur heard at left sternal border, no gallops or rubs appreciated ABDOMINAL: No apparent ascites. No bruit  EXTREMITIES: Trace peripheral edema  HEMATOLOGIC: No significant bruising NEUROLOGIC: Oriented to person, place, and time. Nonfocal. Normal muscle tone.  PSYCHIATRIC: Normal mood and affect. Normal behavior. Cooperative  CARDIAC DATABASE: EKG: 03/27/2021: Normal sinus rhythm, 86 bpm, first-degree AV block, right bundle branch block, left axis deviation, left anterior fascicular block, frequent PVCs.  Echocardiogram: 04/07/2021:  Left ventricle cavity is normal in size. Moderate concentric hypertrophy of the left ventricle. Normal global wall motion. Normal LV systolic function with EF 60%. Doppler evidence of grade I (impaired) diastolic dysfunction, normal LAP.  Right ventricle cavity is mildly dilated.  Normal right ventricular function.  Moderate tricuspid regurgitation. Peak RA-RV gradient 27 mmHg.  Mild pulmonic regurgitation.  IVC not seen.  No significant change compared to previous study in 2019.   Carotid artery duplex 07/27/2017: No hemodynamically significant arterial disease in  the internal carotid artery bilaterally. Minimal soft plaque noted. Antegrade right vertebral artery flow. Antegrade left vertebral artery flow.  Ultrasound abdominal aorta:  12/18/2018: The maximum aorta diameter is 2.1 cm (mid). No evidence of atherosclerotic plaque. Normal flow velocities noted. No AAA noted.  LABORATORY DATA: CBC Latest Ref Rng & Units 01/27/2020 07/22/2019 03/30/2010  WBC 4.0 - 10.5 K/uL 7.6 10.0 8.1  Hemoglobin 13.0 - 17.0 g/dL 15.6 14.9 16.1  Hematocrit 39.0 - 52.0 % 47.7 45.6 46.5  Platelets 150 - 400 K/uL 181 166 196    CMP Latest Ref Rng & Units 04/23/2021 01/27/2020 07/22/2019  Glucose 65 - 99 mg/dL 76 97 112(H)  BUN 10 - 36 mg/dL 22 23 19   Creatinine 0.76 - 1.27 mg/dL 1.22 1.21 1.17  Sodium 134 - 144 mmol/L 146(H) 138 138  Potassium 3.5 - 5.2 mmol/L 4.8 4.2 3.7  Chloride 96 - 106 mmol/L 102 99 101  CO2 20 - 29 mmol/L 28 31 25   Calcium 8.6 - 10.2 mg/dL 10.2 10.1 9.6  Total Protein 6.5 - 8.1 g/dL - 7.0 -  Total Bilirubin 0.3 - 1.2 mg/dL - 0.8 -  Alkaline Phos 38 - 126 U/L - 85 -  AST 15 - 41 U/L - 23 -  ALT 0 - 44 U/L - 20 -    Lipid Panel  12/13/2018: Total cholesterol 173, HDL 53, LDL 93,  triglycerides 137.  02/03/2021: Total cholesterol 155, triglycerides 88, HDL 55, LDL 83, non-HDL 100. Hemoglobin 14.8 g/dL, hematocrit 44.6% BUN 32, creatinine 1.22 g/dL, GFR 55. Sodium 144, potassium 4.1, chloride 106, bicarb 32, AST 16, ALT 16, alkaline phosphatase 79  FINAL MEDICATION LIST END OF ENCOUNTER: Meds ordered this encounter  Medications  . hydrochlorothiazide (MICROZIDE) 12.5 MG capsule    Sig: Take 1 capsule (12.5 mg total) by mouth in the morning.    Dispense:  90 capsule    Refill:  1  . metoprolol succinate (TOPROL XL) 25 MG 24 hr tablet    Sig: Take 0.5 tablets (12.5 mg total) by mouth every morning.    Dispense:  45 tablet    Refill:  1    Medications Discontinued During This Encounter  Medication Reason  . fluorouracil (EFUDEX) 5 % cream  Error  . metoprolol succinate (TOPROL XL) 25 MG 24 hr tablet Reorder  . hydrochlorothiazide (MICROZIDE) 12.5 MG capsule Reorder     Current Outpatient Medications:  .  acetaminophen (TYLENOL) 325 MG tablet, Take 650 mg by mouth every 6 (six) hours as needed., Disp: , Rfl:  .  bismuth subsalicylate (PEPTO-BISMOL) 262 MG/15ML suspension, as needed., Disp: , Rfl:  .  calcium carbonate (TUMS CHEWY BITES) 750 MG chewable tablet, Chew 1 tablet by mouth as needed for heartburn., Disp: , Rfl:  .  latanoprost (XALATAN) 0.005 % ophthalmic solution, INSTILL 1 DROP INTO BOTH EYES EVERY DAY AT NIGHT, Disp: , Rfl:  .  Multiple Vitamins-Minerals (MULTIVITAMIN WITH MINERALS) tablet, Take 1 tablet by mouth daily., Disp: , Rfl:  .  Multiple Vitamins-Minerals (PRESERVISION AREDS 2+MULTI VIT PO), Take 2 capsules by mouth daily., Disp: , Rfl:  .  Polyethyl Glycol-Propyl Glycol (SYSTANE) 0.4-0.3 % SOLN, See admin instructions., Disp: , Rfl:  .  polyethylene glycol (MIRALAX / GLYCOLAX) 17 g packet, 1 packet mixed with 8 ounces of fluid, Disp: , Rfl:  .  pregabalin (LYRICA) 150 MG capsule, Take 150 mg by mouth 2 (two) times daily., Disp: , Rfl:  .  hydrochlorothiazide (MICROZIDE) 12.5 MG capsule, Take 1 capsule (12.5 mg total) by mouth in the morning., Disp: 90 capsule, Rfl: 1 .  metoprolol succinate (TOPROL XL) 25 MG 24 hr tablet, Take 0.5 tablets (12.5 mg total) by mouth every morning., Disp: 45 tablet, Rfl: 1  IMPRESSION:    ICD-10-CM   1. Benign hypertension with CKD (chronic kidney disease) stage III (HCC)  I12.9 hydrochlorothiazide (MICROZIDE) 12.5 MG capsule   N18.30   2. PVC (premature ventricular contraction)  I49.3 metoprolol succinate (TOPROL XL) 25 MG 24 hr tablet  3. Former smoker  Z87.891   4. Shortness of breath  R06.02      RECOMMENDATIONS: Zared Knoth Sr. is a 87 y.o. male whose past medical history and cardiac risk factors include: hypertension with chronic kidney disease stage III,  chronic venous insufficiency, PVCs, advanced age.  Lower extremity swelling:  Improved.  Will refill hydrochlorothiazide 12.5 mg p.o. every morning.  Independently reviewed labs from 04/14/2021.  Discontinue amlodipine-long-term.    Remeasured for compression stockings.    Hold Norvasc for now.   History of premature ventricular contractions:   Chronic and stable  Refilled Toprol XL   Echo notes preserved LVEF.   Benign essential hypertension with chronic kidney disease stage III:  Office BP are at goal.   Medications reconciled.   Low-salt diet recommended  Recommend a goal systolic blood pressure between 130-140 mmHg  Abdominal aortic  aneurysm:  Patient carries a diagnosis of abdominal aortic aneurysm.    However, based on the recent abdominal duplex from January 2020  no AAA was noted and no evidence of atherosclerotic plaque.  Patient is educated on importance of complete smoking cessation.   Continue to monitor  Former smoker: Patient educated on the importance of continued smoking cessation.  No orders of the defined types were placed in this encounter.  --Continue cardiac medications as reconciled in final medication list. --Return in about 6 months (around 11/03/2021) for Follow up BP, PVCs. Or sooner if needed. --Continue follow-up with your primary care physician regarding the management of your other chronic comorbid conditions.  Patient's questions and concerns were addressed to his satisfaction. He voices understanding of the instructions provided during this encounter.   This note was created using a voice recognition software as a result there may be grammatical errors inadvertently enclosed that do not reflect the nature of this encounter. Every attempt is made to correct such errors.  Rex Kras, [place], [facility]  Pager: [phone] Office: [phone]

## 2021-05-07 DIAGNOSIS — N183 Chronic kidney disease, stage 3 unspecified: Secondary | ICD-10-CM | POA: Diagnosis not present

## 2021-05-07 DIAGNOSIS — H409 Unspecified glaucoma: Secondary | ICD-10-CM | POA: Diagnosis not present

## 2021-05-07 DIAGNOSIS — E785 Hyperlipidemia, unspecified: Secondary | ICD-10-CM | POA: Diagnosis not present

## 2021-05-07 DIAGNOSIS — I129 Hypertensive chronic kidney disease with stage 1 through stage 4 chronic kidney disease, or unspecified chronic kidney disease: Secondary | ICD-10-CM | POA: Diagnosis not present

## 2021-05-07 DIAGNOSIS — M4726 Other spondylosis with radiculopathy, lumbar region: Secondary | ICD-10-CM | POA: Diagnosis not present

## 2021-05-07 DIAGNOSIS — M47816 Spondylosis without myelopathy or radiculopathy, lumbar region: Secondary | ICD-10-CM | POA: Diagnosis not present

## 2021-05-07 DIAGNOSIS — M150 Primary generalized (osteo)arthritis: Secondary | ICD-10-CM | POA: Diagnosis not present

## 2021-05-07 DIAGNOSIS — M47812 Spondylosis without myelopathy or radiculopathy, cervical region: Secondary | ICD-10-CM | POA: Diagnosis not present

## 2021-05-07 DIAGNOSIS — I10 Essential (primary) hypertension: Secondary | ICD-10-CM | POA: Diagnosis not present

## 2021-05-14 ENCOUNTER — Other Ambulatory Visit: Payer: Self-pay

## 2021-05-14 DIAGNOSIS — I129 Hypertensive chronic kidney disease with stage 1 through stage 4 chronic kidney disease, or unspecified chronic kidney disease: Secondary | ICD-10-CM

## 2021-05-26 DIAGNOSIS — L570 Actinic keratosis: Secondary | ICD-10-CM | POA: Diagnosis not present

## 2021-05-28 DIAGNOSIS — H353122 Nonexudative age-related macular degeneration, left eye, intermediate dry stage: Secondary | ICD-10-CM | POA: Diagnosis not present

## 2021-05-28 DIAGNOSIS — H4051X1 Glaucoma secondary to other eye disorders, right eye, mild stage: Secondary | ICD-10-CM | POA: Diagnosis not present

## 2021-05-28 DIAGNOSIS — H43813 Vitreous degeneration, bilateral: Secondary | ICD-10-CM | POA: Diagnosis not present

## 2021-05-28 DIAGNOSIS — Z9841 Cataract extraction status, right eye: Secondary | ICD-10-CM | POA: Diagnosis not present

## 2021-05-28 DIAGNOSIS — Z9842 Cataract extraction status, left eye: Secondary | ICD-10-CM | POA: Diagnosis not present

## 2021-05-28 DIAGNOSIS — H4089 Other specified glaucoma: Secondary | ICD-10-CM | POA: Diagnosis not present

## 2021-05-28 DIAGNOSIS — Z961 Presence of intraocular lens: Secondary | ICD-10-CM | POA: Diagnosis not present

## 2021-05-28 DIAGNOSIS — H353211 Exudative age-related macular degeneration, right eye, with active choroidal neovascularization: Secondary | ICD-10-CM | POA: Diagnosis not present

## 2021-06-29 ENCOUNTER — Telehealth: Payer: Self-pay

## 2021-07-09 DIAGNOSIS — H35363 Drusen (degenerative) of macula, bilateral: Secondary | ICD-10-CM | POA: Diagnosis not present

## 2021-07-09 DIAGNOSIS — H353211 Exudative age-related macular degeneration, right eye, with active choroidal neovascularization: Secondary | ICD-10-CM | POA: Diagnosis not present

## 2021-07-15 NOTE — Telephone Encounter (Signed)
TY for following up.

## 2021-07-15 NOTE — Telephone Encounter (Signed)
Could you please up with the patient and see if he continues to have pain?

## 2021-07-18 DIAGNOSIS — I10 Essential (primary) hypertension: Secondary | ICD-10-CM | POA: Diagnosis not present

## 2021-07-18 DIAGNOSIS — M47816 Spondylosis without myelopathy or radiculopathy, lumbar region: Secondary | ICD-10-CM | POA: Diagnosis not present

## 2021-07-18 DIAGNOSIS — E785 Hyperlipidemia, unspecified: Secondary | ICD-10-CM | POA: Diagnosis not present

## 2021-07-18 DIAGNOSIS — N183 Chronic kidney disease, stage 3 unspecified: Secondary | ICD-10-CM | POA: Diagnosis not present

## 2021-07-18 DIAGNOSIS — M4726 Other spondylosis with radiculopathy, lumbar region: Secondary | ICD-10-CM | POA: Diagnosis not present

## 2021-07-18 DIAGNOSIS — H409 Unspecified glaucoma: Secondary | ICD-10-CM | POA: Diagnosis not present

## 2021-07-18 DIAGNOSIS — M47812 Spondylosis without myelopathy or radiculopathy, cervical region: Secondary | ICD-10-CM | POA: Diagnosis not present

## 2021-07-18 DIAGNOSIS — I129 Hypertensive chronic kidney disease with stage 1 through stage 4 chronic kidney disease, or unspecified chronic kidney disease: Secondary | ICD-10-CM | POA: Diagnosis not present

## 2021-07-18 DIAGNOSIS — M150 Primary generalized (osteo)arthritis: Secondary | ICD-10-CM | POA: Diagnosis not present

## 2021-08-13 DIAGNOSIS — H353211 Exudative age-related macular degeneration, right eye, with active choroidal neovascularization: Secondary | ICD-10-CM | POA: Diagnosis not present

## 2021-08-13 DIAGNOSIS — H35363 Drusen (degenerative) of macula, bilateral: Secondary | ICD-10-CM | POA: Diagnosis not present

## 2021-08-25 DIAGNOSIS — Z23 Encounter for immunization: Secondary | ICD-10-CM | POA: Diagnosis not present

## 2021-09-28 DIAGNOSIS — M4726 Other spondylosis with radiculopathy, lumbar region: Secondary | ICD-10-CM | POA: Diagnosis not present

## 2021-09-28 DIAGNOSIS — I129 Hypertensive chronic kidney disease with stage 1 through stage 4 chronic kidney disease, or unspecified chronic kidney disease: Secondary | ICD-10-CM | POA: Diagnosis not present

## 2021-09-28 DIAGNOSIS — M47816 Spondylosis without myelopathy or radiculopathy, lumbar region: Secondary | ICD-10-CM | POA: Diagnosis not present

## 2021-09-28 DIAGNOSIS — E785 Hyperlipidemia, unspecified: Secondary | ICD-10-CM | POA: Diagnosis not present

## 2021-09-28 DIAGNOSIS — I10 Essential (primary) hypertension: Secondary | ICD-10-CM | POA: Diagnosis not present

## 2021-09-28 DIAGNOSIS — N183 Chronic kidney disease, stage 3 unspecified: Secondary | ICD-10-CM | POA: Diagnosis not present

## 2021-09-28 DIAGNOSIS — H409 Unspecified glaucoma: Secondary | ICD-10-CM | POA: Diagnosis not present

## 2021-09-28 DIAGNOSIS — M47812 Spondylosis without myelopathy or radiculopathy, cervical region: Secondary | ICD-10-CM | POA: Diagnosis not present

## 2021-09-28 DIAGNOSIS — M150 Primary generalized (osteo)arthritis: Secondary | ICD-10-CM | POA: Diagnosis not present

## 2021-10-01 DIAGNOSIS — H353211 Exudative age-related macular degeneration, right eye, with active choroidal neovascularization: Secondary | ICD-10-CM | POA: Diagnosis not present

## 2021-10-01 DIAGNOSIS — H4089 Other specified glaucoma: Secondary | ICD-10-CM | POA: Diagnosis not present

## 2021-11-03 ENCOUNTER — Ambulatory Visit: Payer: Medicare Other | Admitting: Cardiology

## 2021-11-07 ENCOUNTER — Other Ambulatory Visit: Payer: Self-pay | Admitting: Cardiology

## 2021-11-07 DIAGNOSIS — I493 Ventricular premature depolarization: Secondary | ICD-10-CM

## 2021-11-09 ENCOUNTER — Other Ambulatory Visit: Payer: Self-pay

## 2021-11-09 ENCOUNTER — Ambulatory Visit: Payer: Medicare Other | Admitting: Cardiology

## 2021-11-09 ENCOUNTER — Encounter: Payer: Self-pay | Admitting: Cardiology

## 2021-11-09 VITALS — BP 138/84 | HR 63 | Temp 97.8°F | Resp 16 | Ht 68.0 in | Wt 175.6 lb

## 2021-11-09 DIAGNOSIS — I493 Ventricular premature depolarization: Secondary | ICD-10-CM

## 2021-11-09 DIAGNOSIS — N183 Chronic kidney disease, stage 3 unspecified: Secondary | ICD-10-CM | POA: Diagnosis not present

## 2021-11-09 DIAGNOSIS — Z87891 Personal history of nicotine dependence: Secondary | ICD-10-CM | POA: Diagnosis not present

## 2021-11-09 DIAGNOSIS — I129 Hypertensive chronic kidney disease with stage 1 through stage 4 chronic kidney disease, or unspecified chronic kidney disease: Secondary | ICD-10-CM | POA: Diagnosis not present

## 2021-11-09 MED ORDER — METOPROLOL SUCCINATE ER 25 MG PO TB24: 25.0000 mg | ORAL_TABLET | Freq: Every morning | ORAL | 1 refills | Status: DC

## 2021-11-09 NOTE — Progress Notes (Signed)
Date: 11/09/21 Last Office Visit: 05/04/2021   Chief Complaint  Patient presents with   Follow-up    6 MONTH   PVC   Hypertension    HPI  Shane Consuegra Sr. is a 87 y.o. male who presents to the office with a chief complaint of " 87-month follow-up for PVC management." Patient's past medical history and cardiac risk factors include: hypertension with chronic kidney disease stage III, chronic venous insufficiency, PVCs, advanced age.  Patient is accompanied by his daughter Shane Knight at today's office visit.  He presents today for 25-month follow-up visit.  He denies any chest pain or shortness of breath at rest or with effort related activities.  His lower extremity swelling is well controlled after transitioning him off of amlodipine and to hydrochlorothiazide 12.5 mg p.o. daily.  And he also continues to wear his compression stockings.  Over the last 6 months the patient and his daughter have noted that he appears to be getting more tired and fatigued easily, is moving a bit slower but still independently walking with his cane, and " memory issues."  Patient denies any near-syncope or syncopal events.  Patient's blood pressure at today's office visit is not well controlled.  Probably due to him walking up the stairs.  At home when he is resting and relaxing the systolic blood pressures are around 140 mmHg.  They are encouraged to keep a log if possible.  No hospitalizations or urgent care visits since last office encounter.  ALLERGIES: Allergies  Allergen Reactions   Tape Rash   MEDICATION LIST PRIOR TO VISIT: Current Outpatient Medications on File Prior to Visit  Medication Sig Dispense Refill   acetaminophen (TYLENOL) 325 MG tablet Take 650 mg by mouth every 6 (six) hours as needed.     bismuth subsalicylate (PEPTO-BISMOL) 262 MG/15ML suspension as needed.     calcium carbonate (TUMS CHEWY BITES) 750 MG chewable tablet Chew 1 tablet by mouth as needed for heartburn.      hydrochlorothiazide (MICROZIDE) 12.5 MG capsule Take 1 capsule (12.5 mg total) by mouth in the morning. 90 capsule 1   latanoprost (XALATAN) 0.005 % ophthalmic solution INSTILL 1 DROP INTO BOTH EYES EVERY DAY AT NIGHT     Multiple Vitamins-Minerals (PRESERVISION AREDS 2+MULTI VIT PO) Take 2 capsules by mouth daily.     Polyethyl Glycol-Propyl Glycol (SYSTANE) 0.4-0.3 % SOLN See admin instructions.     polyethylene glycol (MIRALAX / GLYCOLAX) 17 g packet 1 packet mixed with 8 ounces of fluid     pregabalin (LYRICA) 150 MG capsule Take 150 mg by mouth 2 (two) times daily.     No current facility-administered medications on file prior to visit.    PAST MEDICAL HISTORY: Past Medical History:  Diagnosis Date   AAA (abdominal aortic aneurysm)    Chronic kidney disease    Hyperlipemia    Hypertension    PVC (premature ventricular contraction)     PAST SURGICAL HISTORY: Past Surgical History:  Procedure Laterality Date   APPENDECTOMY     EYE SURGERY     lens implants   HERNIA REPAIR     TENNIS ELBOW RELEASE/NIRSCHEL PROCEDURE     TONSILLECTOMY     TOTAL SHOULDER REPLACEMENT Bilateral     FAMILY HISTORY: The patient family history is not on file.   SOCIAL HISTORY:  The patient  reports that he quit smoking about 61 years ago. His smoking use included cigarettes. He has a 30.00 pack-year smoking history. He  has never used smokeless tobacco. He reports current alcohol use. He reports that he does not use drugs.  Review of Systems  Constitutional: Positive for malaise/fatigue. Negative for chills and fever.  HENT:  Negative for hoarse voice and nosebleeds.   Eyes:  Negative for discharge, double vision and pain.  Cardiovascular:  Negative for chest pain, claudication, dyspnea on exertion, leg swelling, near-syncope, orthopnea, palpitations, paroxysmal nocturnal dyspnea and syncope.  Respiratory:  Negative for hemoptysis and shortness of breath.   Musculoskeletal:  Negative for muscle  cramps and myalgias.  Gastrointestinal:  Negative for abdominal pain, constipation, diarrhea, hematemesis, hematochezia, melena, nausea and vomiting.  Neurological:  Negative for dizziness and light-headedness.   PHYSICAL EXAM: Vitals with BMI 11/09/2021 11/09/2021 11/09/2021  Height - - 5\' 8"   Weight - - 175 lbs 10 oz  BMI - - 19.14  Systolic 782 956 213  Diastolic 84 98 98  Pulse 63 57 71   CONSTITUTIONAL: Well-developed and well-nourished. No acute distress.  SKIN: Skin is warm and dry. No rash noted. No cyanosis. No pallor. No jaundice HEAD: Normocephalic and atraumatic.  EYES: No scleral icterus MOUTH/THROAT: Moist oral membranes.  NECK: No JVD present. No thyromegaly noted. No carotid bruits  LYMPHATIC: No visible cervical adenopathy.  CHEST Normal respiratory effort. No intercostal retractions  LUNGS: Clear to auscultation bilaterally.  No stridor. No wheezes. No rales.  CARDIOVASCULAR: Regular rate and rhythm, positive Y8-M5, holosystolic murmur heard at left sternal border, no gallops or rubs appreciated ABDOMINAL: No apparent ascites. No bruit  EXTREMITIES: Trace peripheral edema  HEMATOLOGIC: No significant bruising NEUROLOGIC: Oriented to person, place, and time. Nonfocal. Normal muscle tone.  PSYCHIATRIC: Normal mood and affect. Normal behavior. Cooperative  CARDIAC DATABASE: EKG: 03/27/2021: Normal sinus rhythm, 86 bpm, first-degree AV block, right bundle branch block, left axis deviation, left anterior fascicular block, frequent PVCs.  11/09/2021: Normal sinus rhythm, 90 bpm, RBBB, left axis, first-degree AV block, frequent PVCs, nonspecific T wave abnormality.   Echocardiogram: 04/07/2021:  Left ventricle cavity is normal in size. Moderate concentric hypertrophy of the left ventricle. Normal global wall motion. Normal LV systolic function with EF 60%. Doppler evidence of grade I (impaired) diastolic dysfunction, normal LAP.  Right ventricle cavity is mildly  dilated.  Normal right ventricular function.  Moderate tricuspid regurgitation. Peak RA-RV gradient 27 mmHg.  Mild pulmonic regurgitation.  IVC not seen.  No significant change compared to previous study in 2019.    Carotid artery duplex 07/27/2017: No hemodynamically significant arterial disease in the internal carotid artery bilaterally. Minimal soft plaque noted. Antegrade right vertebral artery flow. Antegrade left vertebral artery flow.  Ultrasound abdominal aorta:  12/18/2018: The maximum aorta diameter is 2.1 cm (mid). No evidence of atherosclerotic plaque. Normal flow velocities noted. No AAA noted.  LABORATORY DATA: CBC Latest Ref Rng & Units 01/27/2020 07/22/2019 03/30/2010  WBC 4.0 - 10.5 K/uL 7.6 10.0 8.1  Hemoglobin 13.0 - 17.0 g/dL 15.6 14.9 16.1  Hematocrit 39.0 - 52.0 % 47.7 45.6 46.5  Platelets 150 - 400 K/uL 181 166 196    CMP Latest Ref Rng & Units 04/23/2021 01/27/2020 07/22/2019  Glucose 65 - 99 mg/dL 76 97 112(H)  BUN 10 - 36 mg/dL 22 23 19   Creatinine 0.76 - 1.27 mg/dL 1.22 1.21 1.17  Sodium 134 - 144 mmol/L 146(H) 138 138  Potassium 3.5 - 5.2 mmol/L 4.8 4.2 3.7  Chloride 96 - 106 mmol/L 102 99 101  CO2 20 - 29 mmol/L 28 31 25  Calcium 8.6 - 10.2 mg/dL 10.2 10.1 9.6  Total Protein 6.5 - 8.1 g/dL - 7.0 -  Total Bilirubin 0.3 - 1.2 mg/dL - 0.8 -  Alkaline Phos 38 - 126 U/L - 85 -  AST 15 - 41 U/L - 23 -  ALT 0 - 44 U/L - 20 -    Lipid Panel  12/13/2018: Total cholesterol 173, HDL 53, LDL 93, triglycerides 137.  02/03/2021: Total cholesterol 155, triglycerides 88, HDL 55, LDL 83, non-HDL 100. Hemoglobin 14.8 g/dL, hematocrit 44.6% BUN 32, creatinine 1.22 g/dL, GFR 55. Sodium 144, potassium 4.1, chloride 106, bicarb 32, AST 16, ALT 16, alkaline phosphatase 79  FINAL MEDICATION LIST END OF ENCOUNTER: Meds ordered this encounter  Medications   metoprolol succinate (TOPROL XL) 25 MG 24 hr tablet    Sig: Take 1 tablet (25 mg total) by mouth every morning.     Dispense:  90 tablet    Refill:  1     Medications Discontinued During This Encounter  Medication Reason   Multiple Vitamins-Minerals (MULTIVITAMIN WITH MINERALS) tablet    metoprolol succinate (TOPROL XL) 25 MG 24 hr tablet Reorder     Current Outpatient Medications:    acetaminophen (TYLENOL) 325 MG tablet, Take 650 mg by mouth every 6 (six) hours as needed., Disp: , Rfl:    bismuth subsalicylate (PEPTO-BISMOL) 262 MG/15ML suspension, as needed., Disp: , Rfl:    calcium carbonate (TUMS CHEWY BITES) 750 MG chewable tablet, Chew 1 tablet by mouth as needed for heartburn., Disp: , Rfl:    hydrochlorothiazide (MICROZIDE) 12.5 MG capsule, Take 1 capsule (12.5 mg total) by mouth in the morning., Disp: 90 capsule, Rfl: 1   latanoprost (XALATAN) 0.005 % ophthalmic solution, INSTILL 1 DROP INTO BOTH EYES EVERY DAY AT NIGHT, Disp: , Rfl:    Multiple Vitamins-Minerals (PRESERVISION AREDS 2+MULTI VIT PO), Take 2 capsules by mouth daily., Disp: , Rfl:    Polyethyl Glycol-Propyl Glycol (SYSTANE) 0.4-0.3 % SOLN, See admin instructions., Disp: , Rfl:    polyethylene glycol (MIRALAX / GLYCOLAX) 17 g packet, 1 packet mixed with 8 ounces of fluid, Disp: , Rfl:    pregabalin (LYRICA) 150 MG capsule, Take 150 mg by mouth 2 (two) times daily., Disp: , Rfl:    metoprolol succinate (TOPROL XL) 25 MG 24 hr tablet, Take 1 tablet (25 mg total) by mouth every morning., Disp: 90 tablet, Rfl: 1  IMPRESSION:    ICD-10-CM   1. PVC (premature ventricular contraction)  I49.3 LONG TERM MONITOR (3-14 DAYS)    metoprolol succinate (TOPROL XL) 25 MG 24 hr tablet    2. Benign hypertension with CKD (chronic kidney disease) stage III (HCC)  I12.9 EKG 12-Lead   N18.30     3. Former smoker  Z87.891        RECOMMENDATIONS: Shane Angus Sr. is a 87 y.o. male whose past medical history and cardiac risk factors include: hypertension with chronic kidney disease stage III, chronic venous insufficiency, PVCs, advanced  age.  PVC (premature ventricular contraction) Possible symptomatic -feels tired, fatigued, walks slowly, trouble sleeping. EKG shows normal sinus rhythm with PVCs Increase Toprol-XL to 25 mg p.o. daily. Monitor for now. Had a discussion with the patient and daughter to uptitrate beta-blocker therapy to help improve his PVC burden.  However, if he continues to feel tired and fatigued despite up titration of beta-blocker therapy will be very reasonable to repeat a monitor prior to the next office visit to reevaluate his underlying PVC burden  and or the presence or absence of arrhythmia. Monitor scheduled for February 2023.   Benign hypertension with CKD (chronic kidney disease) stage III ([place]) Office blood pressures within acceptable range. Medications reconciled. Norvasc was discontinued secondary to lower extremity swelling.  Tolerating hydrochlorothiazide well.  Former smoker Patient educated on the importance of continued smoking cessation.  Orders Placed This Encounter  Procedures   LONG TERM MONITOR (3-14 DAYS)   EKG 12-Lead    --Continue cardiac medications as reconciled in final medication list. --Return in about 3 months (around 02/18/2022) for Follow up PVC. Or sooner if needed. --Continue follow-up with your primary care physician regarding the management of your other chronic comorbid conditions.  Patient's questions and concerns were addressed to his satisfaction. He voices understanding of the instructions provided during this encounter.   This note was created using a voice recognition software as a result there may be grammatical errors inadvertently enclosed that do not reflect the nature of this encounter. Every attempt is made to correct such errors.  Rex Kras, [place], [facility]  Pager: [phone] Office: [phone]

## 2021-11-17 DIAGNOSIS — J069 Acute upper respiratory infection, unspecified: Secondary | ICD-10-CM | POA: Diagnosis not present

## 2021-11-24 ENCOUNTER — Other Ambulatory Visit: Payer: Self-pay | Admitting: Cardiology

## 2021-11-24 DIAGNOSIS — N183 Chronic kidney disease, stage 3 unspecified: Secondary | ICD-10-CM

## 2021-12-10 DIAGNOSIS — Z961 Presence of intraocular lens: Secondary | ICD-10-CM | POA: Diagnosis not present

## 2021-12-10 DIAGNOSIS — H4051X1 Glaucoma secondary to other eye disorders, right eye, mild stage: Secondary | ICD-10-CM | POA: Diagnosis not present

## 2021-12-10 DIAGNOSIS — H353122 Nonexudative age-related macular degeneration, left eye, intermediate dry stage: Secondary | ICD-10-CM | POA: Diagnosis not present

## 2021-12-10 DIAGNOSIS — H353211 Exudative age-related macular degeneration, right eye, with active choroidal neovascularization: Secondary | ICD-10-CM | POA: Diagnosis not present

## 2021-12-24 ENCOUNTER — Other Ambulatory Visit: Payer: Self-pay

## 2021-12-24 ENCOUNTER — Telehealth: Payer: Self-pay | Admitting: Cardiology

## 2021-12-24 DIAGNOSIS — S0000XA Unspecified superficial injury of scalp, initial encounter: Secondary | ICD-10-CM | POA: Diagnosis not present

## 2021-12-24 DIAGNOSIS — L570 Actinic keratosis: Secondary | ICD-10-CM | POA: Diagnosis not present

## 2021-12-24 DIAGNOSIS — I493 Ventricular premature depolarization: Secondary | ICD-10-CM

## 2021-12-24 MED ORDER — METOPROLOL SUCCINATE ER 25 MG PO TB24: 25.0000 mg | ORAL_TABLET | Freq: Every morning | ORAL | 1 refills | Status: DC

## 2021-12-24 NOTE — Telephone Encounter (Signed)
Patient's daughter requesting refill for metoprolol, says Dr. Terri Skains is having him take 1 tablet in AM, so they need a prescription stating 1 tablet instead of 0.5. Patient's daughter says patient only has 2 pills left.

## 2022-02-08 ENCOUNTER — Encounter: Payer: Self-pay | Admitting: Cardiology

## 2022-02-08 ENCOUNTER — Ambulatory Visit: Payer: Medicare Other | Admitting: Cardiology

## 2022-02-08 ENCOUNTER — Other Ambulatory Visit: Payer: Self-pay

## 2022-02-08 VITALS — BP 157/78 | HR 71 | Temp 98.0°F | Resp 16 | Ht 68.0 in | Wt 179.0 lb

## 2022-02-08 DIAGNOSIS — I129 Hypertensive chronic kidney disease with stage 1 through stage 4 chronic kidney disease, or unspecified chronic kidney disease: Secondary | ICD-10-CM

## 2022-02-08 DIAGNOSIS — N183 Chronic kidney disease, stage 3 unspecified: Secondary | ICD-10-CM

## 2022-02-08 DIAGNOSIS — Z87891 Personal history of nicotine dependence: Secondary | ICD-10-CM

## 2022-02-08 DIAGNOSIS — I493 Ventricular premature depolarization: Secondary | ICD-10-CM

## 2022-02-08 DIAGNOSIS — R0602 Shortness of breath: Secondary | ICD-10-CM

## 2022-02-08 MED ORDER — DILTIAZEM HCL ER COATED BEADS 120 MG PO CP24: 120.0000 mg | ORAL_CAPSULE | Freq: Every day | ORAL | 0 refills | Status: DC

## 2022-02-08 NOTE — Progress Notes (Signed)
Date: 02/08/22 Last Office Visit: 11/09/2021   Chief Complaint  Patient presents with   Follow-up    3 month PVCs    HPI  Shane Kadlec Sr. is a 88 y.o. male whose past medical history and cardiac risk factors include: hypertension with chronic kidney disease stage III, chronic venous insufficiency, PVCs, advanced age.  Patient is accompanied by his daughter Shane Knight at today's office visit.  He provides verbal consent for having her present during today's encounter and she also provides collateral history as part of today's visit.  Patient presents today for 25-monthfollow-up visit for management of premature ventricular contractions and lower extremity swelling.  During prior office visits patient has been uptitrated on beta-blocker therapy which has helped his ventricular rate and also has made it feel more energetic, less tired and fatigue and has noticed an improvement in endurance.  With regards to lower extremity swelling he was recommended to discontinue amlodipine and also start hydrochlorothiazide.  Swelling has improved but still present.  Likely due to dietary indiscretion as he lives independently and often ends up eating canned foods such as canned soups, pinto beans, etc.  He denies any heart failure angina pectoris.  No hospitalizations last office encounter.  ALLERGIES: Allergies  Allergen Reactions   Tape Rash   MEDICATION LIST PRIOR TO VISIT: Current Outpatient Medications on File Prior to Visit  Medication Sig Dispense Refill   acetaminophen (TYLENOL) 325 MG tablet Take 650 mg by mouth every 6 (six) hours as needed.     bismuth subsalicylate (PEPTO-BISMOL) 262 MG/15ML suspension as needed.     calcium carbonate (TUMS CHEWY BITES) 750 MG chewable tablet Chew 1 tablet by mouth as needed for heartburn.     dextromethorphan (DELSYM) 30 MG/5ML liquid Take by mouth.     hydrochlorothiazide (MICROZIDE) 12.5 MG capsule TAKE 1 CAPSULE (12.5 MG TOTAL) BY MOUTH IN  THE MORNING. 90 capsule 1   latanoprost (XALATAN) 0.005 % ophthalmic solution INSTILL 1 DROP INTO BOTH EYES EVERY DAY AT NIGHT     Multiple Vitamins-Minerals (PRESERVISION AREDS 2+MULTI VIT PO) Take 2 capsules by mouth daily.     Polyethyl Glycol-Propyl Glycol (SYSTANE) 0.4-0.3 % SOLN See admin instructions.     polyethylene glycol (MIRALAX / GLYCOLAX) 17 g packet 1 packet mixed with 8 ounces of fluid     pregabalin (LYRICA) 150 MG capsule Take 150 mg by mouth 2 (two) times daily.     No current facility-administered medications on file prior to visit.    PAST MEDICAL HISTORY: Past Medical History:  Diagnosis Date   AAA (abdominal aortic aneurysm)    Chronic kidney disease    Hyperlipemia    Hypertension    PVC (premature ventricular contraction)     PAST SURGICAL HISTORY: Past Surgical History:  Procedure Laterality Date   APPENDECTOMY     EYE SURGERY     lens implants   HERNIA REPAIR     TENNIS ELBOW RELEASE/NIRSCHEL PROCEDURE     TONSILLECTOMY     TOTAL SHOULDER REPLACEMENT Bilateral     FAMILY HISTORY: The patient family history is not on file.   SOCIAL HISTORY:  The patient  reports that he quit smoking about 62 years ago. His smoking use included cigarettes. He has a 30.00 pack-year smoking history. He has never used smokeless tobacco. He reports current alcohol use. He reports that he does not use drugs.  Review of Systems  Constitutional: Negative for chills, fever and malaise/fatigue (improved).  HENT:  Negative for hoarse voice and nosebleeds.   Eyes:  Negative for discharge, double vision and pain.  Cardiovascular:  Negative for chest pain, claudication, dyspnea on exertion, leg swelling, near-syncope, orthopnea, palpitations, paroxysmal nocturnal dyspnea and syncope.  Respiratory:  Negative for hemoptysis and shortness of breath.   Musculoskeletal:  Negative for muscle cramps and myalgias.  Gastrointestinal:  Negative for abdominal pain, constipation,  diarrhea, hematemesis, hematochezia, melena, nausea and vomiting.  Neurological:  Negative for dizziness and light-headedness.   PHYSICAL EXAM: Vitals with BMI 02/08/2022 11/09/2021 11/09/2021  Height '5\' 8"'$  - -  Weight 179 lbs - -  BMI 31.51 - -  Systolic 761 607 371  Diastolic 78 84 98  Pulse 71 63 57   CONSTITUTIONAL: Well-developed and well-nourished. No acute distress.  SKIN: Skin is warm and dry. No rash noted. No cyanosis. No pallor. No jaundice HEAD: Normocephalic and atraumatic.  EYES: No scleral icterus MOUTH/THROAT: Moist oral membranes.  NECK: No JVD present. No thyromegaly noted. No carotid bruits  LYMPHATIC: No visible cervical adenopathy.  CHEST Normal respiratory effort. No intercostal retractions  LUNGS: Clear to auscultation bilaterally.  No stridor. No wheezes. No rales.  CARDIOVASCULAR: Regular rate and rhythm, positive G6-Y6, holosystolic murmur heard at left sternal border, no gallops or rubs appreciated ABDOMINAL: Soft, nontender, nondistended, positive bowel sounds in all 4 quadrants, no apparent ascites. No bruit  EXTREMITIES: Bilateral +1 peripheral edema, compression stockings present, warm to touch HEMATOLOGIC: No significant bruising NEUROLOGIC: Oriented to person, place, and time. Nonfocal. Normal muscle tone.  PSYCHIATRIC: Normal mood and affect. Normal behavior. Cooperative  CARDIAC DATABASE: EKG: 03/27/2021: Normal sinus rhythm, 86 bpm, first-degree AV block, right bundle branch block, left axis deviation, left anterior fascicular block, frequent PVCs.  11/09/2021: Normal sinus rhythm, 90 bpm, RBBB, left axis, first-degree AV block, frequent PVCs, nonspecific T wave abnormality.   Echocardiogram: 04/07/2021:  Left ventricle cavity is normal in size. Moderate concentric hypertrophy of the left ventricle. Normal global wall motion. Normal LV systolic function with EF 60%. Doppler evidence of grade I (impaired) diastolic dysfunction, normal LAP.  Right  ventricle cavity is mildly dilated.  Normal right ventricular function.  Moderate tricuspid regurgitation. Peak RA-RV gradient 27 mmHg.  Mild pulmonic regurgitation.  IVC not seen.  No significant change compared to previous study in 2019.    Carotid artery duplex 07/27/2017: No hemodynamically significant arterial disease in the internal carotid artery bilaterally. Minimal soft plaque noted. Antegrade right vertebral artery flow. Antegrade left vertebral artery flow.  Ultrasound abdominal aorta:  12/18/2018: The maximum aorta diameter is 2.1 cm (mid). No evidence of atherosclerotic plaque. Normal flow velocities noted. No AAA noted.  LABORATORY DATA: CBC Latest Ref Rng & Units 01/27/2020 07/22/2019 03/30/2010  WBC 4.0 - 10.5 K/uL 7.6 10.0 8.1  Hemoglobin 13.0 - 17.0 g/dL 15.6 14.9 16.1  Hematocrit 39.0 - 52.0 % 47.7 45.6 46.5  Platelets 150 - 400 K/uL 181 166 196    CMP Latest Ref Rng & Units 04/23/2021 01/27/2020 07/22/2019  Glucose 65 - 99 mg/dL 76 97 112(H)  BUN 10 - 36 mg/dL '22 23 19  '$ Creatinine 0.76 - 1.27 mg/dL 1.22 1.21 1.17  Sodium 134 - 144 mmol/L 146(H) 138 138  Potassium 3.5 - 5.2 mmol/L 4.8 4.2 3.7  Chloride 96 - 106 mmol/L 102 99 101  CO2 20 - 29 mmol/L '28 31 25  '$ Calcium 8.6 - 10.2 mg/dL 10.2 10.1 9.6  Total Protein 6.5 - 8.1 g/dL - 7.0 -  Total Bilirubin 0.3 - 1.2 mg/dL - 0.8 -  Alkaline Phos 38 - 126 U/L - 85 -  AST 15 - 41 U/L - 23 -  ALT 0 - 44 U/L - 20 -    Lipid Panel  12/13/2018: Total cholesterol 173, HDL 53, LDL 93, triglycerides 137.  02/03/2021: Total cholesterol 155, triglycerides 88, HDL 55, LDL 83, non-HDL 100. Hemoglobin 14.8 g/dL, hematocrit 44.6% BUN 32, creatinine 1.22 g/dL, GFR 55. Sodium 144, potassium 4.1, chloride 106, bicarb 32, AST 16, ALT 16, alkaline phosphatase 79  FINAL MEDICATION LIST END OF ENCOUNTER: Meds ordered this encounter  Medications   diltiazem (CARDIZEM CD) 120 MG 24 hr capsule    Sig: Take 1 capsule (120 mg total) by  mouth daily.    Dispense:  90 capsule    Refill:  0     Medications Discontinued During This Encounter  Medication Reason   metoprolol succinate (TOPROL XL) 25 MG 24 hr tablet Change in therapy     Current Outpatient Medications:    acetaminophen (TYLENOL) 325 MG tablet, Take 650 mg by mouth every 6 (six) hours as needed., Disp: , Rfl:    bismuth subsalicylate (PEPTO-BISMOL) 262 MG/15ML suspension, as needed., Disp: , Rfl:    calcium carbonate (TUMS CHEWY BITES) 750 MG chewable tablet, Chew 1 tablet by mouth as needed for heartburn., Disp: , Rfl:    dextromethorphan (DELSYM) 30 MG/5ML liquid, Take by mouth., Disp: , Rfl:    diltiazem (CARDIZEM CD) 120 MG 24 hr capsule, Take 1 capsule (120 mg total) by mouth daily., Disp: 90 capsule, Rfl: 0   hydrochlorothiazide (MICROZIDE) 12.5 MG capsule, TAKE 1 CAPSULE (12.5 MG TOTAL) BY MOUTH IN THE MORNING., Disp: 90 capsule, Rfl: 1   latanoprost (XALATAN) 0.005 % ophthalmic solution, INSTILL 1 DROP INTO BOTH EYES EVERY DAY AT NIGHT, Disp: , Rfl:    Multiple Vitamins-Minerals (PRESERVISION AREDS 2+MULTI VIT PO), Take 2 capsules by mouth daily., Disp: , Rfl:    Polyethyl Glycol-Propyl Glycol (SYSTANE) 0.4-0.3 % SOLN, See admin instructions., Disp: , Rfl:    polyethylene glycol (MIRALAX / GLYCOLAX) 17 g packet, 1 packet mixed with 8 ounces of fluid, Disp: , Rfl:    pregabalin (LYRICA) 150 MG capsule, Take 150 mg by mouth 2 (two) times daily., Disp: , Rfl:   IMPRESSION:    ICD-10-CM   1. PVC (premature ventricular contraction)  I49.3 diltiazem (CARDIZEM CD) 120 MG 24 hr capsule    2. Benign hypertension with CKD (chronic kidney disease) stage III (HCC)  I12.9    N18.30     3. Former smoker  Z87.891     4. Shortness of breath  R06.02        RECOMMENDATIONS: Shane Vandam Sr. is a 88 y.o. male whose past medical history and cardiac risk factors include: hypertension with chronic kidney disease stage III, chronic venous insufficiency, PVCs,  advanced age.  PVC (premature ventricular contraction) Symptomatic PVCs -improving We will transition him from Toprol-XL to Cardizem Monitor for now.  Benign hypertension with CKD (chronic kidney disease) stage III ([place]) Office blood pressures are not well controlled; however, home blood pressures are within acceptable range. Lower extremity swelling has improved after discontinuation of Norvasc Tolerating hydrochlorothiazide well. We discussed transitioning his antihypertensive medications to a stronger diuretic; however, the shared decision was to reevaluate this at the next visit and focus on reducing salt intake daily basis.  Currently his diet is high in salt as he lives independently and does  consume canned Campbell soup, pinto beans, etc.  Former smoker Educated on the importance of continued smoking cessation.  No orders of the defined types were placed in this encounter.   --Continue cardiac medications as reconciled in final medication list. --Return in about 3 months (around 05/11/2022) for Follow up PVCs and LE swelling. Or sooner if needed. --Continue follow-up with your primary care physician regarding the management of your other chronic comorbid conditions.  Patient's questions and concerns were addressed to his satisfaction. He voices understanding of the instructions provided during this encounter.   This note was created using a voice recognition software as a result there may be grammatical errors inadvertently enclosed that do not reflect the nature of this encounter. Every attempt is made to correct such errors.  Rex Kras, [place], [facility]  Pager: [phone] Office: [phone]

## 2022-02-24 DIAGNOSIS — M79674 Pain in right toe(s): Secondary | ICD-10-CM | POA: Diagnosis not present

## 2022-02-24 DIAGNOSIS — M79675 Pain in left toe(s): Secondary | ICD-10-CM | POA: Diagnosis not present

## 2022-02-24 DIAGNOSIS — B351 Tinea unguium: Secondary | ICD-10-CM | POA: Diagnosis not present

## 2022-03-04 DIAGNOSIS — H4051X1 Glaucoma secondary to other eye disorders, right eye, mild stage: Secondary | ICD-10-CM | POA: Diagnosis not present

## 2022-03-04 DIAGNOSIS — I10 Essential (primary) hypertension: Secondary | ICD-10-CM | POA: Diagnosis not present

## 2022-03-04 DIAGNOSIS — H353122 Nonexudative age-related macular degeneration, left eye, intermediate dry stage: Secondary | ICD-10-CM | POA: Diagnosis not present

## 2022-03-04 DIAGNOSIS — Z85828 Personal history of other malignant neoplasm of skin: Secondary | ICD-10-CM | POA: Diagnosis not present

## 2022-03-04 DIAGNOSIS — Z79899 Other long term (current) drug therapy: Secondary | ICD-10-CM | POA: Diagnosis not present

## 2022-03-04 DIAGNOSIS — Z83518 Family history of other specified eye disorder: Secondary | ICD-10-CM | POA: Diagnosis not present

## 2022-03-04 DIAGNOSIS — R7303 Prediabetes: Secondary | ICD-10-CM | POA: Diagnosis not present

## 2022-03-04 DIAGNOSIS — H353211 Exudative age-related macular degeneration, right eye, with active choroidal neovascularization: Secondary | ICD-10-CM | POA: Diagnosis not present

## 2022-03-10 DIAGNOSIS — E782 Mixed hyperlipidemia: Secondary | ICD-10-CM | POA: Diagnosis not present

## 2022-03-10 DIAGNOSIS — G629 Polyneuropathy, unspecified: Secondary | ICD-10-CM | POA: Diagnosis not present

## 2022-03-10 DIAGNOSIS — I493 Ventricular premature depolarization: Secondary | ICD-10-CM | POA: Diagnosis not present

## 2022-03-10 DIAGNOSIS — H3530 Unspecified macular degeneration: Secondary | ICD-10-CM | POA: Diagnosis not present

## 2022-03-10 DIAGNOSIS — Z0000 Encounter for general adult medical examination without abnormal findings: Secondary | ICD-10-CM | POA: Diagnosis not present

## 2022-03-10 DIAGNOSIS — H409 Unspecified glaucoma: Secondary | ICD-10-CM | POA: Diagnosis not present

## 2022-03-10 DIAGNOSIS — I10 Essential (primary) hypertension: Secondary | ICD-10-CM | POA: Diagnosis not present

## 2022-03-10 DIAGNOSIS — Z1211 Encounter for screening for malignant neoplasm of colon: Secondary | ICD-10-CM | POA: Diagnosis not present

## 2022-03-10 DIAGNOSIS — N2889 Other specified disorders of kidney and ureter: Secondary | ICD-10-CM | POA: Diagnosis not present

## 2022-03-19 ENCOUNTER — Other Ambulatory Visit: Payer: Self-pay | Admitting: Family Medicine

## 2022-03-19 DIAGNOSIS — N2889 Other specified disorders of kidney and ureter: Secondary | ICD-10-CM

## 2022-04-13 ENCOUNTER — Ambulatory Visit (HOSPITAL_BASED_OUTPATIENT_CLINIC_OR_DEPARTMENT_OTHER): Payer: Medicare Other

## 2022-04-20 DIAGNOSIS — R410 Disorientation, unspecified: Secondary | ICD-10-CM | POA: Diagnosis not present

## 2022-04-20 DIAGNOSIS — R5383 Other fatigue: Secondary | ICD-10-CM | POA: Diagnosis not present

## 2022-04-29 ENCOUNTER — Other Ambulatory Visit: Payer: Self-pay | Admitting: Cardiology

## 2022-04-29 DIAGNOSIS — I493 Ventricular premature depolarization: Secondary | ICD-10-CM

## 2022-05-18 ENCOUNTER — Other Ambulatory Visit (HOSPITAL_COMMUNITY): Payer: Self-pay | Admitting: Family Medicine

## 2022-05-18 DIAGNOSIS — N2889 Other specified disorders of kidney and ureter: Secondary | ICD-10-CM

## 2022-05-21 ENCOUNTER — Other Ambulatory Visit: Payer: Self-pay | Admitting: Family Medicine

## 2022-05-21 DIAGNOSIS — N2889 Other specified disorders of kidney and ureter: Secondary | ICD-10-CM

## 2022-05-22 ENCOUNTER — Ambulatory Visit (HOSPITAL_BASED_OUTPATIENT_CLINIC_OR_DEPARTMENT_OTHER): Admission: RE | Admit: 2022-05-22 | Payer: Medicare Other | Source: Ambulatory Visit

## 2022-05-27 ENCOUNTER — Ambulatory Visit: Payer: Medicare Other | Admitting: Cardiology

## 2022-05-27 ENCOUNTER — Encounter: Payer: Self-pay | Admitting: Cardiology

## 2022-05-27 VITALS — BP 134/82 | HR 70 | Temp 98.7°F | Resp 16 | Ht 68.0 in | Wt 175.0 lb

## 2022-05-27 DIAGNOSIS — Z1211 Encounter for screening for malignant neoplasm of colon: Secondary | ICD-10-CM | POA: Diagnosis not present

## 2022-05-27 DIAGNOSIS — I129 Hypertensive chronic kidney disease with stage 1 through stage 4 chronic kidney disease, or unspecified chronic kidney disease: Secondary | ICD-10-CM

## 2022-05-27 DIAGNOSIS — Z87891 Personal history of nicotine dependence: Secondary | ICD-10-CM | POA: Diagnosis not present

## 2022-05-27 DIAGNOSIS — I493 Ventricular premature depolarization: Secondary | ICD-10-CM

## 2022-05-27 DIAGNOSIS — N183 Chronic kidney disease, stage 3 unspecified: Secondary | ICD-10-CM | POA: Diagnosis not present

## 2022-05-27 NOTE — Progress Notes (Signed)
Date: 05/27/22 Last Office Visit: 02/08/2022   Chief Complaint  Patient presents with   premature ventricular contraction   Follow-up    HPI  Shane Shugars Sr. is a 88 y.o. male whose past medical history and cardiac risk factors include: hypertension with chronic kidney disease stage III, chronic venous insufficiency, PVCs, advanced age.  Patient is accompanied by his daughter Shane Knight at today's office visit.  He provides verbal consent for having her present during today's encounter and she also provides collateral history as part of today's visit.  Patient follows up for evaluation of PVC management.  In the past patient was on beta-blocker therapy which did help his ventricular rate but he felt tired/fatigued and at times depressed.  At last office visit we discontinued his amlodipine and started him on hydrochlorothiazide and beta-blockers were transitioned to diltiazem.  He is now here for follow-up.  Over the last 3 months he is doing well from a cardiovascular standpoint.  He is asymptomatic, feels energetic, able to do his activities of daily living and continues to live independently.  They have significantly reduced canned foods including canned soups and his lower extremity swelling has improved when compared to before.  ALLERGIES: Allergies  Allergen Reactions   Tape Rash   MEDICATION LIST PRIOR TO VISIT: Current Outpatient Medications on File Prior to Visit  Medication Sig Dispense Refill   acetaminophen (TYLENOL) 325 MG tablet Take 650 mg by mouth every 6 (six) hours as needed.     calcium carbonate (TUMS CHEWY BITES) 750 MG chewable tablet Chew 1 tablet by mouth as needed for heartburn.     dextromethorphan (DELSYM) 30 MG/5ML liquid Take by mouth at bedtime as needed.     diltiazem (CARDIZEM CD) 120 MG 24 hr capsule TAKE 1 CAPSULE BY MOUTH EVERY DAY 90 capsule 0   hydrochlorothiazide (MICROZIDE) 12.5 MG capsule TAKE 1 CAPSULE (12.5 MG TOTAL) BY MOUTH IN THE  MORNING. 90 capsule 1   latanoprost (XALATAN) 0.005 % ophthalmic solution INSTILL 1 DROP INTO BOTH EYES EVERY DAY AT NIGHT     Multiple Vitamins-Minerals (PRESERVISION AREDS 2+MULTI VIT PO) Take 2 capsules by mouth daily.     Polyethyl Glycol-Propyl Glycol (SYSTANE) 0.4-0.3 % SOLN See admin instructions.     polyethylene glycol (MIRALAX / GLYCOLAX) 17 g packet 1 packet mixed with 8 ounces of fluid     pregabalin (LYRICA) 150 MG capsule Take 150 mg by mouth 2 (two) times daily.     No current facility-administered medications on file prior to visit.    PAST MEDICAL HISTORY: Past Medical History:  Diagnosis Date   AAA (abdominal aortic aneurysm) (HCC)    Chronic kidney disease    Glaucoma    Hyperlipemia    Hypertension    PVC (premature ventricular contraction)     PAST SURGICAL HISTORY: Past Surgical History:  Procedure Laterality Date   APPENDECTOMY     EYE SURGERY     lens implants   HERNIA REPAIR     TENNIS ELBOW RELEASE/NIRSCHEL PROCEDURE     TONSILLECTOMY     TOTAL SHOULDER REPLACEMENT Bilateral     FAMILY HISTORY: The patient family history is not on file.   SOCIAL HISTORY:  The patient  reports that he quit smoking about 62 years ago. His smoking use included cigarettes. He has a 30.00 pack-year smoking history. He has never used smokeless tobacco. He reports current alcohol use. He reports that he does not use drugs.  Review  of Systems  Constitutional: Negative for chills, fever and malaise/fatigue (improved).  HENT:  Negative for hoarse voice and nosebleeds.   Eyes:  Negative for discharge, double vision and pain.  Cardiovascular:  Negative for chest pain, claudication, dyspnea on exertion, leg swelling, near-syncope, orthopnea, palpitations, paroxysmal nocturnal dyspnea and syncope.  Respiratory:  Negative for hemoptysis and shortness of breath.   Musculoskeletal:  Negative for muscle cramps and myalgias.  Gastrointestinal:  Negative for abdominal pain,  constipation, diarrhea, hematemesis, hematochezia, melena, nausea and vomiting.  Neurological:  Negative for dizziness and light-headedness.    PHYSICAL EXAM:    05/27/2022    1:44 PM 05/27/2022    1:39 PM 02/08/2022    1:34 PM  Vitals with BMI  Height  '5\' 8"'$  '5\' 8"'$   Weight  175 lbs 179 lbs  BMI  45.40 98.11  Systolic 914 782 956  Diastolic 82 79 78  Pulse 70 80 71   CONSTITUTIONAL: Age-appropriate male, hemodynamically stable, no acute distress, ambulates with a cane. SKIN: Skin is warm and dry. No rash noted. No cyanosis. No pallor. No jaundice HEAD: Normocephalic and atraumatic.  EYES: No scleral icterus MOUTH/THROAT: Moist oral membranes.  NECK: No JVD present. No thyromegaly noted. No carotid bruits  CHEST Normal respiratory effort. No intercostal retractions  LUNGS: Clear to auscultation bilaterally.  No stridor. No wheezes. No rales.  CARDIOVASCULAR: Regular rate and rhythm, positive O1-H0, holosystolic murmur heard at left sternal border, no gallops or rubs appreciated ABDOMINAL: Soft, nontender, nondistended, positive bowel sounds in all 4 quadrants, no apparent ascites. No bruit  EXTREMITIES: Bilateral trace peripheral edema, compression stockings present, warm to touch HEMATOLOGIC: No significant bruising NEUROLOGIC: Oriented to person, place, and time. Nonfocal. Normal muscle tone.  PSYCHIATRIC: Normal mood and affect. Normal behavior. Cooperative  CARDIAC DATABASE: EKG: 03/27/2021: Normal sinus rhythm, 86 bpm, first-degree AV block, right bundle branch block, left axis deviation, left anterior fascicular block, frequent PVCs.  05/27/2022: Normal sinus rhythm, 71 bpm, left axis, first-degree AV block, right bundle branch block, ST-T changes likely secondary RBBB.   Echocardiogram: 04/07/2021:  Left ventricle cavity is normal in size. Moderate concentric hypertrophy of the left ventricle. Normal global wall motion. Normal LV systolic function with EF 60%. Doppler evidence  of grade I (impaired) diastolic dysfunction, normal LAP.  Right ventricle cavity is mildly dilated.  Normal right ventricular function.  Moderate tricuspid regurgitation. Peak RA-RV gradient 27 mmHg.  Mild pulmonic regurgitation.  IVC not seen.  No significant change compared to previous study in 2019.    Carotid artery duplex 07/27/2017: No hemodynamically significant arterial disease in the internal carotid artery bilaterally. Minimal soft plaque noted. Antegrade right vertebral artery flow. Antegrade left vertebral artery flow.  Ultrasound abdominal aorta:  12/18/2018: The maximum aorta diameter is 2.1 cm (mid). No evidence of atherosclerotic plaque. Normal flow velocities noted. No AAA noted.  LABORATORY DATA:    Latest Ref Rng & Units 01/27/2020   10:40 AM 07/22/2019    6:01 AM 03/30/2010    8:52 AM  CBC  WBC 4.0 - 10.5 K/uL 7.6  10.0  8.1   Hemoglobin 13.0 - 17.0 g/dL 15.6  14.9  16.1   Hematocrit 39.0 - 52.0 % 47.7  45.6  46.5   Platelets 150 - 400 K/uL 181  166  196        Latest Ref Rng & Units 04/23/2021    1:32 PM 01/27/2020   10:40 AM 07/22/2019    6:01 AM  CMP  Glucose 65 - 99 mg/dL 76  97  112   BUN 10 - 36 mg/dL '22  23  19   '$ Creatinine 0.76 - 1.27 mg/dL 1.22  1.21  1.17   Sodium 134 - 144 mmol/L 146  138  138   Potassium 3.5 - 5.2 mmol/L 4.8  4.2  3.7   Chloride 96 - 106 mmol/L 102  99  101   CO2 20 - 29 mmol/L '28  31  25   '$ Calcium 8.6 - 10.2 mg/dL 10.2  10.1  9.6   Total Protein 6.5 - 8.1 g/dL  7.0    Total Bilirubin 0.3 - 1.2 mg/dL  0.8    Alkaline Phos 38 - 126 U/L  85    AST 15 - 41 U/L  23    ALT 0 - 44 U/L  20      Lipid Panel  12/13/2018: Total cholesterol 173, HDL 53, LDL 93, triglycerides 137.  02/03/2021: Total cholesterol 155, triglycerides 88, HDL 55, LDL 83, non-HDL 100. Hemoglobin 14.8 g/dL, hematocrit 44.6% BUN 32, creatinine 1.22 g/dL, GFR 55. Sodium 144, potassium 4.1, chloride 106, bicarb 32, AST 16, ALT 16, alkaline phosphatase  79  FINAL MEDICATION LIST END OF ENCOUNTER: No orders of the defined types were placed in this encounter.    Medications Discontinued During This Encounter  Medication Reason   bismuth subsalicylate (PEPTO-BISMOL) 262 MG/15ML suspension      Current Outpatient Medications:    acetaminophen (TYLENOL) 325 MG tablet, Take 650 mg by mouth every 6 (six) hours as needed., Disp: , Rfl:    calcium carbonate (TUMS CHEWY BITES) 750 MG chewable tablet, Chew 1 tablet by mouth as needed for heartburn., Disp: , Rfl:    dextromethorphan (DELSYM) 30 MG/5ML liquid, Take by mouth at bedtime as needed., Disp: , Rfl:    diltiazem (CARDIZEM CD) 120 MG 24 hr capsule, TAKE 1 CAPSULE BY MOUTH EVERY DAY, Disp: 90 capsule, Rfl: 0   hydrochlorothiazide (MICROZIDE) 12.5 MG capsule, TAKE 1 CAPSULE (12.5 MG TOTAL) BY MOUTH IN THE MORNING., Disp: 90 capsule, Rfl: 1   latanoprost (XALATAN) 0.005 % ophthalmic solution, INSTILL 1 DROP INTO BOTH EYES EVERY DAY AT NIGHT, Disp: , Rfl:    Multiple Vitamins-Minerals (PRESERVISION AREDS 2+MULTI VIT PO), Take 2 capsules by mouth daily., Disp: , Rfl:    Polyethyl Glycol-Propyl Glycol (SYSTANE) 0.4-0.3 % SOLN, See admin instructions., Disp: , Rfl:    polyethylene glycol (MIRALAX / GLYCOLAX) 17 g packet, 1 packet mixed with 8 ounces of fluid, Disp: , Rfl:    pregabalin (LYRICA) 150 MG capsule, Take 150 mg by mouth 2 (two) times daily., Disp: , Rfl:   IMPRESSION:    ICD-10-CM   1. PVC (premature ventricular contraction)  I49.3 EKG 12-Lead    2. Benign hypertension with CKD (chronic kidney disease) stage III (HCC)  I12.9    N18.30     3. Former smoker  Z87.891        RECOMMENDATIONS: Shane Weier Sr. is a 88 y.o. male whose past medical history and cardiac risk factors include: hypertension with chronic kidney disease stage III, chronic venous insufficiency, PVCs, advanced age.  PVC (premature ventricular contraction) Better controlled. Transition from Toprol-XL to  Cardizem. EKG today shows sinus rhythm without underlying dysrhythmias/PVCs. Monitor for now  Benign hypertension with CKD (chronic kidney disease) stage III ([place]) Office and Home blood pressures are very well controlled - log uploaded it EMR. Medications reconciled. No changes warranted at this  time.  Orders Placed This Encounter  Procedures   EKG 12-Lead   --Continue cardiac medications as reconciled in final medication list. --Return in about 6 months (around 11/26/2022) for Follow up PVCs. Or sooner if needed. --Continue follow-up with your primary care physician regarding the management of your other chronic comorbid conditions.  Patient's questions and concerns were addressed to his satisfaction. He voices understanding of the instructions provided during this encounter.   This note was created using a voice recognition software as a result there may be grammatical errors inadvertently enclosed that do not reflect the nature of this encounter. Every attempt is made to correct such errors.  Rex Kras, [place], [facility]  Pager: [phone] Office: [phone]

## 2022-05-29 ENCOUNTER — Other Ambulatory Visit: Payer: Self-pay | Admitting: Cardiology

## 2022-05-29 DIAGNOSIS — I129 Hypertensive chronic kidney disease with stage 1 through stage 4 chronic kidney disease, or unspecified chronic kidney disease: Secondary | ICD-10-CM

## 2022-06-03 DIAGNOSIS — H35371 Puckering of macula, right eye: Secondary | ICD-10-CM | POA: Diagnosis not present

## 2022-06-03 DIAGNOSIS — H353122 Nonexudative age-related macular degeneration, left eye, intermediate dry stage: Secondary | ICD-10-CM | POA: Diagnosis not present

## 2022-06-03 DIAGNOSIS — H35363 Drusen (degenerative) of macula, bilateral: Secondary | ICD-10-CM | POA: Diagnosis not present

## 2022-06-03 DIAGNOSIS — H353211 Exudative age-related macular degeneration, right eye, with active choroidal neovascularization: Secondary | ICD-10-CM | POA: Diagnosis not present

## 2022-06-07 DIAGNOSIS — M549 Dorsalgia, unspecified: Secondary | ICD-10-CM | POA: Diagnosis not present

## 2022-06-07 DIAGNOSIS — Z01812 Encounter for preprocedural laboratory examination: Secondary | ICD-10-CM | POA: Diagnosis not present

## 2022-06-09 ENCOUNTER — Ambulatory Visit (HOSPITAL_BASED_OUTPATIENT_CLINIC_OR_DEPARTMENT_OTHER): Admission: RE | Admit: 2022-06-09 | Payer: Medicare Other | Source: Ambulatory Visit

## 2022-06-12 ENCOUNTER — Ambulatory Visit (HOSPITAL_BASED_OUTPATIENT_CLINIC_OR_DEPARTMENT_OTHER)
Admission: RE | Admit: 2022-06-12 | Discharge: 2022-06-12 | Disposition: A | Discharge status: Home / Self Care | Payer: Medicare Other | Source: Ambulatory Visit | Attending: Family Medicine | Admitting: Family Medicine

## 2022-06-12 ENCOUNTER — Encounter (HOSPITAL_BASED_OUTPATIENT_CLINIC_OR_DEPARTMENT_OTHER): Payer: Self-pay

## 2022-06-12 DIAGNOSIS — N2889 Other specified disorders of kidney and ureter: Secondary | ICD-10-CM | POA: Insufficient documentation

## 2022-06-12 DIAGNOSIS — K7689 Other specified diseases of liver: Secondary | ICD-10-CM | POA: Diagnosis not present

## 2022-06-12 DIAGNOSIS — N281 Cyst of kidney, acquired: Secondary | ICD-10-CM | POA: Diagnosis not present

## 2022-06-12 DIAGNOSIS — K5730 Diverticulosis of large intestine without perforation or abscess without bleeding: Secondary | ICD-10-CM | POA: Diagnosis not present

## 2022-06-12 MED ORDER — IOHEXOL 300 MG/ML  SOLN
100.0000 mL | Freq: Once | INTRAMUSCULAR | Status: AC | PRN
Start: 2022-06-12 — End: 2022-06-12
  Administered 2022-06-12: 100 mL via INTRAVENOUS

## 2022-06-17 DIAGNOSIS — L814 Other melanin hyperpigmentation: Secondary | ICD-10-CM | POA: Diagnosis not present

## 2022-06-17 DIAGNOSIS — S0080XA Unspecified superficial injury of other part of head, initial encounter: Secondary | ICD-10-CM | POA: Diagnosis not present

## 2022-06-17 DIAGNOSIS — L570 Actinic keratosis: Secondary | ICD-10-CM | POA: Diagnosis not present

## 2022-06-22 DIAGNOSIS — N183 Chronic kidney disease, stage 3 unspecified: Secondary | ICD-10-CM | POA: Diagnosis not present

## 2022-06-22 DIAGNOSIS — I10 Essential (primary) hypertension: Secondary | ICD-10-CM | POA: Diagnosis not present

## 2022-06-22 DIAGNOSIS — I129 Hypertensive chronic kidney disease with stage 1 through stage 4 chronic kidney disease, or unspecified chronic kidney disease: Secondary | ICD-10-CM | POA: Diagnosis not present

## 2022-06-22 DIAGNOSIS — M150 Primary generalized (osteo)arthritis: Secondary | ICD-10-CM | POA: Diagnosis not present

## 2022-06-29 DIAGNOSIS — I10 Essential (primary) hypertension: Secondary | ICD-10-CM | POA: Diagnosis not present

## 2022-06-29 DIAGNOSIS — M159 Polyosteoarthritis, unspecified: Secondary | ICD-10-CM | POA: Diagnosis not present

## 2022-06-29 DIAGNOSIS — E782 Mixed hyperlipidemia: Secondary | ICD-10-CM | POA: Diagnosis not present

## 2022-06-29 DIAGNOSIS — R6889 Other general symptoms and signs: Secondary | ICD-10-CM | POA: Diagnosis not present

## 2022-06-29 DIAGNOSIS — R5381 Other malaise: Secondary | ICD-10-CM | POA: Diagnosis not present

## 2022-06-30 DIAGNOSIS — R0989 Other specified symptoms and signs involving the circulatory and respiratory systems: Secondary | ICD-10-CM | POA: Diagnosis not present

## 2022-07-03 DIAGNOSIS — K5900 Constipation, unspecified: Secondary | ICD-10-CM | POA: Diagnosis not present

## 2022-07-03 DIAGNOSIS — J986 Disorders of diaphragm: Secondary | ICD-10-CM | POA: Diagnosis not present

## 2022-07-03 DIAGNOSIS — E876 Hypokalemia: Secondary | ICD-10-CM | POA: Diagnosis not present

## 2022-07-03 DIAGNOSIS — G9341 Metabolic encephalopathy: Secondary | ICD-10-CM | POA: Diagnosis not present

## 2022-07-03 DIAGNOSIS — R0600 Dyspnea, unspecified: Secondary | ICD-10-CM | POA: Diagnosis not present

## 2022-07-03 DIAGNOSIS — R918 Other nonspecific abnormal finding of lung field: Secondary | ICD-10-CM | POA: Diagnosis not present

## 2022-07-03 DIAGNOSIS — Z741 Need for assistance with personal care: Secondary | ICD-10-CM | POA: Diagnosis not present

## 2022-07-03 DIAGNOSIS — N183 Chronic kidney disease, stage 3 unspecified: Secondary | ICD-10-CM | POA: Diagnosis not present

## 2022-07-03 DIAGNOSIS — N289 Disorder of kidney and ureter, unspecified: Secondary | ICD-10-CM | POA: Diagnosis not present

## 2022-07-03 DIAGNOSIS — R296 Repeated falls: Secondary | ICD-10-CM | POA: Diagnosis not present

## 2022-07-03 DIAGNOSIS — J9811 Atelectasis: Secondary | ICD-10-CM | POA: Diagnosis not present

## 2022-07-03 DIAGNOSIS — E860 Dehydration: Secondary | ICD-10-CM | POA: Diagnosis not present

## 2022-07-03 DIAGNOSIS — B9789 Other viral agents as the cause of diseases classified elsewhere: Secondary | ICD-10-CM | POA: Diagnosis not present

## 2022-07-03 DIAGNOSIS — I4891 Unspecified atrial fibrillation: Secondary | ICD-10-CM | POA: Diagnosis not present

## 2022-07-03 DIAGNOSIS — R0902 Hypoxemia: Secondary | ICD-10-CM | POA: Diagnosis not present

## 2022-07-03 DIAGNOSIS — R0602 Shortness of breath: Secondary | ICD-10-CM | POA: Diagnosis not present

## 2022-07-03 DIAGNOSIS — R7989 Other specified abnormal findings of blood chemistry: Secondary | ICD-10-CM | POA: Diagnosis not present

## 2022-07-03 DIAGNOSIS — R9431 Abnormal electrocardiogram [ECG] [EKG]: Secondary | ICD-10-CM | POA: Diagnosis not present

## 2022-07-03 DIAGNOSIS — Z85820 Personal history of malignant melanoma of skin: Secondary | ICD-10-CM | POA: Diagnosis not present

## 2022-07-03 DIAGNOSIS — R059 Cough, unspecified: Secondary | ICD-10-CM | POA: Diagnosis not present

## 2022-07-03 DIAGNOSIS — H3530 Unspecified macular degeneration: Secondary | ICD-10-CM | POA: Diagnosis not present

## 2022-07-03 DIAGNOSIS — J9690 Respiratory failure, unspecified, unspecified whether with hypoxia or hypercapnia: Secondary | ICD-10-CM | POA: Diagnosis not present

## 2022-07-03 DIAGNOSIS — Z66 Do not resuscitate: Secondary | ICD-10-CM | POA: Diagnosis not present

## 2022-07-03 DIAGNOSIS — G629 Polyneuropathy, unspecified: Secondary | ICD-10-CM | POA: Diagnosis not present

## 2022-07-03 DIAGNOSIS — R069 Unspecified abnormalities of breathing: Secondary | ICD-10-CM | POA: Diagnosis not present

## 2022-07-03 DIAGNOSIS — R2681 Unsteadiness on feet: Secondary | ICD-10-CM | POA: Diagnosis not present

## 2022-07-03 DIAGNOSIS — Z87891 Personal history of nicotine dependence: Secondary | ICD-10-CM | POA: Diagnosis not present

## 2022-07-03 DIAGNOSIS — R197 Diarrhea, unspecified: Secondary | ICD-10-CM | POA: Diagnosis not present

## 2022-07-03 DIAGNOSIS — I10 Essential (primary) hypertension: Secondary | ICD-10-CM | POA: Diagnosis not present

## 2022-07-03 DIAGNOSIS — Z743 Need for continuous supervision: Secondary | ICD-10-CM | POA: Diagnosis not present

## 2022-07-03 DIAGNOSIS — Z9181 History of falling: Secondary | ICD-10-CM | POA: Diagnosis not present

## 2022-07-03 DIAGNOSIS — R1310 Dysphagia, unspecified: Secondary | ICD-10-CM | POA: Diagnosis not present

## 2022-07-03 DIAGNOSIS — M6281 Muscle weakness (generalized): Secondary | ICD-10-CM | POA: Diagnosis not present

## 2022-07-03 DIAGNOSIS — Z515 Encounter for palliative care: Secondary | ICD-10-CM | POA: Diagnosis not present

## 2022-07-03 DIAGNOSIS — Z79899 Other long term (current) drug therapy: Secondary | ICD-10-CM | POA: Diagnosis not present

## 2022-07-03 DIAGNOSIS — I129 Hypertensive chronic kidney disease with stage 1 through stage 4 chronic kidney disease, or unspecified chronic kidney disease: Secondary | ICD-10-CM | POA: Diagnosis not present

## 2022-07-03 DIAGNOSIS — H409 Unspecified glaucoma: Secondary | ICD-10-CM | POA: Diagnosis not present

## 2022-07-03 DIAGNOSIS — I517 Cardiomegaly: Secondary | ICD-10-CM | POA: Diagnosis not present

## 2022-07-03 DIAGNOSIS — I4581 Long QT syndrome: Secondary | ICD-10-CM | POA: Diagnosis not present

## 2022-07-03 DIAGNOSIS — Z7189 Other specified counseling: Secondary | ICD-10-CM | POA: Diagnosis not present

## 2022-07-03 DIAGNOSIS — J984 Other disorders of lung: Secondary | ICD-10-CM | POA: Diagnosis not present

## 2022-07-03 DIAGNOSIS — Z9981 Dependence on supplemental oxygen: Secondary | ICD-10-CM | POA: Diagnosis not present

## 2022-07-03 DIAGNOSIS — K5904 Chronic idiopathic constipation: Secondary | ICD-10-CM | POA: Diagnosis not present

## 2022-07-03 DIAGNOSIS — I088 Other rheumatic multiple valve diseases: Secondary | ICD-10-CM | POA: Diagnosis not present

## 2022-07-03 DIAGNOSIS — E785 Hyperlipidemia, unspecified: Secondary | ICD-10-CM | POA: Diagnosis not present

## 2022-07-03 DIAGNOSIS — W19XXXA Unspecified fall, initial encounter: Secondary | ICD-10-CM | POA: Diagnosis not present

## 2022-07-03 DIAGNOSIS — M1990 Unspecified osteoarthritis, unspecified site: Secondary | ICD-10-CM | POA: Diagnosis not present

## 2022-07-03 DIAGNOSIS — I493 Ventricular premature depolarization: Secondary | ICD-10-CM | POA: Diagnosis not present

## 2022-07-03 DIAGNOSIS — J9601 Acute respiratory failure with hypoxia: Secondary | ICD-10-CM | POA: Diagnosis not present

## 2022-07-03 DIAGNOSIS — J849 Interstitial pulmonary disease, unspecified: Secondary | ICD-10-CM | POA: Diagnosis not present

## 2022-07-12 DIAGNOSIS — N390 Urinary tract infection, site not specified: Secondary | ICD-10-CM | POA: Diagnosis not present

## 2022-07-12 DIAGNOSIS — H409 Unspecified glaucoma: Secondary | ICD-10-CM | POA: Diagnosis not present

## 2022-07-12 DIAGNOSIS — M1990 Unspecified osteoarthritis, unspecified site: Secondary | ICD-10-CM | POA: Diagnosis not present

## 2022-07-12 DIAGNOSIS — M79671 Pain in right foot: Secondary | ICD-10-CM | POA: Diagnosis not present

## 2022-07-12 DIAGNOSIS — E785 Hyperlipidemia, unspecified: Secondary | ICD-10-CM | POA: Diagnosis not present

## 2022-07-12 DIAGNOSIS — H3530 Unspecified macular degeneration: Secondary | ICD-10-CM | POA: Diagnosis not present

## 2022-07-12 DIAGNOSIS — J986 Disorders of diaphragm: Secondary | ICD-10-CM | POA: Diagnosis not present

## 2022-07-12 DIAGNOSIS — R0902 Hypoxemia: Secondary | ICD-10-CM | POA: Diagnosis not present

## 2022-07-12 DIAGNOSIS — G629 Polyneuropathy, unspecified: Secondary | ICD-10-CM | POA: Diagnosis not present

## 2022-07-12 DIAGNOSIS — J9601 Acute respiratory failure with hypoxia: Secondary | ICD-10-CM | POA: Diagnosis not present

## 2022-07-12 DIAGNOSIS — E876 Hypokalemia: Secondary | ICD-10-CM | POA: Diagnosis not present

## 2022-07-12 DIAGNOSIS — Z87891 Personal history of nicotine dependence: Secondary | ICD-10-CM | POA: Diagnosis not present

## 2022-07-12 DIAGNOSIS — I4891 Unspecified atrial fibrillation: Secondary | ICD-10-CM | POA: Diagnosis not present

## 2022-07-12 DIAGNOSIS — N183 Chronic kidney disease, stage 3 unspecified: Secondary | ICD-10-CM | POA: Diagnosis not present

## 2022-07-12 DIAGNOSIS — N289 Disorder of kidney and ureter, unspecified: Secondary | ICD-10-CM | POA: Diagnosis not present

## 2022-07-12 DIAGNOSIS — I4581 Long QT syndrome: Secondary | ICD-10-CM | POA: Diagnosis not present

## 2022-07-12 DIAGNOSIS — I5032 Chronic diastolic (congestive) heart failure: Secondary | ICD-10-CM | POA: Diagnosis not present

## 2022-07-12 DIAGNOSIS — J9690 Respiratory failure, unspecified, unspecified whether with hypoxia or hypercapnia: Secondary | ICD-10-CM | POA: Diagnosis not present

## 2022-07-12 DIAGNOSIS — Z9181 History of falling: Secondary | ICD-10-CM | POA: Diagnosis not present

## 2022-07-12 DIAGNOSIS — M25571 Pain in right ankle and joints of right foot: Secondary | ICD-10-CM | POA: Diagnosis not present

## 2022-07-12 DIAGNOSIS — R0602 Shortness of breath: Secondary | ICD-10-CM | POA: Diagnosis not present

## 2022-07-12 DIAGNOSIS — K5904 Chronic idiopathic constipation: Secondary | ICD-10-CM | POA: Diagnosis not present

## 2022-07-12 DIAGNOSIS — R0600 Dyspnea, unspecified: Secondary | ICD-10-CM | POA: Diagnosis not present

## 2022-07-12 DIAGNOSIS — M25572 Pain in left ankle and joints of left foot: Secondary | ICD-10-CM | POA: Diagnosis not present

## 2022-07-12 DIAGNOSIS — I129 Hypertensive chronic kidney disease with stage 1 through stage 4 chronic kidney disease, or unspecified chronic kidney disease: Secondary | ICD-10-CM | POA: Diagnosis not present

## 2022-07-12 DIAGNOSIS — M79661 Pain in right lower leg: Secondary | ICD-10-CM | POA: Diagnosis not present

## 2022-07-12 DIAGNOSIS — I493 Ventricular premature depolarization: Secondary | ICD-10-CM | POA: Diagnosis not present

## 2022-07-12 DIAGNOSIS — R601 Generalized edema: Secondary | ICD-10-CM | POA: Diagnosis not present

## 2022-07-12 DIAGNOSIS — Z7901 Long term (current) use of anticoagulants: Secondary | ICD-10-CM | POA: Diagnosis not present

## 2022-07-12 DIAGNOSIS — R1310 Dysphagia, unspecified: Secondary | ICD-10-CM | POA: Diagnosis not present

## 2022-07-12 DIAGNOSIS — M6281 Muscle weakness (generalized): Secondary | ICD-10-CM | POA: Diagnosis not present

## 2022-07-12 DIAGNOSIS — Z9981 Dependence on supplemental oxygen: Secondary | ICD-10-CM | POA: Diagnosis not present

## 2022-07-12 DIAGNOSIS — Z743 Need for continuous supervision: Secondary | ICD-10-CM | POA: Diagnosis not present

## 2022-07-12 DIAGNOSIS — Z741 Need for assistance with personal care: Secondary | ICD-10-CM | POA: Diagnosis not present

## 2022-07-12 DIAGNOSIS — Z515 Encounter for palliative care: Secondary | ICD-10-CM | POA: Diagnosis not present

## 2022-07-12 DIAGNOSIS — M79662 Pain in left lower leg: Secondary | ICD-10-CM | POA: Diagnosis not present

## 2022-07-12 DIAGNOSIS — Z7189 Other specified counseling: Secondary | ICD-10-CM | POA: Diagnosis not present

## 2022-07-12 DIAGNOSIS — M79672 Pain in left foot: Secondary | ICD-10-CM | POA: Diagnosis not present

## 2022-07-12 DIAGNOSIS — R2681 Unsteadiness on feet: Secondary | ICD-10-CM | POA: Diagnosis not present

## 2022-07-12 DIAGNOSIS — I480 Paroxysmal atrial fibrillation: Secondary | ICD-10-CM | POA: Diagnosis not present

## 2022-07-15 DIAGNOSIS — N390 Urinary tract infection, site not specified: Secondary | ICD-10-CM | POA: Diagnosis not present

## 2022-07-19 DIAGNOSIS — M25571 Pain in right ankle and joints of right foot: Secondary | ICD-10-CM | POA: Diagnosis not present

## 2022-07-19 DIAGNOSIS — M79671 Pain in right foot: Secondary | ICD-10-CM | POA: Diagnosis not present

## 2022-07-19 DIAGNOSIS — M25572 Pain in left ankle and joints of left foot: Secondary | ICD-10-CM | POA: Diagnosis not present

## 2022-07-19 DIAGNOSIS — M79661 Pain in right lower leg: Secondary | ICD-10-CM | POA: Diagnosis not present

## 2022-07-19 DIAGNOSIS — M79662 Pain in left lower leg: Secondary | ICD-10-CM | POA: Diagnosis not present

## 2022-07-19 DIAGNOSIS — M79672 Pain in left foot: Secondary | ICD-10-CM | POA: Diagnosis not present

## 2022-07-20 DIAGNOSIS — R601 Generalized edema: Secondary | ICD-10-CM | POA: Diagnosis not present

## 2022-07-22 ENCOUNTER — Other Ambulatory Visit: Payer: Self-pay | Admitting: *Deleted

## 2022-07-22 NOTE — Patient Outreach (Signed)
Mr. Dunlap admitted to [facility]) SNF on 07/12/22.   Update received from SNF SW indicated transition plan is to return to [facility]F upon SNF discharge.  No identifiable care coordination needs at this time.     Marthenia Rolling, MSN, RN,BSN [place] Acute [facility] Coordinator [phone] [facility]) [phone]  (Toll free office)

## 2022-07-27 ENCOUNTER — Ambulatory Visit: Payer: Medicare Other | Admitting: Cardiology

## 2022-07-27 ENCOUNTER — Encounter: Payer: Self-pay | Admitting: Cardiology

## 2022-07-27 VITALS — BP 119/51 | HR 60 | Temp 97.8°F | Resp 16 | Ht 68.0 in | Wt 157.0 lb

## 2022-07-27 DIAGNOSIS — Z87891 Personal history of nicotine dependence: Secondary | ICD-10-CM

## 2022-07-27 DIAGNOSIS — R0602 Shortness of breath: Secondary | ICD-10-CM

## 2022-07-27 DIAGNOSIS — I493 Ventricular premature depolarization: Secondary | ICD-10-CM | POA: Diagnosis not present

## 2022-07-27 DIAGNOSIS — I5032 Chronic diastolic (congestive) heart failure: Secondary | ICD-10-CM

## 2022-07-27 DIAGNOSIS — I480 Paroxysmal atrial fibrillation: Secondary | ICD-10-CM | POA: Diagnosis not present

## 2022-07-27 DIAGNOSIS — Z7901 Long term (current) use of anticoagulants: Secondary | ICD-10-CM

## 2022-07-27 DIAGNOSIS — I129 Hypertensive chronic kidney disease with stage 1 through stage 4 chronic kidney disease, or unspecified chronic kidney disease: Secondary | ICD-10-CM | POA: Diagnosis not present

## 2022-07-27 DIAGNOSIS — N183 Chronic kidney disease, stage 3 unspecified: Secondary | ICD-10-CM | POA: Diagnosis not present

## 2022-07-27 NOTE — Progress Notes (Signed)
Date: 07/27/22 Last Office Visit: 05/27/2022  Chief Complaint  Patient presents with   Shortness of Breath   Hospitalization Follow-up    HPI  Shane Warwick Sr. is a 88 y.o. male whose past medical history and cardiac risk factors include: Paroxysmal atrial fibrillation, HFpEF, hypertension with chronic kidney disease stage III, chronic venous insufficiency, PVCs, advanced age.  Patient is accompanied staff member from [employer].  He provides verbal consent for having her present during today's encounter.  Patient has been followed by the practice for PVC management which has been well controlled on diltiazem (did not tolerate metoprolol well).  His lower extremity swelling has improved significantly with reduction of canned foods.  He now presents today for posthospitalization follow-up since last office visit.  Patient was admitted to [facility] in August 2023 for approximately 7 days for hypoxia likely due to pneumonia and rhinovirus infection.  During the hospitalization he was also noted to have new onset of atrial fibrillation and started on oral anticoagulation for thromboembolic prophylaxis and remains on diltiazem.  He also had findings of new onset of HFpEF with elevated BNP and clinical symptoms of CHF.  Currently denies angina factors or shortness of breath.  He is on 2 L nasal cannula oxygen.  Does not recall the last time he fell.  No prior history of gastrointestinal or intracranial bleeding.   ALLERGIES: Allergies  Allergen Reactions   Tape Rash   MEDICATION LIST PRIOR TO VISIT: Current Outpatient Medications on File Prior to Visit  Medication Sig Dispense Refill   acetaminophen (TYLENOL) 325 MG tablet Take 650 mg by mouth every 6 (six) hours as needed.     calcium carbonate (TUMS CHEWY BITES) 750 MG chewable tablet Chew 1 tablet by mouth as needed for heartburn.     dextromethorphan (DELSYM) 30 MG/5ML liquid Take by mouth at bedtime as needed.      diltiazem (CARDIZEM CD) 120 MG 24 hr capsule TAKE 1 CAPSULE BY MOUTH EVERY DAY 90 capsule 0   ELIQUIS 5 MG TABS tablet Take 5 mg by mouth 2 (two) times daily.     furosemide (LASIX) 20 MG tablet Take 20 mg by mouth daily.     guaiFENesin (MUCINEX) 600 MG 12 hr tablet Take by mouth.     INCRUSE ELLIPTA 62.5 MCG/ACT AEPB Inhale 1 puff into the lungs daily.     latanoprost (XALATAN) 0.005 % ophthalmic solution INSTILL 1 DROP INTO BOTH EYES EVERY DAY AT NIGHT     Multiple Vitamins-Minerals (PRESERVISION AREDS 2+MULTI VIT PO) Take 2 capsules by mouth daily.     Polyethyl Glycol-Propyl Glycol (SYSTANE) 0.4-0.3 % SOLN See admin instructions.     polyethylene glycol (MIRALAX / GLYCOLAX) 17 g packet 1 packet mixed with 8 ounces of fluid     pregabalin (LYRICA) 150 MG capsule Take 150 mg by mouth 2 (two) times daily.     hydrochlorothiazide (MICROZIDE) 12.5 MG capsule TAKE 1 CAPSULE (12.5 MG TOTAL) BY MOUTH IN THE MORNING. (Patient not taking: Reported on 07/27/2022) 90 capsule 1   No current facility-administered medications on file prior to visit.    PAST MEDICAL HISTORY: Past Medical History:  Diagnosis Date   (HFpEF) heart failure with preserved ejection fraction (HCC)    AAA (abdominal aortic aneurysm) (HCC)    Chronic kidney disease    Glaucoma    Hyperlipemia    Hypertension    Paroxysmal atrial fibrillation (HCC)    PVC (premature ventricular contraction)  PAST SURGICAL HISTORY: Past Surgical History:  Procedure Laterality Date   APPENDECTOMY     EYE SURGERY     lens implants   HERNIA REPAIR     TENNIS ELBOW RELEASE/NIRSCHEL PROCEDURE     TONSILLECTOMY     TOTAL SHOULDER REPLACEMENT Bilateral     FAMILY HISTORY: The patient family history is not on file.   SOCIAL HISTORY:  The patient  reports that he quit smoking about 62 years ago. His smoking use included cigarettes. He has a 30.00 pack-year smoking history. He has never used smokeless tobacco. He reports current  alcohol use. He reports that he does not use drugs.  Review of Systems  Cardiovascular:  Negative for chest pain, claudication, dyspnea on exertion, irregular heartbeat, leg swelling, near-syncope, orthopnea, palpitations, paroxysmal nocturnal dyspnea and syncope.  Respiratory:  Negative for shortness of breath.   Hematologic/Lymphatic: Negative for bleeding problem.  Musculoskeletal:  Negative for muscle cramps and myalgias.  Neurological:  Negative for dizziness and light-headedness.    PHYSICAL EXAM:    07/27/2022    9:28 AM 05/27/2022    1:44 PM 05/27/2022    1:39 PM  Vitals with BMI  Height '5\' 8"'$   '5\' 8"'$   Weight 157 lbs  175 lbs  BMI 24.09  73.53  Systolic 299 242 683  Diastolic 51 82 79  Pulse 60 70 80   CONSTITUTIONAL: appears older than stated age, currently on nasal cannula oxygen, presents in a wheelchair, hemodynamically stable SKIN: Skin is warm and dry. No rash noted. No cyanosis. No pallor. No jaundice HEAD: Normocephalic and atraumatic.  EYES: No scleral icterus MOUTH/THROAT: Dry oral membranes.  NECK: No JVD present. No thyromegaly noted. No carotid bruits  CHEST Normal respiratory effort. No intercostal retractions  LUNGS: Clear to auscultation bilaterally.  No stridor. No wheezes. No rales.  CARDIOVASCULAR: Regular, [id number], holosystolic murmur heard at left sternal border, no gallops or rubs appreciated ABDOMINAL: Soft, nontender, nondistended, positive bowel sounds in all 4 quadrants, no apparent ascites. No bruit  EXTREMITIES: Bilateral 1+ peripheral edema, warm to touch HEMATOLOGIC: No significant bruising NEUROLOGIC: Oriented to person, place, and time. Nonfocal. Normal muscle tone.  PSYCHIATRIC: Normal mood and affect. Normal behavior. Cooperative  CARDIAC DATABASE: EKG: 03/27/2021: Normal sinus rhythm, 86 bpm, first-degree AV block, right bundle branch block, left axis deviation, left anterior fascicular block, frequent PVCs.  05/27/2022: Normal  sinus rhythm, 71 bpm, left axis, first-degree AV block, right bundle branch block, ST-T changes likely secondary RBBB.   07/27/22 Sinus Bradycardia, 58bpm, First degree A-V block, RBBB.   Echocardiogram: 04/07/2021:  Left ventricle cavity is normal in size. Moderate concentric hypertrophy of the left ventricle. Normal global wall motion. Normal LV systolic function with EF 60%. Doppler evidence of grade I (impaired) diastolic dysfunction, normal LAP.  Right ventricle cavity is mildly dilated.  Normal right ventricular function.  Moderate tricuspid regurgitation. Peak RA-RV gradient 27 mmHg.  Mild pulmonic regurgitation.  IVC not seen.  No significant change compared to previous study in 2019.   07/04/2022 @ [facility]:  Left Ventricle: Systolic function is low normal. EF: 50-55%. Ejection fraction measured by 3D is 50%,    Left Ventricle: Doppler parameters consistent with mild diastolic dysfunction and low to normal LA pressure.    Right Atrium: Right atrium is mildly to moderately dilated.    Aortic Valve: There is no regurgitation or stenosis.    Mitral Valve: There is moderate regurgitation.    Tricuspid Valve: There is moderate regurgitation.  Tricuspid Valve: The right ventricular systolic pressure is moderately elevated (50-59 mmHg).  Carotid artery duplex 07/27/2017: No hemodynamically significant arterial disease in the internal carotid artery bilaterally. Minimal soft plaque noted. Antegrade right vertebral artery flow. Antegrade left vertebral artery flow.  Ultrasound abdominal aorta:  12/18/2018: The maximum aorta diameter is 2.1 cm (mid). No evidence of atherosclerotic plaque. Normal flow velocities noted. No AAA noted.  LABORATORY DATA:    Latest Ref Rng & Units 01/27/2020   10:40 AM 07/22/2019    6:01 AM 03/30/2010    8:52 AM  CBC  WBC 4.0 - 10.5 K/uL 7.6  10.0  8.1   Hemoglobin 13.0 - 17.0 g/dL 15.6  14.9  16.1   Hematocrit 39.0 - 52.0 % 47.7  45.6  46.5    Platelets 150 - 400 K/uL 181  166  196        Latest Ref Rng & Units 04/23/2021    1:32 PM 01/27/2020   10:40 AM 07/22/2019    6:01 AM  CMP  Glucose 65 - 99 mg/dL 76  97  112   BUN 10 - 36 mg/dL '22  23  19   '$ Creatinine 0.76 - 1.27 mg/dL 1.22  1.21  1.17   Sodium 134 - 144 mmol/L 146  138  138   Potassium 3.5 - 5.2 mmol/L 4.8  4.2  3.7   Chloride 96 - 106 mmol/L 102  99  101   CO2 20 - 29 mmol/L '28  31  25   '$ Calcium 8.6 - 10.2 mg/dL 10.2  10.1  9.6   Total Protein 6.5 - 8.1 g/dL  7.0    Total Bilirubin 0.3 - 1.2 mg/dL  0.8    Alkaline Phos 38 - 126 U/L  85    AST 15 - 41 U/L  23    ALT 0 - 44 U/L  20      Lipid Panel  12/13/2018: Total cholesterol 173, HDL 53, LDL 93, triglycerides 137.  02/03/2021: Total cholesterol 155, triglycerides 88, HDL 55, LDL 83, non-HDL 100. Hemoglobin 14.8 g/dL, hematocrit 44.6% BUN 32, creatinine 1.22 g/dL, GFR 55. Sodium 144, potassium 4.1, chloride 106, bicarb 32, AST 16, ALT 16, alkaline phosphatase 79  External Labs: Collected: 07/20/2022 performed at [facility] clinical provided by the patient at today's visit. BUN 20.3, creatinine 0.98. BUN to creatinine ratio 20.7. Sodium 140, potassium 4.1, chloride 102, bicarb 31, AST 12, ALT 19, alkaline phosphatase 98   FINAL MEDICATION LIST END OF ENCOUNTER: No orders of the defined types were placed in this encounter.    Medications Discontinued During This Encounter  Medication Reason   latanoprost (XALATAN) 0.005 % ophthalmic solution      Current Outpatient Medications:    acetaminophen (TYLENOL) 325 MG tablet, Take 650 mg by mouth every 6 (six) hours as needed., Disp: , Rfl:    calcium carbonate (TUMS CHEWY BITES) 750 MG chewable tablet, Chew 1 tablet by mouth as needed for heartburn., Disp: , Rfl:    dextromethorphan (DELSYM) 30 MG/5ML liquid, Take by mouth at bedtime as needed., Disp: , Rfl:    diltiazem (CARDIZEM CD) 120 MG 24 hr capsule, TAKE 1 CAPSULE BY MOUTH EVERY DAY, Disp: 90 capsule,  Rfl: 0   ELIQUIS 5 MG TABS tablet, Take 5 mg by mouth 2 (two) times daily., Disp: , Rfl:    furosemide (LASIX) 20 MG tablet, Take 20 mg by mouth daily., Disp: , Rfl:    guaiFENesin (MUCINEX) 600 MG 12  hr tablet, Take by mouth., Disp: , Rfl:    INCRUSE ELLIPTA 62.5 MCG/ACT AEPB, Inhale 1 puff into the lungs daily., Disp: , Rfl:    latanoprost (XALATAN) 0.005 % ophthalmic solution, INSTILL 1 DROP INTO BOTH EYES EVERY DAY AT NIGHT, Disp: , Rfl:    Multiple Vitamins-Minerals (PRESERVISION AREDS 2+MULTI VIT PO), Take 2 capsules by mouth daily., Disp: , Rfl:    Polyethyl Glycol-Propyl Glycol (SYSTANE) 0.4-0.3 % SOLN, See admin instructions., Disp: , Rfl:    polyethylene glycol (MIRALAX / GLYCOLAX) 17 g packet, 1 packet mixed with 8 ounces of fluid, Disp: , Rfl:    pregabalin (LYRICA) 150 MG capsule, Take 150 mg by mouth 2 (two) times daily., Disp: , Rfl:    hydrochlorothiazide (MICROZIDE) 12.5 MG capsule, TAKE 1 CAPSULE (12.5 MG TOTAL) BY MOUTH IN THE MORNING. (Patient not taking: Reported on 07/27/2022), Disp: 90 capsule, Rfl: 1  IMPRESSION:    ICD-10-CM   1. Paroxysmal A-fib (HCC)  I48.0 Hemoglobin and hematocrit, blood    Basic metabolic panel    B Nat Peptide    2. Long term (current) use of anticoagulants  Z79.01 Hemoglobin and hematocrit, blood    Basic metabolic panel    B Nat Peptide    3. Chronic heart failure with preserved ejection fraction (HFpEF) (HCC)  I50.32     4. Benign hypertension with CKD (chronic kidney disease) stage III (HCC)  I12.9    N18.30     5. SOB (shortness of breath)  R06.02 EKG 12-Lead    6. PVC (premature ventricular contraction)  I49.3     7. Former smoker  Z87.891        RECOMMENDATIONS: Shane Medearis Sr. is a 88 y.o. male whose past medical history and cardiac risk factors include: Paroxysmal atrial fibrillation, HFpEF, hypertension with chronic kidney disease stage III, chronic venous insufficiency, PVCs, advanced age.  Paroxysmal A-fib  (HCC) Rate control: Diltiazem. Rhythm control: N/A. Thromboembolic prophylaxis: Eliquis Diagnosed at [facility] during his hospitalization in August 2023. No prior history of GI bleed or falls. We will check hemoglobin and hematocrit and renal function. CHA2DS2-VASc SCORE is 4 which correlates to 4% risk of stroke per year (HFpEF, HTN, age).   Long term (current) use of anticoagulants Indication: Paroxysmal atrial fibrillation. Reemphasized the risks, benefits, and alternatives to oral anticoagulation. We will check labs prior to next office visit  Chronic heart failure with preserved ejection fraction (HFpEF) ([place]) Recently diagnosed with congestive heart failure, HFpEF, during his hospitalization in August 2023. Medications reconciled. We will uptitrate GDMT at the next office visit as he is currently recovering from pneumonia, rhinovirus, and blood pressures are relatively soft for his age. Strict I's and O's and daily weights.  Benign hypertension with CKD (chronic kidney disease) stage III ([place]) Office blood pressure within acceptable limits. No changes warranted at today's office visit. Reemphasized importance of a low-salt diet.  Shortness of breath Improving. Multifactorial (rhinovirus, pneumonia, recent HFpEF, recent hospitalization for A-fib with RVR).  HFpEF and A-fib management as discussed above.  PVC (premature ventricular contraction) Continue diltiazem. Asymptomatic. EKG illustrates sinus bradycardia without ectopy.   Orders Placed This Encounter  Procedures   Hemoglobin and hematocrit, blood   Basic metabolic panel   B Nat Peptide   EKG 12-Lead   --Continue cardiac medications as reconciled in final medication list. --Return in about 6 months (around 01/27/2023) for Follow up, A. fib. Or sooner if needed. --Continue follow-up with your primary care physician regarding  the management of your other chronic comorbid conditions.  Patient's questions and  concerns were addressed to his satisfaction. He voices understanding of the instructions provided during this encounter.   This note was created using a voice recognition software as a result there may be grammatical errors inadvertently enclosed that do not reflect the nature of this encounter. Every attempt is made to correct such errors.  Rex Kras, [place], [facility]  Pager: [phone] Office: [phone]

## 2022-08-09 DIAGNOSIS — Z9181 History of falling: Secondary | ICD-10-CM | POA: Diagnosis not present

## 2022-08-09 DIAGNOSIS — M6281 Muscle weakness (generalized): Secondary | ICD-10-CM | POA: Diagnosis not present

## 2022-08-09 DIAGNOSIS — Z9981 Dependence on supplemental oxygen: Secondary | ICD-10-CM | POA: Diagnosis not present

## 2022-08-09 DIAGNOSIS — J9601 Acute respiratory failure with hypoxia: Secondary | ICD-10-CM | POA: Diagnosis not present

## 2022-08-10 DIAGNOSIS — E876 Hypokalemia: Secondary | ICD-10-CM | POA: Diagnosis not present

## 2022-08-10 DIAGNOSIS — I129 Hypertensive chronic kidney disease with stage 1 through stage 4 chronic kidney disease, or unspecified chronic kidney disease: Secondary | ICD-10-CM | POA: Diagnosis not present

## 2022-08-10 DIAGNOSIS — J986 Disorders of diaphragm: Secondary | ICD-10-CM | POA: Diagnosis not present

## 2022-08-10 DIAGNOSIS — Z7901 Long term (current) use of anticoagulants: Secondary | ICD-10-CM | POA: Diagnosis not present

## 2022-08-10 DIAGNOSIS — Z96611 Presence of right artificial shoulder joint: Secondary | ICD-10-CM | POA: Diagnosis not present

## 2022-08-10 DIAGNOSIS — Z87891 Personal history of nicotine dependence: Secondary | ICD-10-CM | POA: Diagnosis not present

## 2022-08-10 DIAGNOSIS — J9601 Acute respiratory failure with hypoxia: Secondary | ICD-10-CM | POA: Diagnosis not present

## 2022-08-10 DIAGNOSIS — H3530 Unspecified macular degeneration: Secondary | ICD-10-CM | POA: Diagnosis not present

## 2022-08-10 DIAGNOSIS — M1990 Unspecified osteoarthritis, unspecified site: Secondary | ICD-10-CM | POA: Diagnosis not present

## 2022-08-10 DIAGNOSIS — Z9981 Dependence on supplemental oxygen: Secondary | ICD-10-CM | POA: Diagnosis not present

## 2022-08-10 DIAGNOSIS — K5900 Constipation, unspecified: Secondary | ICD-10-CM | POA: Diagnosis not present

## 2022-08-10 DIAGNOSIS — G629 Polyneuropathy, unspecified: Secondary | ICD-10-CM | POA: Diagnosis not present

## 2022-08-10 DIAGNOSIS — Z9181 History of falling: Secondary | ICD-10-CM | POA: Diagnosis not present

## 2022-08-10 DIAGNOSIS — E785 Hyperlipidemia, unspecified: Secondary | ICD-10-CM | POA: Diagnosis not present

## 2022-08-10 DIAGNOSIS — N182 Chronic kidney disease, stage 2 (mild): Secondary | ICD-10-CM | POA: Diagnosis not present

## 2022-08-10 DIAGNOSIS — H409 Unspecified glaucoma: Secondary | ICD-10-CM | POA: Diagnosis not present

## 2022-08-10 DIAGNOSIS — I4891 Unspecified atrial fibrillation: Secondary | ICD-10-CM | POA: Diagnosis not present

## 2022-08-10 DIAGNOSIS — E119 Type 2 diabetes mellitus without complications: Secondary | ICD-10-CM | POA: Diagnosis not present

## 2022-08-10 DIAGNOSIS — N183 Chronic kidney disease, stage 3 unspecified: Secondary | ICD-10-CM | POA: Diagnosis not present

## 2022-08-10 DIAGNOSIS — Z96612 Presence of left artificial shoulder joint: Secondary | ICD-10-CM | POA: Diagnosis not present

## 2022-08-14 DIAGNOSIS — M1990 Unspecified osteoarthritis, unspecified site: Secondary | ICD-10-CM | POA: Diagnosis not present

## 2022-08-14 DIAGNOSIS — K5900 Constipation, unspecified: Secondary | ICD-10-CM | POA: Diagnosis not present

## 2022-08-14 DIAGNOSIS — I4891 Unspecified atrial fibrillation: Secondary | ICD-10-CM | POA: Diagnosis not present

## 2022-08-14 DIAGNOSIS — Z9181 History of falling: Secondary | ICD-10-CM | POA: Diagnosis not present

## 2022-08-14 DIAGNOSIS — Z96612 Presence of left artificial shoulder joint: Secondary | ICD-10-CM | POA: Diagnosis not present

## 2022-08-14 DIAGNOSIS — Z9981 Dependence on supplemental oxygen: Secondary | ICD-10-CM | POA: Diagnosis not present

## 2022-08-14 DIAGNOSIS — G629 Polyneuropathy, unspecified: Secondary | ICD-10-CM | POA: Diagnosis not present

## 2022-08-14 DIAGNOSIS — Z87891 Personal history of nicotine dependence: Secondary | ICD-10-CM | POA: Diagnosis not present

## 2022-08-14 DIAGNOSIS — Z7901 Long term (current) use of anticoagulants: Secondary | ICD-10-CM | POA: Diagnosis not present

## 2022-08-14 DIAGNOSIS — H409 Unspecified glaucoma: Secondary | ICD-10-CM | POA: Diagnosis not present

## 2022-08-14 DIAGNOSIS — E785 Hyperlipidemia, unspecified: Secondary | ICD-10-CM | POA: Diagnosis not present

## 2022-08-14 DIAGNOSIS — J986 Disorders of diaphragm: Secondary | ICD-10-CM | POA: Diagnosis not present

## 2022-08-14 DIAGNOSIS — Z96611 Presence of right artificial shoulder joint: Secondary | ICD-10-CM | POA: Diagnosis not present

## 2022-08-14 DIAGNOSIS — I129 Hypertensive chronic kidney disease with stage 1 through stage 4 chronic kidney disease, or unspecified chronic kidney disease: Secondary | ICD-10-CM | POA: Diagnosis not present

## 2022-08-14 DIAGNOSIS — J9601 Acute respiratory failure with hypoxia: Secondary | ICD-10-CM | POA: Diagnosis not present

## 2022-08-14 DIAGNOSIS — H3530 Unspecified macular degeneration: Secondary | ICD-10-CM | POA: Diagnosis not present

## 2022-08-14 DIAGNOSIS — N183 Chronic kidney disease, stage 3 unspecified: Secondary | ICD-10-CM | POA: Diagnosis not present

## 2022-08-16 DIAGNOSIS — I4891 Unspecified atrial fibrillation: Secondary | ICD-10-CM | POA: Diagnosis not present

## 2022-08-16 DIAGNOSIS — E785 Hyperlipidemia, unspecified: Secondary | ICD-10-CM | POA: Diagnosis not present

## 2022-08-16 DIAGNOSIS — N183 Chronic kidney disease, stage 3 unspecified: Secondary | ICD-10-CM | POA: Diagnosis not present

## 2022-08-16 DIAGNOSIS — K5900 Constipation, unspecified: Secondary | ICD-10-CM | POA: Diagnosis not present

## 2022-08-16 DIAGNOSIS — Z7901 Long term (current) use of anticoagulants: Secondary | ICD-10-CM | POA: Diagnosis not present

## 2022-08-16 DIAGNOSIS — Z96611 Presence of right artificial shoulder joint: Secondary | ICD-10-CM | POA: Diagnosis not present

## 2022-08-16 DIAGNOSIS — G629 Polyneuropathy, unspecified: Secondary | ICD-10-CM | POA: Diagnosis not present

## 2022-08-16 DIAGNOSIS — J986 Disorders of diaphragm: Secondary | ICD-10-CM | POA: Diagnosis not present

## 2022-08-16 DIAGNOSIS — Z9181 History of falling: Secondary | ICD-10-CM | POA: Diagnosis not present

## 2022-08-16 DIAGNOSIS — Z9981 Dependence on supplemental oxygen: Secondary | ICD-10-CM | POA: Diagnosis not present

## 2022-08-16 DIAGNOSIS — H3530 Unspecified macular degeneration: Secondary | ICD-10-CM | POA: Diagnosis not present

## 2022-08-16 DIAGNOSIS — Z96612 Presence of left artificial shoulder joint: Secondary | ICD-10-CM | POA: Diagnosis not present

## 2022-08-16 DIAGNOSIS — I129 Hypertensive chronic kidney disease with stage 1 through stage 4 chronic kidney disease, or unspecified chronic kidney disease: Secondary | ICD-10-CM | POA: Diagnosis not present

## 2022-08-16 DIAGNOSIS — M1990 Unspecified osteoarthritis, unspecified site: Secondary | ICD-10-CM | POA: Diagnosis not present

## 2022-08-16 DIAGNOSIS — Z87891 Personal history of nicotine dependence: Secondary | ICD-10-CM | POA: Diagnosis not present

## 2022-08-16 DIAGNOSIS — H409 Unspecified glaucoma: Secondary | ICD-10-CM | POA: Diagnosis not present

## 2022-08-16 DIAGNOSIS — J9601 Acute respiratory failure with hypoxia: Secondary | ICD-10-CM | POA: Diagnosis not present

## 2022-08-19 DIAGNOSIS — I4891 Unspecified atrial fibrillation: Secondary | ICD-10-CM | POA: Diagnosis not present

## 2022-08-19 DIAGNOSIS — K5900 Constipation, unspecified: Secondary | ICD-10-CM | POA: Diagnosis not present

## 2022-08-19 DIAGNOSIS — Z96611 Presence of right artificial shoulder joint: Secondary | ICD-10-CM | POA: Diagnosis not present

## 2022-08-19 DIAGNOSIS — Z9181 History of falling: Secondary | ICD-10-CM | POA: Diagnosis not present

## 2022-08-19 DIAGNOSIS — Z87891 Personal history of nicotine dependence: Secondary | ICD-10-CM | POA: Diagnosis not present

## 2022-08-19 DIAGNOSIS — N183 Chronic kidney disease, stage 3 unspecified: Secondary | ICD-10-CM | POA: Diagnosis not present

## 2022-08-19 DIAGNOSIS — Z7901 Long term (current) use of anticoagulants: Secondary | ICD-10-CM | POA: Diagnosis not present

## 2022-08-19 DIAGNOSIS — H409 Unspecified glaucoma: Secondary | ICD-10-CM | POA: Diagnosis not present

## 2022-08-19 DIAGNOSIS — G629 Polyneuropathy, unspecified: Secondary | ICD-10-CM | POA: Diagnosis not present

## 2022-08-19 DIAGNOSIS — H3530 Unspecified macular degeneration: Secondary | ICD-10-CM | POA: Diagnosis not present

## 2022-08-19 DIAGNOSIS — E785 Hyperlipidemia, unspecified: Secondary | ICD-10-CM | POA: Diagnosis not present

## 2022-08-19 DIAGNOSIS — Z9981 Dependence on supplemental oxygen: Secondary | ICD-10-CM | POA: Diagnosis not present

## 2022-08-19 DIAGNOSIS — J9601 Acute respiratory failure with hypoxia: Secondary | ICD-10-CM | POA: Diagnosis not present

## 2022-08-19 DIAGNOSIS — I129 Hypertensive chronic kidney disease with stage 1 through stage 4 chronic kidney disease, or unspecified chronic kidney disease: Secondary | ICD-10-CM | POA: Diagnosis not present

## 2022-08-19 DIAGNOSIS — M1990 Unspecified osteoarthritis, unspecified site: Secondary | ICD-10-CM | POA: Diagnosis not present

## 2022-08-19 DIAGNOSIS — J986 Disorders of diaphragm: Secondary | ICD-10-CM | POA: Diagnosis not present

## 2022-08-19 DIAGNOSIS — Z96612 Presence of left artificial shoulder joint: Secondary | ICD-10-CM | POA: Diagnosis not present

## 2022-08-20 DIAGNOSIS — E785 Hyperlipidemia, unspecified: Secondary | ICD-10-CM | POA: Diagnosis not present

## 2022-08-20 DIAGNOSIS — J9601 Acute respiratory failure with hypoxia: Secondary | ICD-10-CM | POA: Diagnosis not present

## 2022-08-20 DIAGNOSIS — G629 Polyneuropathy, unspecified: Secondary | ICD-10-CM | POA: Diagnosis not present

## 2022-08-20 DIAGNOSIS — I4891 Unspecified atrial fibrillation: Secondary | ICD-10-CM | POA: Diagnosis not present

## 2022-08-20 DIAGNOSIS — Z9981 Dependence on supplemental oxygen: Secondary | ICD-10-CM | POA: Diagnosis not present

## 2022-08-20 DIAGNOSIS — H3530 Unspecified macular degeneration: Secondary | ICD-10-CM | POA: Diagnosis not present

## 2022-08-20 DIAGNOSIS — Z96611 Presence of right artificial shoulder joint: Secondary | ICD-10-CM | POA: Diagnosis not present

## 2022-08-20 DIAGNOSIS — J986 Disorders of diaphragm: Secondary | ICD-10-CM | POA: Diagnosis not present

## 2022-08-20 DIAGNOSIS — Z9181 History of falling: Secondary | ICD-10-CM | POA: Diagnosis not present

## 2022-08-20 DIAGNOSIS — I129 Hypertensive chronic kidney disease with stage 1 through stage 4 chronic kidney disease, or unspecified chronic kidney disease: Secondary | ICD-10-CM | POA: Diagnosis not present

## 2022-08-20 DIAGNOSIS — H409 Unspecified glaucoma: Secondary | ICD-10-CM | POA: Diagnosis not present

## 2022-08-20 DIAGNOSIS — Z7901 Long term (current) use of anticoagulants: Secondary | ICD-10-CM | POA: Diagnosis not present

## 2022-08-20 DIAGNOSIS — Z87891 Personal history of nicotine dependence: Secondary | ICD-10-CM | POA: Diagnosis not present

## 2022-08-20 DIAGNOSIS — N183 Chronic kidney disease, stage 3 unspecified: Secondary | ICD-10-CM | POA: Diagnosis not present

## 2022-08-20 DIAGNOSIS — K5900 Constipation, unspecified: Secondary | ICD-10-CM | POA: Diagnosis not present

## 2022-08-20 DIAGNOSIS — Z96612 Presence of left artificial shoulder joint: Secondary | ICD-10-CM | POA: Diagnosis not present

## 2022-08-20 DIAGNOSIS — M1990 Unspecified osteoarthritis, unspecified site: Secondary | ICD-10-CM | POA: Diagnosis not present

## 2022-08-23 DIAGNOSIS — E785 Hyperlipidemia, unspecified: Secondary | ICD-10-CM | POA: Diagnosis not present

## 2022-08-23 DIAGNOSIS — Z96612 Presence of left artificial shoulder joint: Secondary | ICD-10-CM | POA: Diagnosis not present

## 2022-08-23 DIAGNOSIS — M1990 Unspecified osteoarthritis, unspecified site: Secondary | ICD-10-CM | POA: Diagnosis not present

## 2022-08-23 DIAGNOSIS — Z9981 Dependence on supplemental oxygen: Secondary | ICD-10-CM | POA: Diagnosis not present

## 2022-08-23 DIAGNOSIS — N183 Chronic kidney disease, stage 3 unspecified: Secondary | ICD-10-CM | POA: Diagnosis not present

## 2022-08-23 DIAGNOSIS — H3530 Unspecified macular degeneration: Secondary | ICD-10-CM | POA: Diagnosis not present

## 2022-08-23 DIAGNOSIS — Z87891 Personal history of nicotine dependence: Secondary | ICD-10-CM | POA: Diagnosis not present

## 2022-08-23 DIAGNOSIS — J9601 Acute respiratory failure with hypoxia: Secondary | ICD-10-CM | POA: Diagnosis not present

## 2022-08-23 DIAGNOSIS — I4891 Unspecified atrial fibrillation: Secondary | ICD-10-CM | POA: Diagnosis not present

## 2022-08-23 DIAGNOSIS — I129 Hypertensive chronic kidney disease with stage 1 through stage 4 chronic kidney disease, or unspecified chronic kidney disease: Secondary | ICD-10-CM | POA: Diagnosis not present

## 2022-08-23 DIAGNOSIS — J986 Disorders of diaphragm: Secondary | ICD-10-CM | POA: Diagnosis not present

## 2022-08-23 DIAGNOSIS — Z96611 Presence of right artificial shoulder joint: Secondary | ICD-10-CM | POA: Diagnosis not present

## 2022-08-23 DIAGNOSIS — K5900 Constipation, unspecified: Secondary | ICD-10-CM | POA: Diagnosis not present

## 2022-08-23 DIAGNOSIS — Z9181 History of falling: Secondary | ICD-10-CM | POA: Diagnosis not present

## 2022-08-23 DIAGNOSIS — Z7901 Long term (current) use of anticoagulants: Secondary | ICD-10-CM | POA: Diagnosis not present

## 2022-08-23 DIAGNOSIS — H409 Unspecified glaucoma: Secondary | ICD-10-CM | POA: Diagnosis not present

## 2022-08-23 DIAGNOSIS — G629 Polyneuropathy, unspecified: Secondary | ICD-10-CM | POA: Diagnosis not present

## 2022-08-24 DIAGNOSIS — Z87891 Personal history of nicotine dependence: Secondary | ICD-10-CM | POA: Diagnosis not present

## 2022-08-24 DIAGNOSIS — Z96612 Presence of left artificial shoulder joint: Secondary | ICD-10-CM | POA: Diagnosis not present

## 2022-08-24 DIAGNOSIS — K5900 Constipation, unspecified: Secondary | ICD-10-CM | POA: Diagnosis not present

## 2022-08-24 DIAGNOSIS — H409 Unspecified glaucoma: Secondary | ICD-10-CM | POA: Diagnosis not present

## 2022-08-24 DIAGNOSIS — N183 Chronic kidney disease, stage 3 unspecified: Secondary | ICD-10-CM | POA: Diagnosis not present

## 2022-08-24 DIAGNOSIS — Z7901 Long term (current) use of anticoagulants: Secondary | ICD-10-CM | POA: Diagnosis not present

## 2022-08-24 DIAGNOSIS — I129 Hypertensive chronic kidney disease with stage 1 through stage 4 chronic kidney disease, or unspecified chronic kidney disease: Secondary | ICD-10-CM | POA: Diagnosis not present

## 2022-08-24 DIAGNOSIS — J986 Disorders of diaphragm: Secondary | ICD-10-CM | POA: Diagnosis not present

## 2022-08-24 DIAGNOSIS — Z96611 Presence of right artificial shoulder joint: Secondary | ICD-10-CM | POA: Diagnosis not present

## 2022-08-24 DIAGNOSIS — Z9981 Dependence on supplemental oxygen: Secondary | ICD-10-CM | POA: Diagnosis not present

## 2022-08-24 DIAGNOSIS — J9601 Acute respiratory failure with hypoxia: Secondary | ICD-10-CM | POA: Diagnosis not present

## 2022-08-24 DIAGNOSIS — Z9181 History of falling: Secondary | ICD-10-CM | POA: Diagnosis not present

## 2022-08-24 DIAGNOSIS — H3530 Unspecified macular degeneration: Secondary | ICD-10-CM | POA: Diagnosis not present

## 2022-08-24 DIAGNOSIS — M1990 Unspecified osteoarthritis, unspecified site: Secondary | ICD-10-CM | POA: Diagnosis not present

## 2022-08-24 DIAGNOSIS — G629 Polyneuropathy, unspecified: Secondary | ICD-10-CM | POA: Diagnosis not present

## 2022-08-24 DIAGNOSIS — E785 Hyperlipidemia, unspecified: Secondary | ICD-10-CM | POA: Diagnosis not present

## 2022-08-24 DIAGNOSIS — I4891 Unspecified atrial fibrillation: Secondary | ICD-10-CM | POA: Diagnosis not present

## 2022-08-27 DIAGNOSIS — I129 Hypertensive chronic kidney disease with stage 1 through stage 4 chronic kidney disease, or unspecified chronic kidney disease: Secondary | ICD-10-CM | POA: Diagnosis not present

## 2022-08-27 DIAGNOSIS — Z96612 Presence of left artificial shoulder joint: Secondary | ICD-10-CM | POA: Diagnosis not present

## 2022-08-27 DIAGNOSIS — J9601 Acute respiratory failure with hypoxia: Secondary | ICD-10-CM | POA: Diagnosis not present

## 2022-08-27 DIAGNOSIS — Z9981 Dependence on supplemental oxygen: Secondary | ICD-10-CM | POA: Diagnosis not present

## 2022-08-27 DIAGNOSIS — Z9181 History of falling: Secondary | ICD-10-CM | POA: Diagnosis not present

## 2022-08-27 DIAGNOSIS — J986 Disorders of diaphragm: Secondary | ICD-10-CM | POA: Diagnosis not present

## 2022-08-27 DIAGNOSIS — Z87891 Personal history of nicotine dependence: Secondary | ICD-10-CM | POA: Diagnosis not present

## 2022-08-27 DIAGNOSIS — Z7901 Long term (current) use of anticoagulants: Secondary | ICD-10-CM | POA: Diagnosis not present

## 2022-08-27 DIAGNOSIS — Z96611 Presence of right artificial shoulder joint: Secondary | ICD-10-CM | POA: Diagnosis not present

## 2022-08-27 DIAGNOSIS — N183 Chronic kidney disease, stage 3 unspecified: Secondary | ICD-10-CM | POA: Diagnosis not present

## 2022-08-27 DIAGNOSIS — H3530 Unspecified macular degeneration: Secondary | ICD-10-CM | POA: Diagnosis not present

## 2022-08-27 DIAGNOSIS — K5900 Constipation, unspecified: Secondary | ICD-10-CM | POA: Diagnosis not present

## 2022-08-27 DIAGNOSIS — I4891 Unspecified atrial fibrillation: Secondary | ICD-10-CM | POA: Diagnosis not present

## 2022-08-27 DIAGNOSIS — M1990 Unspecified osteoarthritis, unspecified site: Secondary | ICD-10-CM | POA: Diagnosis not present

## 2022-08-27 DIAGNOSIS — G629 Polyneuropathy, unspecified: Secondary | ICD-10-CM | POA: Diagnosis not present

## 2022-08-27 DIAGNOSIS — H409 Unspecified glaucoma: Secondary | ICD-10-CM | POA: Diagnosis not present

## 2022-08-27 DIAGNOSIS — E785 Hyperlipidemia, unspecified: Secondary | ICD-10-CM | POA: Diagnosis not present

## 2022-08-29 ENCOUNTER — Encounter: Payer: Self-pay | Admitting: Cardiology

## 2022-08-30 DIAGNOSIS — Z96612 Presence of left artificial shoulder joint: Secondary | ICD-10-CM | POA: Diagnosis not present

## 2022-08-30 DIAGNOSIS — H3530 Unspecified macular degeneration: Secondary | ICD-10-CM | POA: Diagnosis not present

## 2022-08-30 DIAGNOSIS — J986 Disorders of diaphragm: Secondary | ICD-10-CM | POA: Diagnosis not present

## 2022-08-30 DIAGNOSIS — Z9181 History of falling: Secondary | ICD-10-CM | POA: Diagnosis not present

## 2022-08-30 DIAGNOSIS — Z96611 Presence of right artificial shoulder joint: Secondary | ICD-10-CM | POA: Diagnosis not present

## 2022-08-30 DIAGNOSIS — H409 Unspecified glaucoma: Secondary | ICD-10-CM | POA: Diagnosis not present

## 2022-08-30 DIAGNOSIS — Z7901 Long term (current) use of anticoagulants: Secondary | ICD-10-CM | POA: Diagnosis not present

## 2022-08-30 DIAGNOSIS — Z9981 Dependence on supplemental oxygen: Secondary | ICD-10-CM | POA: Diagnosis not present

## 2022-08-30 DIAGNOSIS — I4891 Unspecified atrial fibrillation: Secondary | ICD-10-CM | POA: Diagnosis not present

## 2022-08-30 DIAGNOSIS — I129 Hypertensive chronic kidney disease with stage 1 through stage 4 chronic kidney disease, or unspecified chronic kidney disease: Secondary | ICD-10-CM | POA: Diagnosis not present

## 2022-08-30 DIAGNOSIS — E785 Hyperlipidemia, unspecified: Secondary | ICD-10-CM | POA: Diagnosis not present

## 2022-08-30 DIAGNOSIS — M1990 Unspecified osteoarthritis, unspecified site: Secondary | ICD-10-CM | POA: Diagnosis not present

## 2022-08-30 DIAGNOSIS — G629 Polyneuropathy, unspecified: Secondary | ICD-10-CM | POA: Diagnosis not present

## 2022-08-30 DIAGNOSIS — K5900 Constipation, unspecified: Secondary | ICD-10-CM | POA: Diagnosis not present

## 2022-08-30 DIAGNOSIS — N183 Chronic kidney disease, stage 3 unspecified: Secondary | ICD-10-CM | POA: Diagnosis not present

## 2022-08-30 DIAGNOSIS — J9601 Acute respiratory failure with hypoxia: Secondary | ICD-10-CM | POA: Diagnosis not present

## 2022-08-30 DIAGNOSIS — Z87891 Personal history of nicotine dependence: Secondary | ICD-10-CM | POA: Diagnosis not present

## 2022-08-31 DIAGNOSIS — M7989 Other specified soft tissue disorders: Secondary | ICD-10-CM | POA: Diagnosis not present

## 2022-08-31 DIAGNOSIS — I129 Hypertensive chronic kidney disease with stage 1 through stage 4 chronic kidney disease, or unspecified chronic kidney disease: Secondary | ICD-10-CM | POA: Diagnosis not present

## 2022-08-31 DIAGNOSIS — N182 Chronic kidney disease, stage 2 (mild): Secondary | ICD-10-CM | POA: Diagnosis not present

## 2022-08-31 DIAGNOSIS — I4891 Unspecified atrial fibrillation: Secondary | ICD-10-CM | POA: Diagnosis not present

## 2022-09-01 DIAGNOSIS — I129 Hypertensive chronic kidney disease with stage 1 through stage 4 chronic kidney disease, or unspecified chronic kidney disease: Secondary | ICD-10-CM | POA: Diagnosis not present

## 2022-09-01 DIAGNOSIS — G629 Polyneuropathy, unspecified: Secondary | ICD-10-CM | POA: Diagnosis not present

## 2022-09-01 DIAGNOSIS — Z9981 Dependence on supplemental oxygen: Secondary | ICD-10-CM | POA: Diagnosis not present

## 2022-09-01 DIAGNOSIS — H409 Unspecified glaucoma: Secondary | ICD-10-CM | POA: Diagnosis not present

## 2022-09-01 DIAGNOSIS — E785 Hyperlipidemia, unspecified: Secondary | ICD-10-CM | POA: Diagnosis not present

## 2022-09-01 DIAGNOSIS — J9601 Acute respiratory failure with hypoxia: Secondary | ICD-10-CM | POA: Diagnosis not present

## 2022-09-01 DIAGNOSIS — I4891 Unspecified atrial fibrillation: Secondary | ICD-10-CM | POA: Diagnosis not present

## 2022-09-01 DIAGNOSIS — Z96612 Presence of left artificial shoulder joint: Secondary | ICD-10-CM | POA: Diagnosis not present

## 2022-09-01 DIAGNOSIS — Z87891 Personal history of nicotine dependence: Secondary | ICD-10-CM | POA: Diagnosis not present

## 2022-09-01 DIAGNOSIS — K5900 Constipation, unspecified: Secondary | ICD-10-CM | POA: Diagnosis not present

## 2022-09-01 DIAGNOSIS — H3530 Unspecified macular degeneration: Secondary | ICD-10-CM | POA: Diagnosis not present

## 2022-09-01 DIAGNOSIS — Z7901 Long term (current) use of anticoagulants: Secondary | ICD-10-CM | POA: Diagnosis not present

## 2022-09-01 DIAGNOSIS — J986 Disorders of diaphragm: Secondary | ICD-10-CM | POA: Diagnosis not present

## 2022-09-01 DIAGNOSIS — Z96611 Presence of right artificial shoulder joint: Secondary | ICD-10-CM | POA: Diagnosis not present

## 2022-09-01 DIAGNOSIS — M1990 Unspecified osteoarthritis, unspecified site: Secondary | ICD-10-CM | POA: Diagnosis not present

## 2022-09-01 DIAGNOSIS — Z9181 History of falling: Secondary | ICD-10-CM | POA: Diagnosis not present

## 2022-09-01 DIAGNOSIS — N183 Chronic kidney disease, stage 3 unspecified: Secondary | ICD-10-CM | POA: Diagnosis not present

## 2022-09-06 DIAGNOSIS — G629 Polyneuropathy, unspecified: Secondary | ICD-10-CM | POA: Diagnosis not present

## 2022-09-06 DIAGNOSIS — H3530 Unspecified macular degeneration: Secondary | ICD-10-CM | POA: Diagnosis not present

## 2022-09-06 DIAGNOSIS — E785 Hyperlipidemia, unspecified: Secondary | ICD-10-CM | POA: Diagnosis not present

## 2022-09-06 DIAGNOSIS — Z7901 Long term (current) use of anticoagulants: Secondary | ICD-10-CM | POA: Diagnosis not present

## 2022-09-06 DIAGNOSIS — Z9181 History of falling: Secondary | ICD-10-CM | POA: Diagnosis not present

## 2022-09-06 DIAGNOSIS — Z9981 Dependence on supplemental oxygen: Secondary | ICD-10-CM | POA: Diagnosis not present

## 2022-09-06 DIAGNOSIS — K5900 Constipation, unspecified: Secondary | ICD-10-CM | POA: Diagnosis not present

## 2022-09-06 DIAGNOSIS — I4891 Unspecified atrial fibrillation: Secondary | ICD-10-CM | POA: Diagnosis not present

## 2022-09-06 DIAGNOSIS — J986 Disorders of diaphragm: Secondary | ICD-10-CM | POA: Diagnosis not present

## 2022-09-06 DIAGNOSIS — H409 Unspecified glaucoma: Secondary | ICD-10-CM | POA: Diagnosis not present

## 2022-09-06 DIAGNOSIS — J9601 Acute respiratory failure with hypoxia: Secondary | ICD-10-CM | POA: Diagnosis not present

## 2022-09-06 DIAGNOSIS — I129 Hypertensive chronic kidney disease with stage 1 through stage 4 chronic kidney disease, or unspecified chronic kidney disease: Secondary | ICD-10-CM | POA: Diagnosis not present

## 2022-09-06 DIAGNOSIS — Z96612 Presence of left artificial shoulder joint: Secondary | ICD-10-CM | POA: Diagnosis not present

## 2022-09-06 DIAGNOSIS — N183 Chronic kidney disease, stage 3 unspecified: Secondary | ICD-10-CM | POA: Diagnosis not present

## 2022-09-06 DIAGNOSIS — M1990 Unspecified osteoarthritis, unspecified site: Secondary | ICD-10-CM | POA: Diagnosis not present

## 2022-09-06 DIAGNOSIS — Z87891 Personal history of nicotine dependence: Secondary | ICD-10-CM | POA: Diagnosis not present

## 2022-09-06 DIAGNOSIS — Z96611 Presence of right artificial shoulder joint: Secondary | ICD-10-CM | POA: Diagnosis not present

## 2022-09-07 DIAGNOSIS — Z9181 History of falling: Secondary | ICD-10-CM | POA: Diagnosis not present

## 2022-09-07 DIAGNOSIS — G629 Polyneuropathy, unspecified: Secondary | ICD-10-CM | POA: Diagnosis not present

## 2022-09-07 DIAGNOSIS — N183 Chronic kidney disease, stage 3 unspecified: Secondary | ICD-10-CM | POA: Diagnosis not present

## 2022-09-07 DIAGNOSIS — M1990 Unspecified osteoarthritis, unspecified site: Secondary | ICD-10-CM | POA: Diagnosis not present

## 2022-09-07 DIAGNOSIS — I129 Hypertensive chronic kidney disease with stage 1 through stage 4 chronic kidney disease, or unspecified chronic kidney disease: Secondary | ICD-10-CM | POA: Diagnosis not present

## 2022-09-07 DIAGNOSIS — H3530 Unspecified macular degeneration: Secondary | ICD-10-CM | POA: Diagnosis not present

## 2022-09-07 DIAGNOSIS — Z87891 Personal history of nicotine dependence: Secondary | ICD-10-CM | POA: Diagnosis not present

## 2022-09-07 DIAGNOSIS — J986 Disorders of diaphragm: Secondary | ICD-10-CM | POA: Diagnosis not present

## 2022-09-07 DIAGNOSIS — Z9981 Dependence on supplemental oxygen: Secondary | ICD-10-CM | POA: Diagnosis not present

## 2022-09-07 DIAGNOSIS — Z7901 Long term (current) use of anticoagulants: Secondary | ICD-10-CM | POA: Diagnosis not present

## 2022-09-07 DIAGNOSIS — I4891 Unspecified atrial fibrillation: Secondary | ICD-10-CM | POA: Diagnosis not present

## 2022-09-07 DIAGNOSIS — K5900 Constipation, unspecified: Secondary | ICD-10-CM | POA: Diagnosis not present

## 2022-09-07 DIAGNOSIS — Z96611 Presence of right artificial shoulder joint: Secondary | ICD-10-CM | POA: Diagnosis not present

## 2022-09-07 DIAGNOSIS — J9601 Acute respiratory failure with hypoxia: Secondary | ICD-10-CM | POA: Diagnosis not present

## 2022-09-07 DIAGNOSIS — Z96612 Presence of left artificial shoulder joint: Secondary | ICD-10-CM | POA: Diagnosis not present

## 2022-09-07 DIAGNOSIS — E785 Hyperlipidemia, unspecified: Secondary | ICD-10-CM | POA: Diagnosis not present

## 2022-09-07 DIAGNOSIS — H409 Unspecified glaucoma: Secondary | ICD-10-CM | POA: Diagnosis not present

## 2022-09-08 DIAGNOSIS — M6281 Muscle weakness (generalized): Secondary | ICD-10-CM | POA: Diagnosis not present

## 2022-09-08 DIAGNOSIS — Z9181 History of falling: Secondary | ICD-10-CM | POA: Diagnosis not present

## 2022-09-08 DIAGNOSIS — Z9981 Dependence on supplemental oxygen: Secondary | ICD-10-CM | POA: Diagnosis not present

## 2022-09-08 DIAGNOSIS — J9601 Acute respiratory failure with hypoxia: Secondary | ICD-10-CM | POA: Diagnosis not present

## 2022-09-09 DIAGNOSIS — Z9981 Dependence on supplemental oxygen: Secondary | ICD-10-CM | POA: Diagnosis not present

## 2022-09-09 DIAGNOSIS — I129 Hypertensive chronic kidney disease with stage 1 through stage 4 chronic kidney disease, or unspecified chronic kidney disease: Secondary | ICD-10-CM | POA: Diagnosis not present

## 2022-09-09 DIAGNOSIS — Z87891 Personal history of nicotine dependence: Secondary | ICD-10-CM | POA: Diagnosis not present

## 2022-09-09 DIAGNOSIS — Z7901 Long term (current) use of anticoagulants: Secondary | ICD-10-CM | POA: Diagnosis not present

## 2022-09-09 DIAGNOSIS — J9601 Acute respiratory failure with hypoxia: Secondary | ICD-10-CM | POA: Diagnosis not present

## 2022-09-09 DIAGNOSIS — J986 Disorders of diaphragm: Secondary | ICD-10-CM | POA: Diagnosis not present

## 2022-09-09 DIAGNOSIS — K5900 Constipation, unspecified: Secondary | ICD-10-CM | POA: Diagnosis not present

## 2022-09-09 DIAGNOSIS — Z96611 Presence of right artificial shoulder joint: Secondary | ICD-10-CM | POA: Diagnosis not present

## 2022-09-09 DIAGNOSIS — E785 Hyperlipidemia, unspecified: Secondary | ICD-10-CM | POA: Diagnosis not present

## 2022-09-09 DIAGNOSIS — I4891 Unspecified atrial fibrillation: Secondary | ICD-10-CM | POA: Diagnosis not present

## 2022-09-09 DIAGNOSIS — M1990 Unspecified osteoarthritis, unspecified site: Secondary | ICD-10-CM | POA: Diagnosis not present

## 2022-09-09 DIAGNOSIS — G629 Polyneuropathy, unspecified: Secondary | ICD-10-CM | POA: Diagnosis not present

## 2022-09-09 DIAGNOSIS — H409 Unspecified glaucoma: Secondary | ICD-10-CM | POA: Diagnosis not present

## 2022-09-09 DIAGNOSIS — H3530 Unspecified macular degeneration: Secondary | ICD-10-CM | POA: Diagnosis not present

## 2022-09-09 DIAGNOSIS — Z9181 History of falling: Secondary | ICD-10-CM | POA: Diagnosis not present

## 2022-09-09 DIAGNOSIS — Z96612 Presence of left artificial shoulder joint: Secondary | ICD-10-CM | POA: Diagnosis not present

## 2022-09-09 DIAGNOSIS — N183 Chronic kidney disease, stage 3 unspecified: Secondary | ICD-10-CM | POA: Diagnosis not present

## 2022-09-10 DIAGNOSIS — I150 Renovascular hypertension: Secondary | ICD-10-CM | POA: Diagnosis not present

## 2022-09-10 DIAGNOSIS — I129 Hypertensive chronic kidney disease with stage 1 through stage 4 chronic kidney disease, or unspecified chronic kidney disease: Secondary | ICD-10-CM | POA: Diagnosis not present

## 2022-09-10 DIAGNOSIS — I4891 Unspecified atrial fibrillation: Secondary | ICD-10-CM | POA: Diagnosis not present

## 2022-09-10 DIAGNOSIS — I82409 Acute embolism and thrombosis of unspecified deep veins of unspecified lower extremity: Secondary | ICD-10-CM | POA: Diagnosis not present

## 2022-09-10 DIAGNOSIS — J22 Unspecified acute lower respiratory infection: Secondary | ICD-10-CM | POA: Diagnosis not present

## 2022-09-10 DIAGNOSIS — R609 Edema, unspecified: Secondary | ICD-10-CM | POA: Diagnosis not present

## 2022-09-10 DIAGNOSIS — E782 Mixed hyperlipidemia: Secondary | ICD-10-CM | POA: Diagnosis not present

## 2022-09-10 DIAGNOSIS — R5381 Other malaise: Secondary | ICD-10-CM | POA: Diagnosis not present

## 2022-09-10 DIAGNOSIS — N189 Chronic kidney disease, unspecified: Secondary | ICD-10-CM | POA: Diagnosis not present

## 2022-09-10 DIAGNOSIS — M159 Polyosteoarthritis, unspecified: Secondary | ICD-10-CM | POA: Diagnosis not present

## 2022-09-10 DIAGNOSIS — H409 Unspecified glaucoma: Secondary | ICD-10-CM | POA: Diagnosis not present

## 2022-09-10 DIAGNOSIS — I493 Ventricular premature depolarization: Secondary | ICD-10-CM | POA: Diagnosis not present

## 2022-09-14 DIAGNOSIS — J986 Disorders of diaphragm: Secondary | ICD-10-CM | POA: Diagnosis not present

## 2022-09-14 DIAGNOSIS — H3530 Unspecified macular degeneration: Secondary | ICD-10-CM | POA: Diagnosis not present

## 2022-09-14 DIAGNOSIS — H409 Unspecified glaucoma: Secondary | ICD-10-CM | POA: Diagnosis not present

## 2022-09-14 DIAGNOSIS — G629 Polyneuropathy, unspecified: Secondary | ICD-10-CM | POA: Diagnosis not present

## 2022-09-14 DIAGNOSIS — Z87891 Personal history of nicotine dependence: Secondary | ICD-10-CM | POA: Diagnosis not present

## 2022-09-14 DIAGNOSIS — Z96611 Presence of right artificial shoulder joint: Secondary | ICD-10-CM | POA: Diagnosis not present

## 2022-09-14 DIAGNOSIS — N183 Chronic kidney disease, stage 3 unspecified: Secondary | ICD-10-CM | POA: Diagnosis not present

## 2022-09-14 DIAGNOSIS — I129 Hypertensive chronic kidney disease with stage 1 through stage 4 chronic kidney disease, or unspecified chronic kidney disease: Secondary | ICD-10-CM | POA: Diagnosis not present

## 2022-09-14 DIAGNOSIS — Z9981 Dependence on supplemental oxygen: Secondary | ICD-10-CM | POA: Diagnosis not present

## 2022-09-14 DIAGNOSIS — K5900 Constipation, unspecified: Secondary | ICD-10-CM | POA: Diagnosis not present

## 2022-09-14 DIAGNOSIS — M1990 Unspecified osteoarthritis, unspecified site: Secondary | ICD-10-CM | POA: Diagnosis not present

## 2022-09-14 DIAGNOSIS — Z7901 Long term (current) use of anticoagulants: Secondary | ICD-10-CM | POA: Diagnosis not present

## 2022-09-14 DIAGNOSIS — J9601 Acute respiratory failure with hypoxia: Secondary | ICD-10-CM | POA: Diagnosis not present

## 2022-09-14 DIAGNOSIS — I4891 Unspecified atrial fibrillation: Secondary | ICD-10-CM | POA: Diagnosis not present

## 2022-09-14 DIAGNOSIS — Z9181 History of falling: Secondary | ICD-10-CM | POA: Diagnosis not present

## 2022-09-14 DIAGNOSIS — E785 Hyperlipidemia, unspecified: Secondary | ICD-10-CM | POA: Diagnosis not present

## 2022-09-14 DIAGNOSIS — Z96612 Presence of left artificial shoulder joint: Secondary | ICD-10-CM | POA: Diagnosis not present

## 2022-09-15 ENCOUNTER — Encounter: Payer: Self-pay | Admitting: *Deleted

## 2022-09-15 ENCOUNTER — Telehealth: Payer: Self-pay | Admitting: *Deleted

## 2022-09-15 DIAGNOSIS — J986 Disorders of diaphragm: Secondary | ICD-10-CM | POA: Diagnosis not present

## 2022-09-15 DIAGNOSIS — I4891 Unspecified atrial fibrillation: Secondary | ICD-10-CM | POA: Diagnosis not present

## 2022-09-15 DIAGNOSIS — N183 Chronic kidney disease, stage 3 unspecified: Secondary | ICD-10-CM | POA: Diagnosis not present

## 2022-09-15 DIAGNOSIS — I129 Hypertensive chronic kidney disease with stage 1 through stage 4 chronic kidney disease, or unspecified chronic kidney disease: Secondary | ICD-10-CM | POA: Diagnosis not present

## 2022-09-15 DIAGNOSIS — J9601 Acute respiratory failure with hypoxia: Secondary | ICD-10-CM | POA: Diagnosis not present

## 2022-09-15 DIAGNOSIS — K5900 Constipation, unspecified: Secondary | ICD-10-CM | POA: Diagnosis not present

## 2022-09-15 DIAGNOSIS — E785 Hyperlipidemia, unspecified: Secondary | ICD-10-CM | POA: Diagnosis not present

## 2022-09-15 DIAGNOSIS — Z96611 Presence of right artificial shoulder joint: Secondary | ICD-10-CM | POA: Diagnosis not present

## 2022-09-15 DIAGNOSIS — Z87891 Personal history of nicotine dependence: Secondary | ICD-10-CM | POA: Diagnosis not present

## 2022-09-15 DIAGNOSIS — Z7901 Long term (current) use of anticoagulants: Secondary | ICD-10-CM | POA: Diagnosis not present

## 2022-09-15 DIAGNOSIS — H409 Unspecified glaucoma: Secondary | ICD-10-CM | POA: Diagnosis not present

## 2022-09-15 DIAGNOSIS — M1990 Unspecified osteoarthritis, unspecified site: Secondary | ICD-10-CM | POA: Diagnosis not present

## 2022-09-15 DIAGNOSIS — Z96612 Presence of left artificial shoulder joint: Secondary | ICD-10-CM | POA: Diagnosis not present

## 2022-09-15 DIAGNOSIS — Z9181 History of falling: Secondary | ICD-10-CM | POA: Diagnosis not present

## 2022-09-15 DIAGNOSIS — Z9981 Dependence on supplemental oxygen: Secondary | ICD-10-CM | POA: Diagnosis not present

## 2022-09-15 DIAGNOSIS — G629 Polyneuropathy, unspecified: Secondary | ICD-10-CM | POA: Diagnosis not present

## 2022-09-15 DIAGNOSIS — H3530 Unspecified macular degeneration: Secondary | ICD-10-CM | POA: Diagnosis not present

## 2022-09-15 NOTE — Patient Outreach (Signed)
  Care Coordination   Initial Visit Note   09/15/2022 Name: Shane Ripberger Sr. MRN: [id number] DOB: 20-Apr-1925  Shane Zaccone Sr. is a 88 y.o. year old male who sees Aura Dials, MD for primary care. I spoke with  Shane Eth Sr. by phone today.  What matters to the patients health and wellness today?  No needs    Goals Addressed               This Visit's Progress     COMPLETED: No needs (pt-stated)        Care Coordination Interventions: Advised patient to scheduled his AWV for this year Reviewed medications with patient and discussed adherence to all medication with no needed refills Reviewed scheduled/upcoming provider appointments including pending appointments  Screening for signs and symptoms of depression related to chronic disease state  Assessed social determinant of health barriers             SDOH assessments and interventions completed:  Yes  SDOH Interventions Today    Flowsheet Row Most Recent Value  SDOH Interventions   Food Insecurity Interventions Intervention Not Indicated  Housing Interventions Intervention Not Indicated  Transportation Interventions Intervention Not Indicated  Utilities Interventions Intervention Not Indicated        Care Coordination Interventions Activated:  Yes  Care Coordination Interventions:  Yes, provided   Follow up plan: No further intervention required.   Encounter Outcome:  Pt. Visit Completed   Shane Mina, RN Care Management Coordinator [place] Office [phone]

## 2022-09-15 NOTE — Patient Instructions (Signed)
Visit Information  Thank you for taking time to visit with me today. Please don't hesitate to contact me if I can be of assistance to you.   Following are the goals we discussed today:   Goals Addressed               This Visit's Progress     COMPLETED: No needs (pt-stated)        Care Coordination Interventions: Advised patient to scheduled his AWV for this year Reviewed medications with patient and discussed adherence to all medication with no needed refills Reviewed scheduled/upcoming provider appointments including pending appointments  Screening for signs and symptoms of depression related to chronic disease state  Assessed social determinant of health barriers             Please call the care guide team at [phone] if you need to cancel or reschedule your appointment.   If you are experiencing a Mental Health or [place] or need someone to talk to, please call the Suicide and Crisis Lifeline: 988  Patient verbalizes understanding of instructions and care plan provided today and agrees to view in [place]. Active MyChart status and patient understanding of how to access instructions and care plan via MyChart confirmed with patient.     No follow interventions needed at this time  Raina Mina, RN Care Management Coordinator [place] Office [phone]

## 2022-09-20 DIAGNOSIS — I129 Hypertensive chronic kidney disease with stage 1 through stage 4 chronic kidney disease, or unspecified chronic kidney disease: Secondary | ICD-10-CM | POA: Diagnosis not present

## 2022-09-20 DIAGNOSIS — Z96612 Presence of left artificial shoulder joint: Secondary | ICD-10-CM | POA: Diagnosis not present

## 2022-09-20 DIAGNOSIS — Z87891 Personal history of nicotine dependence: Secondary | ICD-10-CM | POA: Diagnosis not present

## 2022-09-20 DIAGNOSIS — H3530 Unspecified macular degeneration: Secondary | ICD-10-CM | POA: Diagnosis not present

## 2022-09-20 DIAGNOSIS — H409 Unspecified glaucoma: Secondary | ICD-10-CM | POA: Diagnosis not present

## 2022-09-20 DIAGNOSIS — M1990 Unspecified osteoarthritis, unspecified site: Secondary | ICD-10-CM | POA: Diagnosis not present

## 2022-09-20 DIAGNOSIS — Z96611 Presence of right artificial shoulder joint: Secondary | ICD-10-CM | POA: Diagnosis not present

## 2022-09-20 DIAGNOSIS — I4891 Unspecified atrial fibrillation: Secondary | ICD-10-CM | POA: Diagnosis not present

## 2022-09-20 DIAGNOSIS — J986 Disorders of diaphragm: Secondary | ICD-10-CM | POA: Diagnosis not present

## 2022-09-20 DIAGNOSIS — E785 Hyperlipidemia, unspecified: Secondary | ICD-10-CM | POA: Diagnosis not present

## 2022-09-20 DIAGNOSIS — N183 Chronic kidney disease, stage 3 unspecified: Secondary | ICD-10-CM | POA: Diagnosis not present

## 2022-09-20 DIAGNOSIS — Z9181 History of falling: Secondary | ICD-10-CM | POA: Diagnosis not present

## 2022-09-20 DIAGNOSIS — J9601 Acute respiratory failure with hypoxia: Secondary | ICD-10-CM | POA: Diagnosis not present

## 2022-09-20 DIAGNOSIS — Z9981 Dependence on supplemental oxygen: Secondary | ICD-10-CM | POA: Diagnosis not present

## 2022-09-20 DIAGNOSIS — G629 Polyneuropathy, unspecified: Secondary | ICD-10-CM | POA: Diagnosis not present

## 2022-09-20 DIAGNOSIS — K5900 Constipation, unspecified: Secondary | ICD-10-CM | POA: Diagnosis not present

## 2022-09-20 DIAGNOSIS — Z7901 Long term (current) use of anticoagulants: Secondary | ICD-10-CM | POA: Diagnosis not present

## 2022-09-21 DIAGNOSIS — I129 Hypertensive chronic kidney disease with stage 1 through stage 4 chronic kidney disease, or unspecified chronic kidney disease: Secondary | ICD-10-CM | POA: Diagnosis not present

## 2022-09-21 DIAGNOSIS — G629 Polyneuropathy, unspecified: Secondary | ICD-10-CM | POA: Diagnosis not present

## 2022-09-21 DIAGNOSIS — Z9181 History of falling: Secondary | ICD-10-CM | POA: Diagnosis not present

## 2022-09-21 DIAGNOSIS — Z96612 Presence of left artificial shoulder joint: Secondary | ICD-10-CM | POA: Diagnosis not present

## 2022-09-21 DIAGNOSIS — Z9981 Dependence on supplemental oxygen: Secondary | ICD-10-CM | POA: Diagnosis not present

## 2022-09-21 DIAGNOSIS — M1990 Unspecified osteoarthritis, unspecified site: Secondary | ICD-10-CM | POA: Diagnosis not present

## 2022-09-21 DIAGNOSIS — H3530 Unspecified macular degeneration: Secondary | ICD-10-CM | POA: Diagnosis not present

## 2022-09-21 DIAGNOSIS — N183 Chronic kidney disease, stage 3 unspecified: Secondary | ICD-10-CM | POA: Diagnosis not present

## 2022-09-21 DIAGNOSIS — E785 Hyperlipidemia, unspecified: Secondary | ICD-10-CM | POA: Diagnosis not present

## 2022-09-21 DIAGNOSIS — Z7901 Long term (current) use of anticoagulants: Secondary | ICD-10-CM | POA: Diagnosis not present

## 2022-09-21 DIAGNOSIS — J9601 Acute respiratory failure with hypoxia: Secondary | ICD-10-CM | POA: Diagnosis not present

## 2022-09-21 DIAGNOSIS — Z87891 Personal history of nicotine dependence: Secondary | ICD-10-CM | POA: Diagnosis not present

## 2022-09-21 DIAGNOSIS — I4891 Unspecified atrial fibrillation: Secondary | ICD-10-CM | POA: Diagnosis not present

## 2022-09-21 DIAGNOSIS — H409 Unspecified glaucoma: Secondary | ICD-10-CM | POA: Diagnosis not present

## 2022-09-21 DIAGNOSIS — Z96611 Presence of right artificial shoulder joint: Secondary | ICD-10-CM | POA: Diagnosis not present

## 2022-09-21 DIAGNOSIS — K5900 Constipation, unspecified: Secondary | ICD-10-CM | POA: Diagnosis not present

## 2022-09-21 DIAGNOSIS — J986 Disorders of diaphragm: Secondary | ICD-10-CM | POA: Diagnosis not present

## 2022-09-21 DIAGNOSIS — R600 Localized edema: Secondary | ICD-10-CM | POA: Diagnosis not present

## 2022-09-28 DIAGNOSIS — M7989 Other specified soft tissue disorders: Secondary | ICD-10-CM | POA: Diagnosis not present

## 2022-09-28 DIAGNOSIS — N182 Chronic kidney disease, stage 2 (mild): Secondary | ICD-10-CM | POA: Diagnosis not present

## 2022-09-28 DIAGNOSIS — I4891 Unspecified atrial fibrillation: Secondary | ICD-10-CM | POA: Diagnosis not present

## 2022-09-28 DIAGNOSIS — Z96611 Presence of right artificial shoulder joint: Secondary | ICD-10-CM | POA: Diagnosis not present

## 2022-09-28 DIAGNOSIS — E785 Hyperlipidemia, unspecified: Secondary | ICD-10-CM | POA: Diagnosis not present

## 2022-09-28 DIAGNOSIS — M1990 Unspecified osteoarthritis, unspecified site: Secondary | ICD-10-CM | POA: Diagnosis not present

## 2022-09-28 DIAGNOSIS — K5900 Constipation, unspecified: Secondary | ICD-10-CM | POA: Diagnosis not present

## 2022-09-28 DIAGNOSIS — J9601 Acute respiratory failure with hypoxia: Secondary | ICD-10-CM | POA: Diagnosis not present

## 2022-09-28 DIAGNOSIS — Z87891 Personal history of nicotine dependence: Secondary | ICD-10-CM | POA: Diagnosis not present

## 2022-09-28 DIAGNOSIS — H409 Unspecified glaucoma: Secondary | ICD-10-CM | POA: Diagnosis not present

## 2022-09-28 DIAGNOSIS — Z9981 Dependence on supplemental oxygen: Secondary | ICD-10-CM | POA: Diagnosis not present

## 2022-09-28 DIAGNOSIS — Z9181 History of falling: Secondary | ICD-10-CM | POA: Diagnosis not present

## 2022-09-28 DIAGNOSIS — I129 Hypertensive chronic kidney disease with stage 1 through stage 4 chronic kidney disease, or unspecified chronic kidney disease: Secondary | ICD-10-CM | POA: Diagnosis not present

## 2022-09-28 DIAGNOSIS — Z7901 Long term (current) use of anticoagulants: Secondary | ICD-10-CM | POA: Diagnosis not present

## 2022-09-28 DIAGNOSIS — Z96612 Presence of left artificial shoulder joint: Secondary | ICD-10-CM | POA: Diagnosis not present

## 2022-09-28 DIAGNOSIS — J986 Disorders of diaphragm: Secondary | ICD-10-CM | POA: Diagnosis not present

## 2022-09-28 DIAGNOSIS — G629 Polyneuropathy, unspecified: Secondary | ICD-10-CM | POA: Diagnosis not present

## 2022-09-28 DIAGNOSIS — H3530 Unspecified macular degeneration: Secondary | ICD-10-CM | POA: Diagnosis not present

## 2022-09-28 DIAGNOSIS — N183 Chronic kidney disease, stage 3 unspecified: Secondary | ICD-10-CM | POA: Diagnosis not present

## 2022-09-29 DIAGNOSIS — Z9981 Dependence on supplemental oxygen: Secondary | ICD-10-CM | POA: Diagnosis not present

## 2022-09-29 DIAGNOSIS — M1990 Unspecified osteoarthritis, unspecified site: Secondary | ICD-10-CM | POA: Diagnosis not present

## 2022-09-29 DIAGNOSIS — Z7901 Long term (current) use of anticoagulants: Secondary | ICD-10-CM | POA: Diagnosis not present

## 2022-09-29 DIAGNOSIS — Z9181 History of falling: Secondary | ICD-10-CM | POA: Diagnosis not present

## 2022-09-29 DIAGNOSIS — Z87891 Personal history of nicotine dependence: Secondary | ICD-10-CM | POA: Diagnosis not present

## 2022-09-29 DIAGNOSIS — J9601 Acute respiratory failure with hypoxia: Secondary | ICD-10-CM | POA: Diagnosis not present

## 2022-09-29 DIAGNOSIS — G629 Polyneuropathy, unspecified: Secondary | ICD-10-CM | POA: Diagnosis not present

## 2022-09-29 DIAGNOSIS — Z96612 Presence of left artificial shoulder joint: Secondary | ICD-10-CM | POA: Diagnosis not present

## 2022-09-29 DIAGNOSIS — E785 Hyperlipidemia, unspecified: Secondary | ICD-10-CM | POA: Diagnosis not present

## 2022-09-29 DIAGNOSIS — J986 Disorders of diaphragm: Secondary | ICD-10-CM | POA: Diagnosis not present

## 2022-09-29 DIAGNOSIS — I4891 Unspecified atrial fibrillation: Secondary | ICD-10-CM | POA: Diagnosis not present

## 2022-09-29 DIAGNOSIS — H409 Unspecified glaucoma: Secondary | ICD-10-CM | POA: Diagnosis not present

## 2022-09-29 DIAGNOSIS — H3530 Unspecified macular degeneration: Secondary | ICD-10-CM | POA: Diagnosis not present

## 2022-09-29 DIAGNOSIS — K5900 Constipation, unspecified: Secondary | ICD-10-CM | POA: Diagnosis not present

## 2022-09-29 DIAGNOSIS — I129 Hypertensive chronic kidney disease with stage 1 through stage 4 chronic kidney disease, or unspecified chronic kidney disease: Secondary | ICD-10-CM | POA: Diagnosis not present

## 2022-09-29 DIAGNOSIS — Z96611 Presence of right artificial shoulder joint: Secondary | ICD-10-CM | POA: Diagnosis not present

## 2022-09-29 DIAGNOSIS — N183 Chronic kidney disease, stage 3 unspecified: Secondary | ICD-10-CM | POA: Diagnosis not present

## 2022-10-05 DIAGNOSIS — Z13228 Encounter for screening for other metabolic disorders: Secondary | ICD-10-CM | POA: Diagnosis not present

## 2022-10-06 DIAGNOSIS — J986 Disorders of diaphragm: Secondary | ICD-10-CM | POA: Diagnosis not present

## 2022-10-06 DIAGNOSIS — I4891 Unspecified atrial fibrillation: Secondary | ICD-10-CM | POA: Diagnosis not present

## 2022-10-06 DIAGNOSIS — H409 Unspecified glaucoma: Secondary | ICD-10-CM | POA: Diagnosis not present

## 2022-10-06 DIAGNOSIS — G629 Polyneuropathy, unspecified: Secondary | ICD-10-CM | POA: Diagnosis not present

## 2022-10-06 DIAGNOSIS — Z9181 History of falling: Secondary | ICD-10-CM | POA: Diagnosis not present

## 2022-10-06 DIAGNOSIS — Z7901 Long term (current) use of anticoagulants: Secondary | ICD-10-CM | POA: Diagnosis not present

## 2022-10-06 DIAGNOSIS — H3530 Unspecified macular degeneration: Secondary | ICD-10-CM | POA: Diagnosis not present

## 2022-10-06 DIAGNOSIS — Z96611 Presence of right artificial shoulder joint: Secondary | ICD-10-CM | POA: Diagnosis not present

## 2022-10-06 DIAGNOSIS — K5900 Constipation, unspecified: Secondary | ICD-10-CM | POA: Diagnosis not present

## 2022-10-06 DIAGNOSIS — I129 Hypertensive chronic kidney disease with stage 1 through stage 4 chronic kidney disease, or unspecified chronic kidney disease: Secondary | ICD-10-CM | POA: Diagnosis not present

## 2022-10-06 DIAGNOSIS — E785 Hyperlipidemia, unspecified: Secondary | ICD-10-CM | POA: Diagnosis not present

## 2022-10-06 DIAGNOSIS — Z96612 Presence of left artificial shoulder joint: Secondary | ICD-10-CM | POA: Diagnosis not present

## 2022-10-06 DIAGNOSIS — Z87891 Personal history of nicotine dependence: Secondary | ICD-10-CM | POA: Diagnosis not present

## 2022-10-06 DIAGNOSIS — N183 Chronic kidney disease, stage 3 unspecified: Secondary | ICD-10-CM | POA: Diagnosis not present

## 2022-10-06 DIAGNOSIS — J9601 Acute respiratory failure with hypoxia: Secondary | ICD-10-CM | POA: Diagnosis not present

## 2022-10-06 DIAGNOSIS — M1990 Unspecified osteoarthritis, unspecified site: Secondary | ICD-10-CM | POA: Diagnosis not present

## 2022-10-07 DIAGNOSIS — Z9181 History of falling: Secondary | ICD-10-CM | POA: Diagnosis not present

## 2022-10-07 DIAGNOSIS — K5900 Constipation, unspecified: Secondary | ICD-10-CM | POA: Diagnosis not present

## 2022-10-07 DIAGNOSIS — G629 Polyneuropathy, unspecified: Secondary | ICD-10-CM | POA: Diagnosis not present

## 2022-10-07 DIAGNOSIS — H4051X1 Glaucoma secondary to other eye disorders, right eye, mild stage: Secondary | ICD-10-CM | POA: Diagnosis not present

## 2022-10-07 DIAGNOSIS — N183 Chronic kidney disease, stage 3 unspecified: Secondary | ICD-10-CM | POA: Diagnosis not present

## 2022-10-07 DIAGNOSIS — I4891 Unspecified atrial fibrillation: Secondary | ICD-10-CM | POA: Diagnosis not present

## 2022-10-07 DIAGNOSIS — Z96611 Presence of right artificial shoulder joint: Secondary | ICD-10-CM | POA: Diagnosis not present

## 2022-10-07 DIAGNOSIS — E785 Hyperlipidemia, unspecified: Secondary | ICD-10-CM | POA: Diagnosis not present

## 2022-10-07 DIAGNOSIS — H353211 Exudative age-related macular degeneration, right eye, with active choroidal neovascularization: Secondary | ICD-10-CM | POA: Diagnosis not present

## 2022-10-07 DIAGNOSIS — I129 Hypertensive chronic kidney disease with stage 1 through stage 4 chronic kidney disease, or unspecified chronic kidney disease: Secondary | ICD-10-CM | POA: Diagnosis not present

## 2022-10-07 DIAGNOSIS — M1990 Unspecified osteoarthritis, unspecified site: Secondary | ICD-10-CM | POA: Diagnosis not present

## 2022-10-07 DIAGNOSIS — Z96612 Presence of left artificial shoulder joint: Secondary | ICD-10-CM | POA: Diagnosis not present

## 2022-10-07 DIAGNOSIS — H3530 Unspecified macular degeneration: Secondary | ICD-10-CM | POA: Diagnosis not present

## 2022-10-07 DIAGNOSIS — Z7901 Long term (current) use of anticoagulants: Secondary | ICD-10-CM | POA: Diagnosis not present

## 2022-10-07 DIAGNOSIS — H409 Unspecified glaucoma: Secondary | ICD-10-CM | POA: Diagnosis not present

## 2022-10-07 DIAGNOSIS — Z87891 Personal history of nicotine dependence: Secondary | ICD-10-CM | POA: Diagnosis not present

## 2022-10-07 DIAGNOSIS — J986 Disorders of diaphragm: Secondary | ICD-10-CM | POA: Diagnosis not present

## 2022-10-07 DIAGNOSIS — J9601 Acute respiratory failure with hypoxia: Secondary | ICD-10-CM | POA: Diagnosis not present

## 2022-10-07 DIAGNOSIS — H353122 Nonexudative age-related macular degeneration, left eye, intermediate dry stage: Secondary | ICD-10-CM | POA: Diagnosis not present

## 2022-10-08 DIAGNOSIS — N183 Chronic kidney disease, stage 3 unspecified: Secondary | ICD-10-CM | POA: Diagnosis not present

## 2022-10-08 DIAGNOSIS — J986 Disorders of diaphragm: Secondary | ICD-10-CM | POA: Diagnosis not present

## 2022-10-08 DIAGNOSIS — H3530 Unspecified macular degeneration: Secondary | ICD-10-CM | POA: Diagnosis not present

## 2022-10-08 DIAGNOSIS — E785 Hyperlipidemia, unspecified: Secondary | ICD-10-CM | POA: Diagnosis not present

## 2022-10-08 DIAGNOSIS — I4891 Unspecified atrial fibrillation: Secondary | ICD-10-CM | POA: Diagnosis not present

## 2022-10-08 DIAGNOSIS — H409 Unspecified glaucoma: Secondary | ICD-10-CM | POA: Diagnosis not present

## 2022-10-08 DIAGNOSIS — J9601 Acute respiratory failure with hypoxia: Secondary | ICD-10-CM | POA: Diagnosis not present

## 2022-10-08 DIAGNOSIS — Z7901 Long term (current) use of anticoagulants: Secondary | ICD-10-CM | POA: Diagnosis not present

## 2022-10-08 DIAGNOSIS — G629 Polyneuropathy, unspecified: Secondary | ICD-10-CM | POA: Diagnosis not present

## 2022-10-08 DIAGNOSIS — M1990 Unspecified osteoarthritis, unspecified site: Secondary | ICD-10-CM | POA: Diagnosis not present

## 2022-10-08 DIAGNOSIS — I129 Hypertensive chronic kidney disease with stage 1 through stage 4 chronic kidney disease, or unspecified chronic kidney disease: Secondary | ICD-10-CM | POA: Diagnosis not present

## 2022-10-08 DIAGNOSIS — Z96611 Presence of right artificial shoulder joint: Secondary | ICD-10-CM | POA: Diagnosis not present

## 2022-10-08 DIAGNOSIS — Z9181 History of falling: Secondary | ICD-10-CM | POA: Diagnosis not present

## 2022-10-08 DIAGNOSIS — K5900 Constipation, unspecified: Secondary | ICD-10-CM | POA: Diagnosis not present

## 2022-10-08 DIAGNOSIS — Z87891 Personal history of nicotine dependence: Secondary | ICD-10-CM | POA: Diagnosis not present

## 2022-10-08 DIAGNOSIS — Z96612 Presence of left artificial shoulder joint: Secondary | ICD-10-CM | POA: Diagnosis not present

## 2022-10-09 DIAGNOSIS — J9601 Acute respiratory failure with hypoxia: Secondary | ICD-10-CM | POA: Diagnosis not present

## 2022-10-09 DIAGNOSIS — Z9981 Dependence on supplemental oxygen: Secondary | ICD-10-CM | POA: Diagnosis not present

## 2022-10-09 DIAGNOSIS — Z9181 History of falling: Secondary | ICD-10-CM | POA: Diagnosis not present

## 2022-10-09 DIAGNOSIS — M6281 Muscle weakness (generalized): Secondary | ICD-10-CM | POA: Diagnosis not present

## 2022-10-13 DIAGNOSIS — J9601 Acute respiratory failure with hypoxia: Secondary | ICD-10-CM | POA: Diagnosis not present

## 2022-10-13 DIAGNOSIS — Z7901 Long term (current) use of anticoagulants: Secondary | ICD-10-CM | POA: Diagnosis not present

## 2022-10-13 DIAGNOSIS — M1990 Unspecified osteoarthritis, unspecified site: Secondary | ICD-10-CM | POA: Diagnosis not present

## 2022-10-13 DIAGNOSIS — Z9181 History of falling: Secondary | ICD-10-CM | POA: Diagnosis not present

## 2022-10-13 DIAGNOSIS — N183 Chronic kidney disease, stage 3 unspecified: Secondary | ICD-10-CM | POA: Diagnosis not present

## 2022-10-13 DIAGNOSIS — K5900 Constipation, unspecified: Secondary | ICD-10-CM | POA: Diagnosis not present

## 2022-10-13 DIAGNOSIS — G629 Polyneuropathy, unspecified: Secondary | ICD-10-CM | POA: Diagnosis not present

## 2022-10-13 DIAGNOSIS — Z87891 Personal history of nicotine dependence: Secondary | ICD-10-CM | POA: Diagnosis not present

## 2022-10-13 DIAGNOSIS — Z96611 Presence of right artificial shoulder joint: Secondary | ICD-10-CM | POA: Diagnosis not present

## 2022-10-13 DIAGNOSIS — Z96612 Presence of left artificial shoulder joint: Secondary | ICD-10-CM | POA: Diagnosis not present

## 2022-10-13 DIAGNOSIS — I129 Hypertensive chronic kidney disease with stage 1 through stage 4 chronic kidney disease, or unspecified chronic kidney disease: Secondary | ICD-10-CM | POA: Diagnosis not present

## 2022-10-13 DIAGNOSIS — H3530 Unspecified macular degeneration: Secondary | ICD-10-CM | POA: Diagnosis not present

## 2022-10-13 DIAGNOSIS — I4891 Unspecified atrial fibrillation: Secondary | ICD-10-CM | POA: Diagnosis not present

## 2022-10-13 DIAGNOSIS — J986 Disorders of diaphragm: Secondary | ICD-10-CM | POA: Diagnosis not present

## 2022-10-13 DIAGNOSIS — E785 Hyperlipidemia, unspecified: Secondary | ICD-10-CM | POA: Diagnosis not present

## 2022-10-13 DIAGNOSIS — H409 Unspecified glaucoma: Secondary | ICD-10-CM | POA: Diagnosis not present

## 2022-10-26 DIAGNOSIS — R600 Localized edema: Secondary | ICD-10-CM | POA: Diagnosis not present

## 2022-10-26 DIAGNOSIS — Z66 Do not resuscitate: Secondary | ICD-10-CM | POA: Diagnosis not present

## 2022-10-27 DIAGNOSIS — Z9181 History of falling: Secondary | ICD-10-CM | POA: Diagnosis not present

## 2022-10-27 DIAGNOSIS — J986 Disorders of diaphragm: Secondary | ICD-10-CM | POA: Diagnosis not present

## 2022-10-27 DIAGNOSIS — Z96611 Presence of right artificial shoulder joint: Secondary | ICD-10-CM | POA: Diagnosis not present

## 2022-10-27 DIAGNOSIS — Z96612 Presence of left artificial shoulder joint: Secondary | ICD-10-CM | POA: Diagnosis not present

## 2022-10-27 DIAGNOSIS — Z7901 Long term (current) use of anticoagulants: Secondary | ICD-10-CM | POA: Diagnosis not present

## 2022-10-27 DIAGNOSIS — K5900 Constipation, unspecified: Secondary | ICD-10-CM | POA: Diagnosis not present

## 2022-10-27 DIAGNOSIS — H3530 Unspecified macular degeneration: Secondary | ICD-10-CM | POA: Diagnosis not present

## 2022-10-27 DIAGNOSIS — E785 Hyperlipidemia, unspecified: Secondary | ICD-10-CM | POA: Diagnosis not present

## 2022-10-27 DIAGNOSIS — M1990 Unspecified osteoarthritis, unspecified site: Secondary | ICD-10-CM | POA: Diagnosis not present

## 2022-10-27 DIAGNOSIS — Z87891 Personal history of nicotine dependence: Secondary | ICD-10-CM | POA: Diagnosis not present

## 2022-10-27 DIAGNOSIS — J9601 Acute respiratory failure with hypoxia: Secondary | ICD-10-CM | POA: Diagnosis not present

## 2022-10-27 DIAGNOSIS — G629 Polyneuropathy, unspecified: Secondary | ICD-10-CM | POA: Diagnosis not present

## 2022-10-27 DIAGNOSIS — N183 Chronic kidney disease, stage 3 unspecified: Secondary | ICD-10-CM | POA: Diagnosis not present

## 2022-10-27 DIAGNOSIS — I4891 Unspecified atrial fibrillation: Secondary | ICD-10-CM | POA: Diagnosis not present

## 2022-10-27 DIAGNOSIS — H409 Unspecified glaucoma: Secondary | ICD-10-CM | POA: Diagnosis not present

## 2022-10-27 DIAGNOSIS — I129 Hypertensive chronic kidney disease with stage 1 through stage 4 chronic kidney disease, or unspecified chronic kidney disease: Secondary | ICD-10-CM | POA: Diagnosis not present

## 2022-11-01 DIAGNOSIS — N183 Chronic kidney disease, stage 3 unspecified: Secondary | ICD-10-CM | POA: Diagnosis not present

## 2022-11-01 DIAGNOSIS — J9601 Acute respiratory failure with hypoxia: Secondary | ICD-10-CM | POA: Diagnosis not present

## 2022-11-01 DIAGNOSIS — H409 Unspecified glaucoma: Secondary | ICD-10-CM | POA: Diagnosis not present

## 2022-11-01 DIAGNOSIS — M1990 Unspecified osteoarthritis, unspecified site: Secondary | ICD-10-CM | POA: Diagnosis not present

## 2022-11-01 DIAGNOSIS — G629 Polyneuropathy, unspecified: Secondary | ICD-10-CM | POA: Diagnosis not present

## 2022-11-01 DIAGNOSIS — E785 Hyperlipidemia, unspecified: Secondary | ICD-10-CM | POA: Diagnosis not present

## 2022-11-01 DIAGNOSIS — Z96612 Presence of left artificial shoulder joint: Secondary | ICD-10-CM | POA: Diagnosis not present

## 2022-11-01 DIAGNOSIS — I129 Hypertensive chronic kidney disease with stage 1 through stage 4 chronic kidney disease, or unspecified chronic kidney disease: Secondary | ICD-10-CM | POA: Diagnosis not present

## 2022-11-01 DIAGNOSIS — I4891 Unspecified atrial fibrillation: Secondary | ICD-10-CM | POA: Diagnosis not present

## 2022-11-01 DIAGNOSIS — Z87891 Personal history of nicotine dependence: Secondary | ICD-10-CM | POA: Diagnosis not present

## 2022-11-01 DIAGNOSIS — H3530 Unspecified macular degeneration: Secondary | ICD-10-CM | POA: Diagnosis not present

## 2022-11-01 DIAGNOSIS — Z9181 History of falling: Secondary | ICD-10-CM | POA: Diagnosis not present

## 2022-11-01 DIAGNOSIS — Z7901 Long term (current) use of anticoagulants: Secondary | ICD-10-CM | POA: Diagnosis not present

## 2022-11-01 DIAGNOSIS — Z96611 Presence of right artificial shoulder joint: Secondary | ICD-10-CM | POA: Diagnosis not present

## 2022-11-01 DIAGNOSIS — K5900 Constipation, unspecified: Secondary | ICD-10-CM | POA: Diagnosis not present

## 2022-11-01 DIAGNOSIS — J986 Disorders of diaphragm: Secondary | ICD-10-CM | POA: Diagnosis not present

## 2022-11-03 DIAGNOSIS — I4891 Unspecified atrial fibrillation: Secondary | ICD-10-CM | POA: Diagnosis not present

## 2022-11-03 DIAGNOSIS — N183 Chronic kidney disease, stage 3 unspecified: Secondary | ICD-10-CM | POA: Diagnosis not present

## 2022-11-03 DIAGNOSIS — H3530 Unspecified macular degeneration: Secondary | ICD-10-CM | POA: Diagnosis not present

## 2022-11-03 DIAGNOSIS — J9601 Acute respiratory failure with hypoxia: Secondary | ICD-10-CM | POA: Diagnosis not present

## 2022-11-03 DIAGNOSIS — Z96612 Presence of left artificial shoulder joint: Secondary | ICD-10-CM | POA: Diagnosis not present

## 2022-11-03 DIAGNOSIS — J986 Disorders of diaphragm: Secondary | ICD-10-CM | POA: Diagnosis not present

## 2022-11-03 DIAGNOSIS — M1990 Unspecified osteoarthritis, unspecified site: Secondary | ICD-10-CM | POA: Diagnosis not present

## 2022-11-03 DIAGNOSIS — Z96611 Presence of right artificial shoulder joint: Secondary | ICD-10-CM | POA: Diagnosis not present

## 2022-11-03 DIAGNOSIS — H409 Unspecified glaucoma: Secondary | ICD-10-CM | POA: Diagnosis not present

## 2022-11-03 DIAGNOSIS — G629 Polyneuropathy, unspecified: Secondary | ICD-10-CM | POA: Diagnosis not present

## 2022-11-03 DIAGNOSIS — K5900 Constipation, unspecified: Secondary | ICD-10-CM | POA: Diagnosis not present

## 2022-11-03 DIAGNOSIS — E785 Hyperlipidemia, unspecified: Secondary | ICD-10-CM | POA: Diagnosis not present

## 2022-11-03 DIAGNOSIS — Z9181 History of falling: Secondary | ICD-10-CM | POA: Diagnosis not present

## 2022-11-03 DIAGNOSIS — Z7901 Long term (current) use of anticoagulants: Secondary | ICD-10-CM | POA: Diagnosis not present

## 2022-11-03 DIAGNOSIS — Z87891 Personal history of nicotine dependence: Secondary | ICD-10-CM | POA: Diagnosis not present

## 2022-11-03 DIAGNOSIS — I129 Hypertensive chronic kidney disease with stage 1 through stage 4 chronic kidney disease, or unspecified chronic kidney disease: Secondary | ICD-10-CM | POA: Diagnosis not present

## 2022-11-08 DIAGNOSIS — H409 Unspecified glaucoma: Secondary | ICD-10-CM | POA: Diagnosis not present

## 2022-11-08 DIAGNOSIS — Z87891 Personal history of nicotine dependence: Secondary | ICD-10-CM | POA: Diagnosis not present

## 2022-11-08 DIAGNOSIS — Z9981 Dependence on supplemental oxygen: Secondary | ICD-10-CM | POA: Diagnosis not present

## 2022-11-08 DIAGNOSIS — G629 Polyneuropathy, unspecified: Secondary | ICD-10-CM | POA: Diagnosis not present

## 2022-11-08 DIAGNOSIS — Z7901 Long term (current) use of anticoagulants: Secondary | ICD-10-CM | POA: Diagnosis not present

## 2022-11-08 DIAGNOSIS — M1990 Unspecified osteoarthritis, unspecified site: Secondary | ICD-10-CM | POA: Diagnosis not present

## 2022-11-08 DIAGNOSIS — H3530 Unspecified macular degeneration: Secondary | ICD-10-CM | POA: Diagnosis not present

## 2022-11-08 DIAGNOSIS — J986 Disorders of diaphragm: Secondary | ICD-10-CM | POA: Diagnosis not present

## 2022-11-08 DIAGNOSIS — Z96611 Presence of right artificial shoulder joint: Secondary | ICD-10-CM | POA: Diagnosis not present

## 2022-11-08 DIAGNOSIS — I4891 Unspecified atrial fibrillation: Secondary | ICD-10-CM | POA: Diagnosis not present

## 2022-11-08 DIAGNOSIS — Z9181 History of falling: Secondary | ICD-10-CM | POA: Diagnosis not present

## 2022-11-08 DIAGNOSIS — N183 Chronic kidney disease, stage 3 unspecified: Secondary | ICD-10-CM | POA: Diagnosis not present

## 2022-11-08 DIAGNOSIS — M6281 Muscle weakness (generalized): Secondary | ICD-10-CM | POA: Diagnosis not present

## 2022-11-08 DIAGNOSIS — Z96612 Presence of left artificial shoulder joint: Secondary | ICD-10-CM | POA: Diagnosis not present

## 2022-11-08 DIAGNOSIS — K5900 Constipation, unspecified: Secondary | ICD-10-CM | POA: Diagnosis not present

## 2022-11-08 DIAGNOSIS — I129 Hypertensive chronic kidney disease with stage 1 through stage 4 chronic kidney disease, or unspecified chronic kidney disease: Secondary | ICD-10-CM | POA: Diagnosis not present

## 2022-11-08 DIAGNOSIS — E785 Hyperlipidemia, unspecified: Secondary | ICD-10-CM | POA: Diagnosis not present

## 2022-11-08 DIAGNOSIS — J9601 Acute respiratory failure with hypoxia: Secondary | ICD-10-CM | POA: Diagnosis not present

## 2022-11-10 DIAGNOSIS — Z96611 Presence of right artificial shoulder joint: Secondary | ICD-10-CM | POA: Diagnosis not present

## 2022-11-10 DIAGNOSIS — Z96612 Presence of left artificial shoulder joint: Secondary | ICD-10-CM | POA: Diagnosis not present

## 2022-11-10 DIAGNOSIS — H3530 Unspecified macular degeneration: Secondary | ICD-10-CM | POA: Diagnosis not present

## 2022-11-10 DIAGNOSIS — Z9181 History of falling: Secondary | ICD-10-CM | POA: Diagnosis not present

## 2022-11-10 DIAGNOSIS — I129 Hypertensive chronic kidney disease with stage 1 through stage 4 chronic kidney disease, or unspecified chronic kidney disease: Secondary | ICD-10-CM | POA: Diagnosis not present

## 2022-11-10 DIAGNOSIS — E785 Hyperlipidemia, unspecified: Secondary | ICD-10-CM | POA: Diagnosis not present

## 2022-11-10 DIAGNOSIS — G629 Polyneuropathy, unspecified: Secondary | ICD-10-CM | POA: Diagnosis not present

## 2022-11-10 DIAGNOSIS — J9601 Acute respiratory failure with hypoxia: Secondary | ICD-10-CM | POA: Diagnosis not present

## 2022-11-10 DIAGNOSIS — J986 Disorders of diaphragm: Secondary | ICD-10-CM | POA: Diagnosis not present

## 2022-11-10 DIAGNOSIS — Z87891 Personal history of nicotine dependence: Secondary | ICD-10-CM | POA: Diagnosis not present

## 2022-11-10 DIAGNOSIS — Z7901 Long term (current) use of anticoagulants: Secondary | ICD-10-CM | POA: Diagnosis not present

## 2022-11-10 DIAGNOSIS — K5900 Constipation, unspecified: Secondary | ICD-10-CM | POA: Diagnosis not present

## 2022-11-10 DIAGNOSIS — M1990 Unspecified osteoarthritis, unspecified site: Secondary | ICD-10-CM | POA: Diagnosis not present

## 2022-11-10 DIAGNOSIS — N183 Chronic kidney disease, stage 3 unspecified: Secondary | ICD-10-CM | POA: Diagnosis not present

## 2022-11-10 DIAGNOSIS — H409 Unspecified glaucoma: Secondary | ICD-10-CM | POA: Diagnosis not present

## 2022-11-10 DIAGNOSIS — I4891 Unspecified atrial fibrillation: Secondary | ICD-10-CM | POA: Diagnosis not present

## 2022-11-17 DIAGNOSIS — I4891 Unspecified atrial fibrillation: Secondary | ICD-10-CM | POA: Diagnosis not present

## 2022-11-17 DIAGNOSIS — H3530 Unspecified macular degeneration: Secondary | ICD-10-CM | POA: Diagnosis not present

## 2022-11-17 DIAGNOSIS — Z96612 Presence of left artificial shoulder joint: Secondary | ICD-10-CM | POA: Diagnosis not present

## 2022-11-17 DIAGNOSIS — H409 Unspecified glaucoma: Secondary | ICD-10-CM | POA: Diagnosis not present

## 2022-11-17 DIAGNOSIS — Z87891 Personal history of nicotine dependence: Secondary | ICD-10-CM | POA: Diagnosis not present

## 2022-11-17 DIAGNOSIS — E785 Hyperlipidemia, unspecified: Secondary | ICD-10-CM | POA: Diagnosis not present

## 2022-11-17 DIAGNOSIS — K5900 Constipation, unspecified: Secondary | ICD-10-CM | POA: Diagnosis not present

## 2022-11-17 DIAGNOSIS — Z9181 History of falling: Secondary | ICD-10-CM | POA: Diagnosis not present

## 2022-11-17 DIAGNOSIS — I129 Hypertensive chronic kidney disease with stage 1 through stage 4 chronic kidney disease, or unspecified chronic kidney disease: Secondary | ICD-10-CM | POA: Diagnosis not present

## 2022-11-17 DIAGNOSIS — M1990 Unspecified osteoarthritis, unspecified site: Secondary | ICD-10-CM | POA: Diagnosis not present

## 2022-11-17 DIAGNOSIS — Z7901 Long term (current) use of anticoagulants: Secondary | ICD-10-CM | POA: Diagnosis not present

## 2022-11-17 DIAGNOSIS — G629 Polyneuropathy, unspecified: Secondary | ICD-10-CM | POA: Diagnosis not present

## 2022-11-17 DIAGNOSIS — N183 Chronic kidney disease, stage 3 unspecified: Secondary | ICD-10-CM | POA: Diagnosis not present

## 2022-11-17 DIAGNOSIS — J986 Disorders of diaphragm: Secondary | ICD-10-CM | POA: Diagnosis not present

## 2022-11-17 DIAGNOSIS — Z96611 Presence of right artificial shoulder joint: Secondary | ICD-10-CM | POA: Diagnosis not present

## 2022-11-17 DIAGNOSIS — J9601 Acute respiratory failure with hypoxia: Secondary | ICD-10-CM | POA: Diagnosis not present

## 2022-11-23 DIAGNOSIS — U071 COVID-19: Secondary | ICD-10-CM | POA: Diagnosis not present

## 2022-11-23 DIAGNOSIS — N182 Chronic kidney disease, stage 2 (mild): Secondary | ICD-10-CM | POA: Diagnosis not present

## 2022-11-26 ENCOUNTER — Ambulatory Visit: Payer: Medicare Other | Admitting: Cardiology

## 2022-12-03 DIAGNOSIS — J9601 Acute respiratory failure with hypoxia: Secondary | ICD-10-CM | POA: Diagnosis not present

## 2022-12-03 DIAGNOSIS — H409 Unspecified glaucoma: Secondary | ICD-10-CM | POA: Diagnosis not present

## 2022-12-03 DIAGNOSIS — N183 Chronic kidney disease, stage 3 unspecified: Secondary | ICD-10-CM | POA: Diagnosis not present

## 2022-12-03 DIAGNOSIS — Z7901 Long term (current) use of anticoagulants: Secondary | ICD-10-CM | POA: Diagnosis not present

## 2022-12-03 DIAGNOSIS — H3530 Unspecified macular degeneration: Secondary | ICD-10-CM | POA: Diagnosis not present

## 2022-12-03 DIAGNOSIS — G629 Polyneuropathy, unspecified: Secondary | ICD-10-CM | POA: Diagnosis not present

## 2022-12-03 DIAGNOSIS — J986 Disorders of diaphragm: Secondary | ICD-10-CM | POA: Diagnosis not present

## 2022-12-03 DIAGNOSIS — M1990 Unspecified osteoarthritis, unspecified site: Secondary | ICD-10-CM | POA: Diagnosis not present

## 2022-12-03 DIAGNOSIS — I4891 Unspecified atrial fibrillation: Secondary | ICD-10-CM | POA: Diagnosis not present

## 2022-12-03 DIAGNOSIS — Z96611 Presence of right artificial shoulder joint: Secondary | ICD-10-CM | POA: Diagnosis not present

## 2022-12-03 DIAGNOSIS — Z96612 Presence of left artificial shoulder joint: Secondary | ICD-10-CM | POA: Diagnosis not present

## 2022-12-03 DIAGNOSIS — E785 Hyperlipidemia, unspecified: Secondary | ICD-10-CM | POA: Diagnosis not present

## 2022-12-03 DIAGNOSIS — I129 Hypertensive chronic kidney disease with stage 1 through stage 4 chronic kidney disease, or unspecified chronic kidney disease: Secondary | ICD-10-CM | POA: Diagnosis not present

## 2022-12-03 DIAGNOSIS — K5900 Constipation, unspecified: Secondary | ICD-10-CM | POA: Diagnosis not present

## 2022-12-03 DIAGNOSIS — Z9181 History of falling: Secondary | ICD-10-CM | POA: Diagnosis not present

## 2022-12-03 DIAGNOSIS — Z87891 Personal history of nicotine dependence: Secondary | ICD-10-CM | POA: Diagnosis not present

## 2022-12-07 DIAGNOSIS — I4891 Unspecified atrial fibrillation: Secondary | ICD-10-CM | POA: Diagnosis not present

## 2022-12-07 DIAGNOSIS — Z7901 Long term (current) use of anticoagulants: Secondary | ICD-10-CM | POA: Diagnosis not present

## 2022-12-07 DIAGNOSIS — H409 Unspecified glaucoma: Secondary | ICD-10-CM | POA: Diagnosis not present

## 2022-12-07 DIAGNOSIS — N183 Chronic kidney disease, stage 3 unspecified: Secondary | ICD-10-CM | POA: Diagnosis not present

## 2022-12-07 DIAGNOSIS — K5900 Constipation, unspecified: Secondary | ICD-10-CM | POA: Diagnosis not present

## 2022-12-07 DIAGNOSIS — Z87891 Personal history of nicotine dependence: Secondary | ICD-10-CM | POA: Diagnosis not present

## 2022-12-07 DIAGNOSIS — J9601 Acute respiratory failure with hypoxia: Secondary | ICD-10-CM | POA: Diagnosis not present

## 2022-12-07 DIAGNOSIS — Z9181 History of falling: Secondary | ICD-10-CM | POA: Diagnosis not present

## 2022-12-07 DIAGNOSIS — E785 Hyperlipidemia, unspecified: Secondary | ICD-10-CM | POA: Diagnosis not present

## 2022-12-07 DIAGNOSIS — M1990 Unspecified osteoarthritis, unspecified site: Secondary | ICD-10-CM | POA: Diagnosis not present

## 2022-12-07 DIAGNOSIS — H3530 Unspecified macular degeneration: Secondary | ICD-10-CM | POA: Diagnosis not present

## 2022-12-07 DIAGNOSIS — J986 Disorders of diaphragm: Secondary | ICD-10-CM | POA: Diagnosis not present

## 2022-12-07 DIAGNOSIS — Z96611 Presence of right artificial shoulder joint: Secondary | ICD-10-CM | POA: Diagnosis not present

## 2022-12-07 DIAGNOSIS — G629 Polyneuropathy, unspecified: Secondary | ICD-10-CM | POA: Diagnosis not present

## 2022-12-07 DIAGNOSIS — I129 Hypertensive chronic kidney disease with stage 1 through stage 4 chronic kidney disease, or unspecified chronic kidney disease: Secondary | ICD-10-CM | POA: Diagnosis not present

## 2022-12-07 DIAGNOSIS — Z96612 Presence of left artificial shoulder joint: Secondary | ICD-10-CM | POA: Diagnosis not present

## 2022-12-09 DIAGNOSIS — Z9981 Dependence on supplemental oxygen: Secondary | ICD-10-CM | POA: Diagnosis not present

## 2022-12-09 DIAGNOSIS — Z9181 History of falling: Secondary | ICD-10-CM | POA: Diagnosis not present

## 2022-12-09 DIAGNOSIS — J9601 Acute respiratory failure with hypoxia: Secondary | ICD-10-CM | POA: Diagnosis not present

## 2022-12-09 DIAGNOSIS — M6281 Muscle weakness (generalized): Secondary | ICD-10-CM | POA: Diagnosis not present

## 2022-12-14 DIAGNOSIS — N1831 Chronic kidney disease, stage 3a: Secondary | ICD-10-CM | POA: Diagnosis not present

## 2022-12-14 DIAGNOSIS — M7989 Other specified soft tissue disorders: Secondary | ICD-10-CM | POA: Diagnosis not present

## 2022-12-14 DIAGNOSIS — G629 Polyneuropathy, unspecified: Secondary | ICD-10-CM | POA: Diagnosis not present

## 2022-12-16 DIAGNOSIS — M1990 Unspecified osteoarthritis, unspecified site: Secondary | ICD-10-CM | POA: Diagnosis not present

## 2022-12-16 DIAGNOSIS — J986 Disorders of diaphragm: Secondary | ICD-10-CM | POA: Diagnosis not present

## 2022-12-16 DIAGNOSIS — Z96612 Presence of left artificial shoulder joint: Secondary | ICD-10-CM | POA: Diagnosis not present

## 2022-12-16 DIAGNOSIS — Z96611 Presence of right artificial shoulder joint: Secondary | ICD-10-CM | POA: Diagnosis not present

## 2022-12-16 DIAGNOSIS — J9601 Acute respiratory failure with hypoxia: Secondary | ICD-10-CM | POA: Diagnosis not present

## 2022-12-16 DIAGNOSIS — I129 Hypertensive chronic kidney disease with stage 1 through stage 4 chronic kidney disease, or unspecified chronic kidney disease: Secondary | ICD-10-CM | POA: Diagnosis not present

## 2022-12-16 DIAGNOSIS — H409 Unspecified glaucoma: Secondary | ICD-10-CM | POA: Diagnosis not present

## 2022-12-16 DIAGNOSIS — I4891 Unspecified atrial fibrillation: Secondary | ICD-10-CM | POA: Diagnosis not present

## 2022-12-16 DIAGNOSIS — E785 Hyperlipidemia, unspecified: Secondary | ICD-10-CM | POA: Diagnosis not present

## 2022-12-16 DIAGNOSIS — Z9181 History of falling: Secondary | ICD-10-CM | POA: Diagnosis not present

## 2022-12-16 DIAGNOSIS — G629 Polyneuropathy, unspecified: Secondary | ICD-10-CM | POA: Diagnosis not present

## 2022-12-16 DIAGNOSIS — K5900 Constipation, unspecified: Secondary | ICD-10-CM | POA: Diagnosis not present

## 2022-12-16 DIAGNOSIS — N183 Chronic kidney disease, stage 3 unspecified: Secondary | ICD-10-CM | POA: Diagnosis not present

## 2022-12-16 DIAGNOSIS — Z87891 Personal history of nicotine dependence: Secondary | ICD-10-CM | POA: Diagnosis not present

## 2022-12-16 DIAGNOSIS — H3530 Unspecified macular degeneration: Secondary | ICD-10-CM | POA: Diagnosis not present

## 2022-12-16 DIAGNOSIS — Z7901 Long term (current) use of anticoagulants: Secondary | ICD-10-CM | POA: Diagnosis not present

## 2022-12-24 DIAGNOSIS — J986 Disorders of diaphragm: Secondary | ICD-10-CM | POA: Diagnosis not present

## 2022-12-24 DIAGNOSIS — Z87891 Personal history of nicotine dependence: Secondary | ICD-10-CM | POA: Diagnosis not present

## 2022-12-24 DIAGNOSIS — K5900 Constipation, unspecified: Secondary | ICD-10-CM | POA: Diagnosis not present

## 2022-12-24 DIAGNOSIS — I4891 Unspecified atrial fibrillation: Secondary | ICD-10-CM | POA: Diagnosis not present

## 2022-12-24 DIAGNOSIS — Z96611 Presence of right artificial shoulder joint: Secondary | ICD-10-CM | POA: Diagnosis not present

## 2022-12-24 DIAGNOSIS — E785 Hyperlipidemia, unspecified: Secondary | ICD-10-CM | POA: Diagnosis not present

## 2022-12-24 DIAGNOSIS — J9601 Acute respiratory failure with hypoxia: Secondary | ICD-10-CM | POA: Diagnosis not present

## 2022-12-24 DIAGNOSIS — H3530 Unspecified macular degeneration: Secondary | ICD-10-CM | POA: Diagnosis not present

## 2022-12-24 DIAGNOSIS — Z7901 Long term (current) use of anticoagulants: Secondary | ICD-10-CM | POA: Diagnosis not present

## 2022-12-24 DIAGNOSIS — H409 Unspecified glaucoma: Secondary | ICD-10-CM | POA: Diagnosis not present

## 2022-12-24 DIAGNOSIS — G629 Polyneuropathy, unspecified: Secondary | ICD-10-CM | POA: Diagnosis not present

## 2022-12-24 DIAGNOSIS — M1990 Unspecified osteoarthritis, unspecified site: Secondary | ICD-10-CM | POA: Diagnosis not present

## 2022-12-24 DIAGNOSIS — Z96612 Presence of left artificial shoulder joint: Secondary | ICD-10-CM | POA: Diagnosis not present

## 2022-12-24 DIAGNOSIS — I129 Hypertensive chronic kidney disease with stage 1 through stage 4 chronic kidney disease, or unspecified chronic kidney disease: Secondary | ICD-10-CM | POA: Diagnosis not present

## 2022-12-24 DIAGNOSIS — N183 Chronic kidney disease, stage 3 unspecified: Secondary | ICD-10-CM | POA: Diagnosis not present

## 2022-12-24 DIAGNOSIS — Z9181 History of falling: Secondary | ICD-10-CM | POA: Diagnosis not present

## 2022-12-28 DIAGNOSIS — N183 Chronic kidney disease, stage 3 unspecified: Secondary | ICD-10-CM | POA: Diagnosis not present

## 2022-12-28 DIAGNOSIS — J986 Disorders of diaphragm: Secondary | ICD-10-CM | POA: Diagnosis not present

## 2022-12-28 DIAGNOSIS — Z96612 Presence of left artificial shoulder joint: Secondary | ICD-10-CM | POA: Diagnosis not present

## 2022-12-28 DIAGNOSIS — I4891 Unspecified atrial fibrillation: Secondary | ICD-10-CM | POA: Diagnosis not present

## 2022-12-28 DIAGNOSIS — G629 Polyneuropathy, unspecified: Secondary | ICD-10-CM | POA: Diagnosis not present

## 2022-12-28 DIAGNOSIS — K5900 Constipation, unspecified: Secondary | ICD-10-CM | POA: Diagnosis not present

## 2022-12-28 DIAGNOSIS — H3530 Unspecified macular degeneration: Secondary | ICD-10-CM | POA: Diagnosis not present

## 2022-12-28 DIAGNOSIS — R600 Localized edema: Secondary | ICD-10-CM | POA: Diagnosis not present

## 2022-12-28 DIAGNOSIS — J9601 Acute respiratory failure with hypoxia: Secondary | ICD-10-CM | POA: Diagnosis not present

## 2022-12-28 DIAGNOSIS — H409 Unspecified glaucoma: Secondary | ICD-10-CM | POA: Diagnosis not present

## 2022-12-28 DIAGNOSIS — Z96611 Presence of right artificial shoulder joint: Secondary | ICD-10-CM | POA: Diagnosis not present

## 2022-12-28 DIAGNOSIS — I129 Hypertensive chronic kidney disease with stage 1 through stage 4 chronic kidney disease, or unspecified chronic kidney disease: Secondary | ICD-10-CM | POA: Diagnosis not present

## 2022-12-28 DIAGNOSIS — Z7901 Long term (current) use of anticoagulants: Secondary | ICD-10-CM | POA: Diagnosis not present

## 2022-12-28 DIAGNOSIS — Z9181 History of falling: Secondary | ICD-10-CM | POA: Diagnosis not present

## 2022-12-28 DIAGNOSIS — Z87891 Personal history of nicotine dependence: Secondary | ICD-10-CM | POA: Diagnosis not present

## 2022-12-28 DIAGNOSIS — E785 Hyperlipidemia, unspecified: Secondary | ICD-10-CM | POA: Diagnosis not present

## 2022-12-28 DIAGNOSIS — M1990 Unspecified osteoarthritis, unspecified site: Secondary | ICD-10-CM | POA: Diagnosis not present

## 2023-01-04 DIAGNOSIS — I129 Hypertensive chronic kidney disease with stage 1 through stage 4 chronic kidney disease, or unspecified chronic kidney disease: Secondary | ICD-10-CM | POA: Diagnosis not present

## 2023-01-04 DIAGNOSIS — E785 Hyperlipidemia, unspecified: Secondary | ICD-10-CM | POA: Diagnosis not present

## 2023-01-04 DIAGNOSIS — Z9181 History of falling: Secondary | ICD-10-CM | POA: Diagnosis not present

## 2023-01-04 DIAGNOSIS — N183 Chronic kidney disease, stage 3 unspecified: Secondary | ICD-10-CM | POA: Diagnosis not present

## 2023-01-04 DIAGNOSIS — G629 Polyneuropathy, unspecified: Secondary | ICD-10-CM | POA: Diagnosis not present

## 2023-01-04 DIAGNOSIS — Z7901 Long term (current) use of anticoagulants: Secondary | ICD-10-CM | POA: Diagnosis not present

## 2023-01-04 DIAGNOSIS — Z96612 Presence of left artificial shoulder joint: Secondary | ICD-10-CM | POA: Diagnosis not present

## 2023-01-04 DIAGNOSIS — H3530 Unspecified macular degeneration: Secondary | ICD-10-CM | POA: Diagnosis not present

## 2023-01-04 DIAGNOSIS — Z96611 Presence of right artificial shoulder joint: Secondary | ICD-10-CM | POA: Diagnosis not present

## 2023-01-04 DIAGNOSIS — I4891 Unspecified atrial fibrillation: Secondary | ICD-10-CM | POA: Diagnosis not present

## 2023-01-04 DIAGNOSIS — J9601 Acute respiratory failure with hypoxia: Secondary | ICD-10-CM | POA: Diagnosis not present

## 2023-01-04 DIAGNOSIS — H409 Unspecified glaucoma: Secondary | ICD-10-CM | POA: Diagnosis not present

## 2023-01-04 DIAGNOSIS — Z87891 Personal history of nicotine dependence: Secondary | ICD-10-CM | POA: Diagnosis not present

## 2023-01-04 DIAGNOSIS — J986 Disorders of diaphragm: Secondary | ICD-10-CM | POA: Diagnosis not present

## 2023-01-04 DIAGNOSIS — K5900 Constipation, unspecified: Secondary | ICD-10-CM | POA: Diagnosis not present

## 2023-01-04 DIAGNOSIS — M1990 Unspecified osteoarthritis, unspecified site: Secondary | ICD-10-CM | POA: Diagnosis not present

## 2023-01-05 ENCOUNTER — Other Ambulatory Visit: Payer: Self-pay | Admitting: Cardiology

## 2023-01-05 DIAGNOSIS — I10 Essential (primary) hypertension: Secondary | ICD-10-CM | POA: Diagnosis not present

## 2023-01-05 DIAGNOSIS — Z7901 Long term (current) use of anticoagulants: Secondary | ICD-10-CM | POA: Diagnosis not present

## 2023-01-05 DIAGNOSIS — I480 Paroxysmal atrial fibrillation: Secondary | ICD-10-CM | POA: Diagnosis not present

## 2023-01-06 LAB — BASIC METABOLIC PANEL
BUN/Creatinine Ratio: 19 (ref 10–24)
BUN: 26 mg/dL (ref 10–36)
CO2: 27 mmol/L (ref 20–29)
Calcium: 9.7 mg/dL (ref 8.6–10.2)
Chloride: 105 mmol/L (ref 96–106)
Creatinine, Ser: 1.36 mg/dL — ABNORMAL HIGH (ref 0.76–1.27)
Glucose: 95 mg/dL (ref 70–99)
Potassium: 4.5 mmol/L (ref 3.5–5.2)
Sodium: 146 mmol/L — ABNORMAL HIGH (ref 134–144)
eGFR: 48 mL/min/{1.73_m2} — ABNORMAL LOW (ref >59)

## 2023-01-06 LAB — HEMOGLOBIN AND HEMATOCRIT, BLOOD
Hematocrit: 40.2 % (ref 37.5–51.0)
Hemoglobin: 13.8 g/dL (ref 13.0–17.7)

## 2023-01-06 LAB — BRAIN NATRIURETIC PEPTIDE: BNP: 82.7 pg/mL (ref 0.0–100.0)

## 2023-01-07 NOTE — Progress Notes (Signed)
LMTCB

## 2023-01-09 DIAGNOSIS — Z9981 Dependence on supplemental oxygen: Secondary | ICD-10-CM | POA: Diagnosis not present

## 2023-01-09 DIAGNOSIS — M6281 Muscle weakness (generalized): Secondary | ICD-10-CM | POA: Diagnosis not present

## 2023-01-09 DIAGNOSIS — Z9181 History of falling: Secondary | ICD-10-CM | POA: Diagnosis not present

## 2023-01-09 DIAGNOSIS — J9601 Acute respiratory failure with hypoxia: Secondary | ICD-10-CM | POA: Diagnosis not present

## 2023-01-11 ENCOUNTER — Encounter: Payer: Self-pay | Admitting: Cardiology

## 2023-01-11 ENCOUNTER — Ambulatory Visit: Payer: Medicare Other | Admitting: Cardiology

## 2023-01-11 VITALS — BP 152/80 | HR 82 | Resp 16 | Ht 68.0 in | Wt 182.8 lb

## 2023-01-11 DIAGNOSIS — R0602 Shortness of breath: Secondary | ICD-10-CM

## 2023-01-11 DIAGNOSIS — Z7901 Long term (current) use of anticoagulants: Secondary | ICD-10-CM

## 2023-01-11 DIAGNOSIS — N183 Chronic kidney disease, stage 3 unspecified: Secondary | ICD-10-CM

## 2023-01-11 DIAGNOSIS — I5031 Acute diastolic (congestive) heart failure: Secondary | ICD-10-CM | POA: Diagnosis not present

## 2023-01-11 DIAGNOSIS — I480 Paroxysmal atrial fibrillation: Secondary | ICD-10-CM | POA: Diagnosis not present

## 2023-01-11 DIAGNOSIS — I493 Ventricular premature depolarization: Secondary | ICD-10-CM

## 2023-01-11 DIAGNOSIS — Z87891 Personal history of nicotine dependence: Secondary | ICD-10-CM

## 2023-01-11 MED ORDER — ENTRESTO 24-26 MG PO TABS: 1.0000 | ORAL_TABLET | Freq: Two times a day (BID) | ORAL | 0 refills | Status: AC

## 2023-01-11 NOTE — Progress Notes (Signed)
Date: 01/11/23 Last Office Visit: 07/27/2022  Chief Complaint  Patient presents with   Follow-up    6 months   Atrial Fibrillation   Congestive Heart Failure    HPI  Shane Borsch Sr. is a 89 y.o. male whose past medical history and cardiac risk factors include: Paroxysmal atrial fibrillation, HFpEF, hypertension with chronic kidney disease stage III, chronic venous insufficiency, PVCs, advanced age.  Patient presents today for 22-monthfollow-up visit given his history of atrial fibrillation and HFpEF.  He is accompanied by his daughter at today's office visit.  He is currently in an assisted living facility.  He has successfully come off of supplemental oxygen and no longer uses a wheelchair.  However, his weight has been trending up since last office visit he has gained approximately 25 pounds.  He has bilateral lower extremity swelling up to the midshin.  Based on the medication log provided by the assisted living facility he is currently on Lasix 20 mg every other day except on Mondays and Fridays he takes 40 mg p.o. daily.  Patient's daughter states that his baseline weight is around 173-175 pounds and at the last office visit he was 157 pounds.  He denies anginal discomfort.  ALLERGIES: Allergies  Allergen Reactions   Tape Rash   MEDICATION LIST PRIOR TO VISIT: Current Outpatient Medications on File Prior to Visit  Medication Sig Dispense Refill   acetaminophen (TYLENOL) 325 MG tablet Take 650 mg by mouth every 6 (six) hours as needed.     calcium carbonate (TUMS CHEWY BITES) 750 MG chewable tablet Chew 1 tablet by mouth as needed for heartburn.     dextromethorphan (DELSYM) 30 MG/5ML liquid Take by mouth at bedtime as needed.     diltiazem (CARDIZEM CD) 120 MG 24 hr capsule TAKE 1 CAPSULE BY MOUTH EVERY DAY 90 capsule 0   DULoxetine (CYMBALTA) 20 MG capsule Take 20 mg by mouth daily.     ELIQUIS 5 MG TABS tablet Take 5 mg by mouth 2 (two) times daily.      furosemide (LASIX) 20 MG tablet Take 20 mg by mouth daily. Tuesday, Wednesday, Thursday, Saturday     furosemide (LASIX) 40 MG tablet Take 40 mg by mouth daily. Every Monday and Friday     guaiFENesin (MUCINEX) 600 MG 12 hr tablet Take by mouth.     latanoprost (XALATAN) 0.005 % ophthalmic solution INSTILL 1 DROP INTO BOTH EYES EVERY DAY AT NIGHT     Multiple Vitamins-Minerals (PRESERVISION AREDS 2+MULTI VIT PO) Take 2 capsules by mouth daily.     Polyethyl Glycol-Propyl Glycol (SYSTANE) 0.4-0.3 % SOLN See admin instructions.     polyethylene glycol (MIRALAX / GLYCOLAX) 17 g packet 1 packet mixed with 8 ounces of fluid     pregabalin (LYRICA) 150 MG capsule Take 150 mg by mouth 2 (two) times daily.     No current facility-administered medications on file prior to visit.    PAST MEDICAL HISTORY: Past Medical History:  Diagnosis Date   (HFpEF) heart failure with preserved ejection fraction (HCC)    AAA (abdominal aortic aneurysm) (HCC)    Chronic kidney disease    Glaucoma    Hyperlipemia    Hypertension    Paroxysmal atrial fibrillation (HCC)    PVC (premature ventricular contraction)     PAST SURGICAL HISTORY: Past Surgical History:  Procedure Laterality Date   APPENDECTOMY     EYE SURGERY     lens implants   HERNIA  REPAIR     TENNIS ELBOW RELEASE/NIRSCHEL PROCEDURE     TONSILLECTOMY     TOTAL SHOULDER REPLACEMENT Bilateral     FAMILY HISTORY: The patient family history is not on file.   SOCIAL HISTORY:  The patient  reports that he quit smoking about 63 years ago. His smoking use included cigarettes. He has a 30.00 pack-year smoking history. He has never used smokeless tobacco. He reports current alcohol use. He reports that he does not use drugs.  Review of Systems  Cardiovascular:  Negative for chest pain, claudication, dyspnea on exertion, irregular heartbeat, leg swelling, near-syncope, orthopnea, palpitations, paroxysmal nocturnal dyspnea and syncope.  Respiratory:   Negative for shortness of breath.   Hematologic/Lymphatic: Negative for bleeding problem.  Musculoskeletal:  Negative for muscle cramps and myalgias.  Neurological:  Negative for dizziness and light-headedness.    PHYSICAL EXAM:    01/11/2023   10:17 AM 01/11/2023   10:10 AM 07/27/2022    9:28 AM  Vitals with BMI  Height  5' 8"$  5' 8"$   Weight  182 lbs 13 oz 157 lbs  BMI  0000000 [id number]  Systolic 0000000 [id number]  Diastolic 80 87 51  Pulse  82 60   Physical Exam  Constitutional: No distress.  Appears older than stated age, ambulates w/ walker, hemodynamically stable.   Neck: No JVD present.  Cardiovascular: Normal rate, regular rhythm, S1 normal, S2 normal, intact distal pulses and normal pulses. Exam reveals no gallop, no S3 and no S4.  Murmur heard. Holosystolic murmur is present with a grade of 3/6 at the lower left sternal border. Pulmonary/Chest: Effort normal and breath sounds normal. No stridor. He has no wheezes. He has no rales.  Abdominal: Soft. Bowel sounds are normal. He exhibits no distension. There is no abdominal tenderness.  Musculoskeletal:        General: Edema present.     Cervical back: Neck supple.  Neurological: He is alert and oriented to person, place, and time. He has intact cranial nerves (2-12).  Skin: Skin is warm and moist.   CARDIAC DATABASE: EKG: 03/27/2021: Normal sinus rhythm, 86 bpm, first-degree AV block, right bundle branch block, left axis deviation, left anterior fascicular block, frequent PVCs.  05/27/2022: Normal sinus rhythm, 71 bpm, left axis, first-degree AV block, right bundle branch block, ST-T changes likely secondary RBBB.   07/27/22 Sinus Bradycardia, 58bpm, First degree A-V block, RBBB.   01/11/2023: Sinus rhythm, 77 bpm, first-degree AV block, right bundle branch block, left axis, left anterior fascicular block.  Echocardiogram: 04/07/2021:  Left ventricle cavity is normal in size. Moderate concentric hypertrophy of the left ventricle.  Normal global wall motion. Normal LV systolic function with EF 60%. Doppler evidence of grade I (impaired) diastolic dysfunction, normal LAP.  Right ventricle cavity is mildly dilated.  Normal right ventricular function.  Moderate tricuspid regurgitation. Peak RA-RV gradient 27 mmHg.  Mild pulmonic regurgitation.  IVC not seen.  No significant change compared to previous study in 2019.   07/04/2022 @ [facility]:  Left Ventricle: Systolic function is low normal. EF: 50-55%. Ejection fraction measured by 3D is 50%,    Left Ventricle: Doppler parameters consistent with mild diastolic dysfunction and low to normal LA pressure.    Right Atrium: Right atrium is mildly to moderately dilated.    Aortic Valve: There is no regurgitation or stenosis.    Mitral Valve: There is moderate regurgitation.    Tricuspid Valve: There is moderate regurgitation.    Tricuspid Valve: The  right ventricular systolic pressure is moderately elevated (50-59 mmHg).  Carotid artery duplex 07/27/2017: No hemodynamically significant arterial disease in the internal carotid artery bilaterally. Minimal soft plaque noted. Antegrade right vertebral artery flow. Antegrade left vertebral artery flow.  Ultrasound abdominal aorta:  12/18/2018: The maximum aorta diameter is 2.1 cm (mid). No evidence of atherosclerotic plaque. Normal flow velocities noted. No AAA noted.  LABORATORY DATA:    Latest Ref Rng & Units 01/05/2023   11:05 AM 01/27/2020   10:40 AM 07/22/2019    6:01 AM  CBC  WBC 4.0 - 10.5 K/uL  7.6  10.0   Hemoglobin 13.0 - 17.7 g/dL 13.8  15.6  14.9   Hematocrit 37.5 - 51.0 % 40.2  47.7  45.6   Platelets 150 - 400 K/uL  181  166        Latest Ref Rng & Units 01/05/2023   11:05 AM 04/23/2021    1:32 PM 01/27/2020   10:40 AM  CMP  Glucose 70 - 99 mg/dL 95  76  97   BUN 10 - 36 mg/dL 26  22  23   $ Creatinine 0.76 - 1.27 mg/dL 1.36  1.22  1.21   Sodium 134 - 144 mmol/L 146  146  138   Potassium 3.5 - 5.2 mmol/L  4.5  4.8  4.2   Chloride 96 - 106 mmol/L 105  102  99   CO2 20 - 29 mmol/L 27  28  31   $ Calcium 8.6 - 10.2 mg/dL 9.7  10.2  10.1   Total Protein 6.5 - 8.1 g/dL   7.0   Total Bilirubin 0.3 - 1.2 mg/dL   0.8   Alkaline Phos 38 - 126 U/L   85   AST 15 - 41 U/L   23   ALT 0 - 44 U/L   20     Lipid Panel  12/13/2018: Total cholesterol 173, HDL 53, LDL 93, triglycerides 137.  02/03/2021: Total cholesterol 155, triglycerides 88, HDL 55, LDL 83, non-HDL 100. Hemoglobin 14.8 g/dL, hematocrit 44.6% BUN 32, creatinine 1.22 g/dL, GFR 55. Sodium 144, potassium 4.1, chloride 106, bicarb 32, AST 16, ALT 16, alkaline phosphatase 79  External Labs: Collected: 07/20/2022 performed at [facility] clinical provided by the patient at today's visit. BUN 20.3, creatinine 0.98. BUN to creatinine ratio 20.7. Sodium 140, potassium 4.1, chloride 102, bicarb 31, AST 12, ALT 19, alkaline phosphatase 98   FINAL MEDICATION LIST END OF ENCOUNTER: Meds ordered this encounter  Medications   sacubitril-valsartan (ENTRESTO) 24-26 MG    Sig: Take 1 tablet by mouth 2 (two) times daily.    Dispense:  60 tablet    Refill:  0     Medications Discontinued During This Encounter  Medication Reason   hydrochlorothiazide (MICROZIDE) 12.5 MG capsule    INCRUSE ELLIPTA 62.5 MCG/ACT AEPB      Current Outpatient Medications:    acetaminophen (TYLENOL) 325 MG tablet, Take 650 mg by mouth every 6 (six) hours as needed., Disp: , Rfl:    calcium carbonate (TUMS CHEWY BITES) 750 MG chewable tablet, Chew 1 tablet by mouth as needed for heartburn., Disp: , Rfl:    dextromethorphan (DELSYM) 30 MG/5ML liquid, Take by mouth at bedtime as needed., Disp: , Rfl:    diltiazem (CARDIZEM CD) 120 MG 24 hr capsule, TAKE 1 CAPSULE BY MOUTH EVERY DAY, Disp: 90 capsule, Rfl: 0   DULoxetine (CYMBALTA) 20 MG capsule, Take 20 mg by mouth daily., Disp: , Rfl:  ELIQUIS 5 MG TABS tablet, Take 5 mg by mouth 2 (two) times daily., Disp: , Rfl:     furosemide (LASIX) 20 MG tablet, Take 20 mg by mouth daily. Tuesday, Wednesday, Thursday, Saturday, Disp: , Rfl:    furosemide (LASIX) 40 MG tablet, Take 40 mg by mouth daily. Every Monday and Friday, Disp: , Rfl:    guaiFENesin (MUCINEX) 600 MG 12 hr tablet, Take by mouth., Disp: , Rfl:    latanoprost (XALATAN) 0.005 % ophthalmic solution, INSTILL 1 DROP INTO BOTH EYES EVERY DAY AT NIGHT, Disp: , Rfl:    Multiple Vitamins-Minerals (PRESERVISION AREDS 2+MULTI VIT PO), Take 2 capsules by mouth daily., Disp: , Rfl:    Polyethyl Glycol-Propyl Glycol (SYSTANE) 0.4-0.3 % SOLN, See admin instructions., Disp: , Rfl:    polyethylene glycol (MIRALAX / GLYCOLAX) 17 g packet, 1 packet mixed with 8 ounces of fluid, Disp: , Rfl:    pregabalin (LYRICA) 150 MG capsule, Take 150 mg by mouth 2 (two) times daily., Disp: , Rfl:    sacubitril-valsartan (ENTRESTO) 24-26 MG, Take 1 tablet by mouth 2 (two) times daily., Disp: 60 tablet, Rfl: 0  IMPRESSION:    ICD-10-CM   1. Acute heart failure with preserved ejection fraction (HFpEF) (HCC)  I50.31 sacubitril-valsartan (ENTRESTO) 24-26 MG    Basic metabolic panel    Magnesium    Pro b natriuretic peptide (BNP)    2. Paroxysmal A-fib (HCC)  I48.0 EKG 12-Lead    3. Long term (current) use of anticoagulants  Z79.01     4. Benign hypertension with CKD (chronic kidney disease) stage III (HCC)  I12.9    N18.30     5. SOB (shortness of breath)  R06.02     6. PVC (premature ventricular contraction)  I49.3     7. Former smoker  Z87.891        RECOMMENDATIONS: Shane Vannelli Sr. is a 89 y.o. male whose past medical history and cardiac risk factors include: Paroxysmal atrial fibrillation, HFpEF, hypertension with chronic kidney disease stage III, chronic venous insufficiency, PVCs, advanced age.  Acute heart failure with preserved ejection fraction (HFpEF) (HCC) Acute on chronic exacerbation. Has gained approximately 25 pounds since last office visit. His  daughter states that baseline weight is around 173-175 pounds. Continue the current dose of Lasix. Will start Entresto 24/26 mg p.o. twice daily with labs in 1 week to reevaluate kidney function and electrolytes. Recommend bilateral compression stockings to help mobilize fluids. Strict I's and O's and daily weights. Reemphasized importance of low-salt diet. Would like to see him back in close follow-up to help facilitate diuresis.  Paroxysmal A-fib (HCC) Rate control: Diltiazem. Rhythm control: N/A. Thromboembolic prophylaxis: Eliquis. Diagnosed during his hospitalization at [facility] in August 2023. No history of prior GI bleeds or falls. CHA2DS2-VASc SCORE is 4 which correlates to 4% risk of stroke per year (HFpEF, HTN, age).   Long term (current) use of anticoagulants Indication: Paroxysmal atrial fibrillation. Hemoglobin as of February 2024 relatively stable at 13.8 g/dL. Reemphasized the risks, benefits, and alternatives to anticoagulation.  Benign hypertension with CKD (chronic kidney disease) stage III ([place]) Office blood pressures are currently not at goal. However, would like to utilize his blood pressures to help facilitate diuresis.  Medication changes as noted above  PVC (premature ventricular contraction) Asymptomatic. Continue diltiazem   Orders Placed This Encounter  Procedures   Basic metabolic panel   Magnesium   Pro b natriuretic peptide (BNP)   EKG 12-Lead   --Continue  cardiac medications as reconciled in final medication list. --Return in about 4 weeks (around 02/08/2023) for Follow up, heart failure management., A. fib. Or sooner if needed. --Continue follow-up with your primary care physician regarding the management of your other chronic comorbid conditions.  Patient's questions and concerns were addressed to his satisfaction. He voices understanding of the instructions provided during this encounter.   This note was created using a voice  recognition software as a result there may be grammatical errors inadvertently enclosed that do not reflect the nature of this encounter. Every attempt is made to correct such errors.  Rex Kras, [place], [facility]  Pager: [phone] Office: [phone]

## 2023-01-11 NOTE — Progress Notes (Signed)
Patient advised of Dr Brennan Bailey recommendation. Informed of upcoming appointment

## 2023-01-13 DIAGNOSIS — H4051X1 Glaucoma secondary to other eye disorders, right eye, mild stage: Secondary | ICD-10-CM | POA: Diagnosis not present

## 2023-01-13 DIAGNOSIS — H353211 Exudative age-related macular degeneration, right eye, with active choroidal neovascularization: Secondary | ICD-10-CM | POA: Diagnosis not present

## 2023-01-13 DIAGNOSIS — H353122 Nonexudative age-related macular degeneration, left eye, intermediate dry stage: Secondary | ICD-10-CM | POA: Diagnosis not present

## 2023-01-14 DIAGNOSIS — J9601 Acute respiratory failure with hypoxia: Secondary | ICD-10-CM | POA: Diagnosis not present

## 2023-01-14 DIAGNOSIS — K5900 Constipation, unspecified: Secondary | ICD-10-CM | POA: Diagnosis not present

## 2023-01-14 DIAGNOSIS — I4891 Unspecified atrial fibrillation: Secondary | ICD-10-CM | POA: Diagnosis not present

## 2023-01-14 DIAGNOSIS — G629 Polyneuropathy, unspecified: Secondary | ICD-10-CM | POA: Diagnosis not present

## 2023-01-14 DIAGNOSIS — N183 Chronic kidney disease, stage 3 unspecified: Secondary | ICD-10-CM | POA: Diagnosis not present

## 2023-01-14 DIAGNOSIS — Z9181 History of falling: Secondary | ICD-10-CM | POA: Diagnosis not present

## 2023-01-14 DIAGNOSIS — I129 Hypertensive chronic kidney disease with stage 1 through stage 4 chronic kidney disease, or unspecified chronic kidney disease: Secondary | ICD-10-CM | POA: Diagnosis not present

## 2023-01-14 DIAGNOSIS — Z96611 Presence of right artificial shoulder joint: Secondary | ICD-10-CM | POA: Diagnosis not present

## 2023-01-14 DIAGNOSIS — Z96612 Presence of left artificial shoulder joint: Secondary | ICD-10-CM | POA: Diagnosis not present

## 2023-01-14 DIAGNOSIS — Z7901 Long term (current) use of anticoagulants: Secondary | ICD-10-CM | POA: Diagnosis not present

## 2023-01-14 DIAGNOSIS — M1990 Unspecified osteoarthritis, unspecified site: Secondary | ICD-10-CM | POA: Diagnosis not present

## 2023-01-14 DIAGNOSIS — H3530 Unspecified macular degeneration: Secondary | ICD-10-CM | POA: Diagnosis not present

## 2023-01-14 DIAGNOSIS — J986 Disorders of diaphragm: Secondary | ICD-10-CM | POA: Diagnosis not present

## 2023-01-14 DIAGNOSIS — Z87891 Personal history of nicotine dependence: Secondary | ICD-10-CM | POA: Diagnosis not present

## 2023-01-14 DIAGNOSIS — H409 Unspecified glaucoma: Secondary | ICD-10-CM | POA: Diagnosis not present

## 2023-01-14 DIAGNOSIS — E785 Hyperlipidemia, unspecified: Secondary | ICD-10-CM | POA: Diagnosis not present

## 2023-01-18 ENCOUNTER — Telehealth: Payer: Self-pay

## 2023-01-18 ENCOUNTER — Other Ambulatory Visit: Payer: Self-pay

## 2023-01-18 DIAGNOSIS — R600 Localized edema: Secondary | ICD-10-CM | POA: Diagnosis not present

## 2023-01-18 DIAGNOSIS — G629 Polyneuropathy, unspecified: Secondary | ICD-10-CM | POA: Diagnosis not present

## 2023-01-18 DIAGNOSIS — I5031 Acute diastolic (congestive) heart failure: Secondary | ICD-10-CM

## 2023-01-18 NOTE — Telephone Encounter (Signed)
Patient daughter called to ask if labs had been ordered. Lab orders confirmed. Discussed with daughter Dr. Brennan Bailey recommendation of drinking more water daily. Patient will go Friday to have labs drawn. Patient wants to have daughter put on his DPR. Patient has given verbal permission.

## 2023-01-20 DIAGNOSIS — H409 Unspecified glaucoma: Secondary | ICD-10-CM | POA: Diagnosis not present

## 2023-01-20 DIAGNOSIS — Z87891 Personal history of nicotine dependence: Secondary | ICD-10-CM | POA: Diagnosis not present

## 2023-01-20 DIAGNOSIS — E785 Hyperlipidemia, unspecified: Secondary | ICD-10-CM | POA: Diagnosis not present

## 2023-01-20 DIAGNOSIS — J986 Disorders of diaphragm: Secondary | ICD-10-CM | POA: Diagnosis not present

## 2023-01-20 DIAGNOSIS — G629 Polyneuropathy, unspecified: Secondary | ICD-10-CM | POA: Diagnosis not present

## 2023-01-20 DIAGNOSIS — J9601 Acute respiratory failure with hypoxia: Secondary | ICD-10-CM | POA: Diagnosis not present

## 2023-01-20 DIAGNOSIS — K5900 Constipation, unspecified: Secondary | ICD-10-CM | POA: Diagnosis not present

## 2023-01-20 DIAGNOSIS — M1990 Unspecified osteoarthritis, unspecified site: Secondary | ICD-10-CM | POA: Diagnosis not present

## 2023-01-20 DIAGNOSIS — Z9181 History of falling: Secondary | ICD-10-CM | POA: Diagnosis not present

## 2023-01-20 DIAGNOSIS — I129 Hypertensive chronic kidney disease with stage 1 through stage 4 chronic kidney disease, or unspecified chronic kidney disease: Secondary | ICD-10-CM | POA: Diagnosis not present

## 2023-01-20 DIAGNOSIS — Z96611 Presence of right artificial shoulder joint: Secondary | ICD-10-CM | POA: Diagnosis not present

## 2023-01-20 DIAGNOSIS — Z96612 Presence of left artificial shoulder joint: Secondary | ICD-10-CM | POA: Diagnosis not present

## 2023-01-20 DIAGNOSIS — I4891 Unspecified atrial fibrillation: Secondary | ICD-10-CM | POA: Diagnosis not present

## 2023-01-20 DIAGNOSIS — H3530 Unspecified macular degeneration: Secondary | ICD-10-CM | POA: Diagnosis not present

## 2023-01-20 DIAGNOSIS — N183 Chronic kidney disease, stage 3 unspecified: Secondary | ICD-10-CM | POA: Diagnosis not present

## 2023-01-20 DIAGNOSIS — Z7901 Long term (current) use of anticoagulants: Secondary | ICD-10-CM | POA: Diagnosis not present

## 2023-01-21 DIAGNOSIS — I5031 Acute diastolic (congestive) heart failure: Secondary | ICD-10-CM | POA: Diagnosis not present

## 2023-01-22 LAB — MAGNESIUM: Magnesium: 2.3 mg/dL (ref 1.6–2.3)

## 2023-01-22 LAB — PRO B NATRIURETIC PEPTIDE: NT-Pro BNP: 190 pg/mL (ref 0–486)

## 2023-01-23 ENCOUNTER — Other Ambulatory Visit: Payer: Self-pay | Admitting: Cardiology

## 2023-01-23 DIAGNOSIS — I480 Paroxysmal atrial fibrillation: Secondary | ICD-10-CM

## 2023-01-23 DIAGNOSIS — Z7901 Long term (current) use of anticoagulants: Secondary | ICD-10-CM

## 2023-01-25 ENCOUNTER — Telehealth: Payer: Self-pay

## 2023-01-25 DIAGNOSIS — J986 Disorders of diaphragm: Secondary | ICD-10-CM | POA: Diagnosis not present

## 2023-01-25 DIAGNOSIS — Z9181 History of falling: Secondary | ICD-10-CM | POA: Diagnosis not present

## 2023-01-25 DIAGNOSIS — I4891 Unspecified atrial fibrillation: Secondary | ICD-10-CM | POA: Diagnosis not present

## 2023-01-25 DIAGNOSIS — I129 Hypertensive chronic kidney disease with stage 1 through stage 4 chronic kidney disease, or unspecified chronic kidney disease: Secondary | ICD-10-CM | POA: Diagnosis not present

## 2023-01-25 DIAGNOSIS — E785 Hyperlipidemia, unspecified: Secondary | ICD-10-CM | POA: Diagnosis not present

## 2023-01-25 DIAGNOSIS — Z96611 Presence of right artificial shoulder joint: Secondary | ICD-10-CM | POA: Diagnosis not present

## 2023-01-25 DIAGNOSIS — M1990 Unspecified osteoarthritis, unspecified site: Secondary | ICD-10-CM | POA: Diagnosis not present

## 2023-01-25 DIAGNOSIS — Z96612 Presence of left artificial shoulder joint: Secondary | ICD-10-CM | POA: Diagnosis not present

## 2023-01-25 DIAGNOSIS — N183 Chronic kidney disease, stage 3 unspecified: Secondary | ICD-10-CM | POA: Diagnosis not present

## 2023-01-25 DIAGNOSIS — Z7901 Long term (current) use of anticoagulants: Secondary | ICD-10-CM | POA: Diagnosis not present

## 2023-01-25 DIAGNOSIS — J9601 Acute respiratory failure with hypoxia: Secondary | ICD-10-CM | POA: Diagnosis not present

## 2023-01-25 DIAGNOSIS — G629 Polyneuropathy, unspecified: Secondary | ICD-10-CM | POA: Diagnosis not present

## 2023-01-25 DIAGNOSIS — H3530 Unspecified macular degeneration: Secondary | ICD-10-CM | POA: Diagnosis not present

## 2023-01-25 DIAGNOSIS — Z87891 Personal history of nicotine dependence: Secondary | ICD-10-CM | POA: Diagnosis not present

## 2023-01-25 DIAGNOSIS — H409 Unspecified glaucoma: Secondary | ICD-10-CM | POA: Diagnosis not present

## 2023-01-25 DIAGNOSIS — K5900 Constipation, unspecified: Secondary | ICD-10-CM | POA: Diagnosis not present

## 2023-01-25 NOTE — Telephone Encounter (Signed)
I have contacted [facility] 3 times on 02/26 and 02/27 concerning BMP that has not resulted ordered on 2/20. [facility] will reach back out with any information concerning this lab. I verified that labs ordered on the 25th have been received.

## 2023-01-26 NOTE — Telephone Encounter (Signed)
Contacted [facility] today following up on BMP. I was told that the sample cannot be found. I asked for a call back. Per [facility] there is not a number to directly contact the branch that receives the blood tubes only a email can be sent by internal personnel requesting that they call Provider's office. Another email has been sent.

## 2023-01-26 NOTE — Progress Notes (Signed)
Tammy has called, they said they would call us when they figure it out, after she called twice.

## 2023-02-01 DIAGNOSIS — G629 Polyneuropathy, unspecified: Secondary | ICD-10-CM | POA: Diagnosis not present

## 2023-02-01 DIAGNOSIS — Z96612 Presence of left artificial shoulder joint: Secondary | ICD-10-CM | POA: Diagnosis not present

## 2023-02-01 DIAGNOSIS — I10 Essential (primary) hypertension: Secondary | ICD-10-CM | POA: Diagnosis not present

## 2023-02-01 DIAGNOSIS — M1990 Unspecified osteoarthritis, unspecified site: Secondary | ICD-10-CM | POA: Diagnosis not present

## 2023-02-01 DIAGNOSIS — N183 Chronic kidney disease, stage 3 unspecified: Secondary | ICD-10-CM | POA: Diagnosis not present

## 2023-02-01 DIAGNOSIS — Z87891 Personal history of nicotine dependence: Secondary | ICD-10-CM | POA: Diagnosis not present

## 2023-02-01 DIAGNOSIS — J9601 Acute respiratory failure with hypoxia: Secondary | ICD-10-CM | POA: Diagnosis not present

## 2023-02-01 DIAGNOSIS — E785 Hyperlipidemia, unspecified: Secondary | ICD-10-CM | POA: Diagnosis not present

## 2023-02-01 DIAGNOSIS — K5900 Constipation, unspecified: Secondary | ICD-10-CM | POA: Diagnosis not present

## 2023-02-01 DIAGNOSIS — Z1152 Encounter for screening for COVID-19: Secondary | ICD-10-CM | POA: Diagnosis not present

## 2023-02-01 DIAGNOSIS — Z743 Need for continuous supervision: Secondary | ICD-10-CM | POA: Diagnosis not present

## 2023-02-01 DIAGNOSIS — J986 Disorders of diaphragm: Secondary | ICD-10-CM | POA: Diagnosis not present

## 2023-02-01 DIAGNOSIS — Z96611 Presence of right artificial shoulder joint: Secondary | ICD-10-CM | POA: Diagnosis not present

## 2023-02-01 DIAGNOSIS — Z9181 History of falling: Secondary | ICD-10-CM | POA: Diagnosis not present

## 2023-02-01 DIAGNOSIS — H409 Unspecified glaucoma: Secondary | ICD-10-CM | POA: Diagnosis not present

## 2023-02-01 DIAGNOSIS — I129 Hypertensive chronic kidney disease with stage 1 through stage 4 chronic kidney disease, or unspecified chronic kidney disease: Secondary | ICD-10-CM | POA: Diagnosis not present

## 2023-02-01 DIAGNOSIS — I4891 Unspecified atrial fibrillation: Secondary | ICD-10-CM | POA: Diagnosis not present

## 2023-02-01 DIAGNOSIS — Z7901 Long term (current) use of anticoagulants: Secondary | ICD-10-CM | POA: Diagnosis not present

## 2023-02-01 DIAGNOSIS — H3530 Unspecified macular degeneration: Secondary | ICD-10-CM | POA: Diagnosis not present

## 2023-02-01 LAB — BASIC METABOLIC PANEL

## 2023-02-02 ENCOUNTER — Other Ambulatory Visit: Payer: Self-pay

## 2023-02-02 ENCOUNTER — Telehealth: Payer: Self-pay

## 2023-02-02 DIAGNOSIS — Z7901 Long term (current) use of anticoagulants: Secondary | ICD-10-CM

## 2023-02-02 NOTE — Progress Notes (Signed)
External Labs: Collected: 02/01/2023 at [facility] available in [place]. Hemoglobin 14.1, hematocrit 43.7% Sodium 144, potassium 4, chloride 106, bicarb 29. BUN 34, creatinine 1.34. AST 18, ALT 11, alkaline phosphatase 103 eGFR 49

## 2023-02-02 NOTE — Telephone Encounter (Signed)
Spoke with daughter concerning BMP being lost at [place]. She will take dad back and have it drawn again.

## 2023-02-02 NOTE — Telephone Encounter (Signed)
Left message with patient daughter reminding her that Dad needs to have labs drawn at least 2 days before his appointment on 02/11/23

## 2023-02-03 DIAGNOSIS — F32A Depression, unspecified: Secondary | ICD-10-CM | POA: Diagnosis not present

## 2023-02-03 DIAGNOSIS — G3184 Mild cognitive impairment, so stated: Secondary | ICD-10-CM | POA: Diagnosis not present

## 2023-02-07 ENCOUNTER — Telehealth: Payer: Self-pay

## 2023-02-07 DIAGNOSIS — J9601 Acute respiratory failure with hypoxia: Secondary | ICD-10-CM | POA: Diagnosis not present

## 2023-02-07 DIAGNOSIS — Z9981 Dependence on supplemental oxygen: Secondary | ICD-10-CM | POA: Diagnosis not present

## 2023-02-07 DIAGNOSIS — Z9181 History of falling: Secondary | ICD-10-CM | POA: Diagnosis not present

## 2023-02-07 DIAGNOSIS — M6281 Muscle weakness (generalized): Secondary | ICD-10-CM | POA: Diagnosis not present

## 2023-02-07 NOTE — Telephone Encounter (Signed)
Patient's daughter called and stated that patient was in the hospital on the 5th and had a CMP drawn and wants to know if he (patient) needs to still go to [facility] to have BMP done. Please advise.   Daughter Johnell Comings [phone]

## 2023-02-07 NOTE — Telephone Encounter (Signed)
Yes, please have him get a BNP and BMP 24 hours prior to his office visit. Order and release labs. And update the patient's daughter.  Yuridia Couts [place], DO, [facility]

## 2023-02-08 ENCOUNTER — Other Ambulatory Visit: Payer: Self-pay

## 2023-02-08 DIAGNOSIS — I5031 Acute diastolic (congestive) heart failure: Secondary | ICD-10-CM

## 2023-02-08 NOTE — Telephone Encounter (Signed)
Called the patients daughter and let her know about these labs, she agreed to take him on Thursday since his appt is on Friday.

## 2023-02-10 ENCOUNTER — Other Ambulatory Visit: Payer: Self-pay

## 2023-02-10 DIAGNOSIS — Z7901 Long term (current) use of anticoagulants: Secondary | ICD-10-CM | POA: Diagnosis not present

## 2023-02-10 DIAGNOSIS — I5031 Acute diastolic (congestive) heart failure: Secondary | ICD-10-CM

## 2023-02-11 ENCOUNTER — Ambulatory Visit: Payer: Medicare Other | Admitting: Cardiology

## 2023-02-11 ENCOUNTER — Encounter: Payer: Self-pay | Admitting: Cardiology

## 2023-02-11 ENCOUNTER — Other Ambulatory Visit: Payer: Self-pay | Admitting: Cardiology

## 2023-02-11 VITALS — BP 132/60 | HR 65 | Resp 16 | Ht 68.0 in | Wt 180.0 lb

## 2023-02-11 DIAGNOSIS — I493 Ventricular premature depolarization: Secondary | ICD-10-CM | POA: Diagnosis not present

## 2023-02-11 DIAGNOSIS — N183 Chronic kidney disease, stage 3 unspecified: Secondary | ICD-10-CM | POA: Diagnosis not present

## 2023-02-11 DIAGNOSIS — Z7901 Long term (current) use of anticoagulants: Secondary | ICD-10-CM | POA: Diagnosis not present

## 2023-02-11 DIAGNOSIS — I5031 Acute diastolic (congestive) heart failure: Secondary | ICD-10-CM | POA: Diagnosis not present

## 2023-02-11 DIAGNOSIS — I480 Paroxysmal atrial fibrillation: Secondary | ICD-10-CM | POA: Diagnosis not present

## 2023-02-11 DIAGNOSIS — I129 Hypertensive chronic kidney disease with stage 1 through stage 4 chronic kidney disease, or unspecified chronic kidney disease: Secondary | ICD-10-CM

## 2023-02-11 DIAGNOSIS — Z87891 Personal history of nicotine dependence: Secondary | ICD-10-CM | POA: Diagnosis not present

## 2023-02-11 LAB — BASIC METABOLIC PANEL
BUN/Creatinine Ratio: 19 (ref 10–24)
BUN: 25 mg/dL (ref 10–36)
CO2: 25 mmol/L (ref 20–29)
Calcium: 9.2 mg/dL (ref 8.6–10.2)
Chloride: 102 mmol/L (ref 96–106)
Creatinine, Ser: 1.33 mg/dL — ABNORMAL HIGH (ref 0.76–1.27)
Glucose: 148 mg/dL — ABNORMAL HIGH (ref 70–99)
Potassium: 3.7 mmol/L (ref 3.5–5.2)
Sodium: 141 mmol/L (ref 134–144)
eGFR: 49 mL/min/{1.73_m2} — ABNORMAL LOW (ref >59)

## 2023-02-11 LAB — BRAIN NATRIURETIC PEPTIDE: BNP: 32.4 pg/mL (ref 0.0–100.0)

## 2023-02-11 MED ORDER — BUMETANIDE 0.5 MG PO TABS: 0.5000 mg | ORAL_TABLET | ORAL | 0 refills | Status: DC

## 2023-02-11 MED ORDER — DAPAGLIFLOZIN PROPANEDIOL 10 MG PO TABS: 10.0000 mg | ORAL_TABLET | Freq: Every day | ORAL | 0 refills | Status: AC

## 2023-02-11 NOTE — Progress Notes (Signed)
Date: 02/11/23 Last Office Visit: 01/11/2023  Chief Complaint  Patient presents with   Congestive Heart Failure   Follow-up   Atrial Fibrillation    4 week    HPI  Shane Streff Sr. is a 89 y.o. male whose past medical history and cardiac risk factors include: Paroxysmal atrial fibrillation, HFpEF, hypertension with chronic kidney disease stage III, chronic venous insufficiency, PVCs, advanced age.  Patient is being followed by the practice for HFpEF and atrial fibrillation management.  During his last office visit in February 2024 he had approximately gained 25 pounds compared to his prior office weight and he had volume retention in bilateral lower extremities up to mid shin.  His home weights are usually between 173-175 pounds according to his daughter.  At the last office visit started him on Entresto 24/26 mg p.o. twice daily.  He is tolerated it well.  Slight increase in renal function secondary to initiation of Entresto.  At his nursing facility he is currently getting 40 mg of Lasix Mondays and Fridays and 20 mg remaining of the days.  Since last office visit he is lost 2 pounds and he does make a conscious effort with regards to following a low-salt diet.    Most recent labs independently reviewed and noted below for further reference.  ALLERGIES: Allergies  Allergen Reactions   Tape Rash   MEDICATION LIST PRIOR TO VISIT: Current Outpatient Medications on File Prior to Visit  Medication Sig Dispense Refill   acetaminophen (TYLENOL) 325 MG tablet Take 650 mg by mouth every 6 (six) hours as needed.     calcium carbonate (TUMS CHEWY BITES) 750 MG chewable tablet Chew 1 tablet by mouth as needed for heartburn.     dextromethorphan (DELSYM) 30 MG/5ML liquid Take by mouth at bedtime as needed.     diltiazem (CARDIZEM CD) 120 MG 24 hr capsule TAKE 1 CAPSULE BY MOUTH EVERY DAY 90 capsule 0   DULoxetine (CYMBALTA) 20 MG capsule Take 20 mg by mouth daily.     ELIQUIS 5  MG TABS tablet Take 5 mg by mouth 2 (two) times daily.     guaiFENesin (MUCINEX) 600 MG 12 hr tablet Take by mouth.     latanoprost (XALATAN) 0.005 % ophthalmic solution INSTILL 1 DROP INTO BOTH EYES EVERY DAY AT NIGHT     Multiple Vitamins-Minerals (PRESERVISION AREDS 2+MULTI VIT PO) Take 2 capsules by mouth daily.     Polyethyl Glycol-Propyl Glycol (SYSTANE) 0.4-0.3 % SOLN See admin instructions.     polyethylene glycol (MIRALAX / GLYCOLAX) 17 g packet 1 packet mixed with 8 ounces of fluid     pregabalin (LYRICA) 150 MG capsule Take 150 mg by mouth 2 (two) times daily.     No current facility-administered medications on file prior to visit.    PAST MEDICAL HISTORY: Past Medical History:  Diagnosis Date   (HFpEF) heart failure with preserved ejection fraction (HCC)    AAA (abdominal aortic aneurysm) (HCC)    Chronic kidney disease    Glaucoma    Hyperlipemia    Hypertension    Paroxysmal atrial fibrillation (HCC)    PVC (premature ventricular contraction)     PAST SURGICAL HISTORY: Past Surgical History:  Procedure Laterality Date   APPENDECTOMY     EYE SURGERY     lens implants   HERNIA REPAIR     TENNIS ELBOW RELEASE/NIRSCHEL PROCEDURE     TONSILLECTOMY     TOTAL SHOULDER REPLACEMENT Bilateral  FAMILY HISTORY: The patient family history is not on file.   SOCIAL HISTORY:  The patient  reports that he quit smoking about 63 years ago. His smoking use included cigarettes. He has a 30.00 pack-year smoking history. He has never used smokeless tobacco. He reports current alcohol use. He reports that he does not use drugs.  Review of Systems  Cardiovascular:  Negative for chest pain, claudication, dyspnea on exertion, irregular heartbeat, leg swelling, near-syncope, orthopnea, palpitations, paroxysmal nocturnal dyspnea and syncope.  Respiratory:  Negative for shortness of breath.   Hematologic/Lymphatic: Negative for bleeding problem.  Musculoskeletal:  Negative for muscle  cramps and myalgias.  Neurological:  Negative for dizziness and light-headedness.    PHYSICAL EXAM:    02/11/2023   11:56 AM 01/11/2023   10:17 AM 01/11/2023   10:10 AM  Vitals with BMI  Height 5\' 8"   5\' 8"   Weight 180 lbs  182 lbs 13 oz  BMI [id number]  0000000  Systolic [id number] 0000000 [id number]  Diastolic 60 80 87  Pulse 65  82   Physical Exam  Constitutional: No distress.  Appears older than stated age, ambulates w/ walker, hemodynamically stable.   Neck: No JVD present.  Cardiovascular: Normal rate, regular rhythm, S1 normal, S2 normal, intact distal pulses and normal pulses. Exam reveals no gallop, no S3 and no S4.  Murmur heard. Holosystolic murmur is present with a grade of 3/6 at the lower left sternal border. Pulmonary/Chest: Effort normal and breath sounds normal. No stridor. He has no wheezes. He has no rales.  Abdominal: Soft. Bowel sounds are normal. He exhibits no distension. There is no abdominal tenderness.  Musculoskeletal:        General: Edema present.     Cervical back: Neck supple.  Neurological: He is alert and oriented to person, place, and time. He has intact cranial nerves (2-12).  Skin: Skin is warm and moist.   CARDIAC DATABASE: EKG: 03/27/2021: Normal sinus rhythm, 86 bpm, first-degree AV block, right bundle branch block, left axis deviation, left anterior fascicular block, frequent PVCs.  05/27/2022: Normal sinus rhythm, 71 bpm, left axis, first-degree AV block, right bundle branch block, ST-T changes likely secondary RBBB.   07/27/22 Sinus Bradycardia, 58bpm, First degree A-V block, RBBB.   01/11/2023: Sinus rhythm, 77 bpm, first-degree AV block, right bundle branch block, left axis, left anterior fascicular block.  Echocardiogram: 04/07/2021:  Left ventricle cavity is normal in size. Moderate concentric hypertrophy of the left ventricle. Normal global wall motion. Normal LV systolic function with EF 60%. Doppler evidence of grade I (impaired) diastolic dysfunction,  normal LAP.  Right ventricle cavity is mildly dilated.  Normal right ventricular function.  Moderate tricuspid regurgitation. Peak RA-RV gradient 27 mmHg.  Mild pulmonic regurgitation.  IVC not seen.  No significant change compared to previous study in 2019.   07/04/2022 @ [facility]:  Left Ventricle: Systolic function is low normal. EF: 50-55%. Ejection fraction measured by 3D is 50%,    Left Ventricle: Doppler parameters consistent with mild diastolic dysfunction and low to normal LA pressure.    Right Atrium: Right atrium is mildly to moderately dilated.    Aortic Valve: There is no regurgitation or stenosis.    Mitral Valve: There is moderate regurgitation.    Tricuspid Valve: There is moderate regurgitation.    Tricuspid Valve: The right ventricular systolic pressure is moderately elevated (50-59 mmHg).  Carotid artery duplex 07/27/2017: No hemodynamically significant arterial disease in the internal carotid artery bilaterally. Minimal soft  plaque noted. Antegrade right vertebral artery flow. Antegrade left vertebral artery flow.  Ultrasound abdominal aorta:  12/18/2018: The maximum aorta diameter is 2.1 cm (mid). No evidence of atherosclerotic plaque. Normal flow velocities noted. No AAA noted.  LABORATORY DATA:    Latest Ref Rng & Units 01/05/2023   11:05 AM 01/27/2020   10:40 AM 07/22/2019    6:01 AM  CBC  WBC 4.0 - 10.5 K/uL  7.6  10.0   Hemoglobin 13.0 - 17.7 g/dL 13.8  15.6  14.9   Hematocrit 37.5 - 51.0 % 40.2  47.7  45.6   Platelets 150 - 400 K/uL  181  166        Latest Ref Rng & Units 02/10/2023    9:41 AM 01/21/2023   10:48 AM 01/05/2023   11:05 AM  CMP  Glucose 70 - 99 mg/dL 148  CANCELED  95   BUN 10 - 36 mg/dL 25  CANCELED  26   Creatinine 0.76 - 1.27 mg/dL 1.33  CANCELED  1.36   Sodium 134 - 144 mmol/L 141  CANCELED  146   Potassium 3.5 - 5.2 mmol/L 3.7  CANCELED  4.5   Chloride 96 - 106 mmol/L 102  CANCELED  105   CO2 20 - 29 mmol/L 25  CANCELED  27    Calcium 8.6 - 10.2 mg/dL 9.2  CANCELED  9.7     Lipid Panel  12/13/2018: Total cholesterol 173, HDL 53, LDL 93, triglycerides 137.  02/03/2021: Total cholesterol 155, triglycerides 88, HDL 55, LDL 83, non-HDL 100. Hemoglobin 14.8 g/dL, hematocrit 44.6% BUN 32, creatinine 1.22 g/dL, GFR 55. Sodium 144, potassium 4.1, chloride 106, bicarb 32, AST 16, ALT 16, alkaline phosphatase 79  External Labs: Collected: 07/20/2022 performed at [facility] clinical provided by the patient at today's visit. BUN 20.3, creatinine 0.98. BUN to creatinine ratio 20.7. Sodium 140, potassium 4.1, chloride 102, bicarb 31, AST 12, ALT 19, alkaline phosphatase 98   FINAL MEDICATION LIST END OF ENCOUNTER: Meds ordered this encounter  Medications   bumetanide (BUMEX) 0.5 MG tablet    Sig: Take 1 tablet (0.5 mg total) by mouth every morning.    Dispense:  30 tablet    Refill:  0   dapagliflozin propanediol (FARXIGA) 10 MG TABS tablet    Sig: Take 1 tablet (10 mg total) by mouth daily before breakfast.    Dispense:  30 tablet    Refill:  0     Medications Discontinued During This Encounter  Medication Reason   furosemide (LASIX) 20 MG tablet    furosemide (LASIX) 40 MG tablet      Current Outpatient Medications:    acetaminophen (TYLENOL) 325 MG tablet, Take 650 mg by mouth every 6 (six) hours as needed., Disp: , Rfl:    bumetanide (BUMEX) 0.5 MG tablet, Take 1 tablet (0.5 mg total) by mouth every morning., Disp: 30 tablet, Rfl: 0   calcium carbonate (TUMS CHEWY BITES) 750 MG chewable tablet, Chew 1 tablet by mouth as needed for heartburn., Disp: , Rfl:    dapagliflozin propanediol (FARXIGA) 10 MG TABS tablet, Take 1 tablet (10 mg total) by mouth daily before breakfast., Disp: 30 tablet, Rfl: 0   dextromethorphan (DELSYM) 30 MG/5ML liquid, Take by mouth at bedtime as needed., Disp: , Rfl:    diltiazem (CARDIZEM CD) 120 MG 24 hr capsule, TAKE 1 CAPSULE BY MOUTH EVERY DAY, Disp: 90 capsule, Rfl: 0   DULoxetine  (CYMBALTA) 20 MG capsule, Take 20 mg  by mouth daily., Disp: , Rfl:    ELIQUIS 5 MG TABS tablet, Take 5 mg by mouth 2 (two) times daily., Disp: , Rfl:    guaiFENesin (MUCINEX) 600 MG 12 hr tablet, Take by mouth., Disp: , Rfl:    latanoprost (XALATAN) 0.005 % ophthalmic solution, INSTILL 1 DROP INTO BOTH EYES EVERY DAY AT NIGHT, Disp: , Rfl:    Multiple Vitamins-Minerals (PRESERVISION AREDS 2+MULTI VIT PO), Take 2 capsules by mouth daily., Disp: , Rfl:    Polyethyl Glycol-Propyl Glycol (SYSTANE) 0.4-0.3 % SOLN, See admin instructions., Disp: , Rfl:    polyethylene glycol (MIRALAX / GLYCOLAX) 17 g packet, 1 packet mixed with 8 ounces of fluid, Disp: , Rfl:    pregabalin (LYRICA) 150 MG capsule, Take 150 mg by mouth 2 (two) times daily., Disp: , Rfl:   IMPRESSION:    ICD-10-CM   1. Acute heart failure with preserved ejection fraction (HFpEF) (HCC)  I50.31 bumetanide (BUMEX) 0.5 MG tablet    dapagliflozin propanediol (FARXIGA) 10 MG TABS tablet    Pro b natriuretic peptide (BNP)    Basic metabolic panel    Magnesium    2. Long term (current) use of anticoagulants  Z79.01     3. Paroxysmal A-fib (HCC)  I48.0     4. Benign hypertension with CKD (chronic kidney disease) stage III (HCC)  I12.9    N18.30     5. PVC (premature ventricular contraction)  I49.3     6. Former smoker  Z87.891        RECOMMENDATIONS: Shane Waldera Sr. is a 89 y.o. male whose past medical history and cardiac risk factors include: Paroxysmal atrial fibrillation, HFpEF, hypertension with chronic kidney disease stage III, chronic venous insufficiency, PVCs, advanced age.  Acute heart failure with preserved ejection fraction (HFpEF) (HCC) Acute on chronic exacerbation. Has gained approximately 23 pounds since August 2023 His daughter states that baseline weight is around 173-175 pounds. Discontinue Lasix. Start Bumex 0.5 mg p.o. every morning. Start Farxiga 10 mg p.o. every morning. Continue  Entresto. Continue bilateral compression stockings to help mobilize fluid. Reemphasized importance of a low-salt diet and daily weights Labs in 1 week after starting Bumex and [place].  Paroxysmal A-fib (HCC) Rate control: Diltiazem. Rhythm control: N/A. Thromboembolic prophylaxis: Eliquis. Diagnosed during his hospitalization at [facility] in August 2023. No history of prior GI bleeds or falls. CHA2DS2-VASc SCORE is 4 which correlates to 4% risk of stroke per year (HFpEF, HTN, age).   Long term (current) use of anticoagulants Indication: Paroxysmal atrial fibrillation. Hemoglobin as of February 2024 relatively stable at 13.8 g/dL. Reemphasized the risks, benefits, and alternatives to anticoagulation.  Benign hypertension with CKD (chronic kidney disease) stage III ([place]) Office blood pressures are currently not at goal. However, would like to utilize his blood pressures to help facilitate diuresis.  Medication changes as noted above  PVC (premature ventricular contraction) Asymptomatic. Continue diltiazem   Orders Placed This Encounter  Procedures   Pro b natriuretic peptide (BNP)   Basic metabolic panel   Magnesium   --Continue cardiac medications as reconciled in final medication list. --Return in about 6 weeks (around 03/25/2023) for Follow up, heart failure management.. Or sooner if needed. --Continue follow-up with your primary care physician regarding the management of your other chronic comorbid conditions.  Patient's questions and concerns were addressed to his satisfaction. He voices understanding of the instructions provided during this encounter.   This note was created using a voice recognition software as a  result there may be grammatical errors inadvertently enclosed that do not reflect the nature of this encounter. Every attempt is made to correct such errors.  Rex Kras, [place], [facility]  Pager:  [phone] Office: [phone]

## 2023-02-14 ENCOUNTER — Telehealth: Payer: Self-pay

## 2023-02-14 NOTE — Telephone Encounter (Signed)
Miranda, the nurse from [place] facility is calling to let you know that the patient has received Lasix, Bumex, and Farxiga since saturday. She says you D/C'd the Lasix but their pharmacy didn't actually discontinue it so he has received all three since Saturday.   [phone] (Miranda's cell phone call back number)

## 2023-02-15 ENCOUNTER — Other Ambulatory Visit: Payer: Self-pay

## 2023-02-15 DIAGNOSIS — I5031 Acute diastolic (congestive) heart failure: Secondary | ICD-10-CM

## 2023-02-16 NOTE — Telephone Encounter (Signed)
We spoke about this couple days ago.  Please make sure the changes have gone over correctly.  Labs 1 week after starting Bumex and [place] as originally planned.   Dr. Terri Skains

## 2023-02-21 DIAGNOSIS — I5031 Acute diastolic (congestive) heart failure: Secondary | ICD-10-CM | POA: Diagnosis not present

## 2023-02-22 LAB — PRO B NATRIURETIC PEPTIDE: NT-Pro BNP: 123 pg/mL (ref 0–486)

## 2023-02-22 LAB — BASIC METABOLIC PANEL
BUN/Creatinine Ratio: 24 (ref 10–24)
BUN: 38 mg/dL — ABNORMAL HIGH (ref 10–36)
CO2: 27 mmol/L (ref 20–29)
Calcium: 9.6 mg/dL (ref 8.6–10.2)
Chloride: 102 mmol/L (ref 96–106)
Creatinine, Ser: 1.6 mg/dL — ABNORMAL HIGH (ref 0.76–1.27)
Glucose: 97 mg/dL (ref 70–99)
Potassium: 4.3 mmol/L (ref 3.5–5.2)
Sodium: 145 mmol/L — ABNORMAL HIGH (ref 134–144)
eGFR: 39 mL/min/{1.73_m2} — ABNORMAL LOW (ref >59)

## 2023-02-22 LAB — MAGNESIUM: Magnesium: 2.5 mg/dL — ABNORMAL HIGH (ref 1.6–2.3)

## 2023-02-24 ENCOUNTER — Other Ambulatory Visit: Payer: Self-pay

## 2023-02-24 DIAGNOSIS — I5031 Acute diastolic (congestive) heart failure: Secondary | ICD-10-CM

## 2023-02-24 NOTE — Progress Notes (Signed)
Called Mirandas number from last time I spoke with her and she did not answer, and VM was not set up.

## 2023-02-24 NOTE — Progress Notes (Signed)
Have already ordered and released labs.

## 2023-03-03 DIAGNOSIS — G3184 Mild cognitive impairment, so stated: Secondary | ICD-10-CM | POA: Diagnosis not present

## 2023-03-08 DIAGNOSIS — N1831 Chronic kidney disease, stage 3a: Secondary | ICD-10-CM | POA: Diagnosis not present

## 2023-03-08 DIAGNOSIS — M79672 Pain in left foot: Secondary | ICD-10-CM | POA: Diagnosis not present

## 2023-03-08 DIAGNOSIS — I480 Paroxysmal atrial fibrillation: Secondary | ICD-10-CM | POA: Diagnosis not present

## 2023-03-10 DIAGNOSIS — Z9981 Dependence on supplemental oxygen: Secondary | ICD-10-CM | POA: Diagnosis not present

## 2023-03-10 DIAGNOSIS — J9601 Acute respiratory failure with hypoxia: Secondary | ICD-10-CM | POA: Diagnosis not present

## 2023-03-10 DIAGNOSIS — M6281 Muscle weakness (generalized): Secondary | ICD-10-CM | POA: Diagnosis not present

## 2023-03-10 DIAGNOSIS — M79672 Pain in left foot: Secondary | ICD-10-CM | POA: Diagnosis not present

## 2023-03-10 DIAGNOSIS — Z9181 History of falling: Secondary | ICD-10-CM | POA: Diagnosis not present

## 2023-03-15 DIAGNOSIS — M79672 Pain in left foot: Secondary | ICD-10-CM | POA: Diagnosis not present

## 2023-03-25 ENCOUNTER — Ambulatory Visit: Payer: Medicare Other | Admitting: Cardiology

## 2023-03-25 ENCOUNTER — Encounter: Payer: Self-pay | Admitting: Cardiology

## 2023-03-25 VITALS — BP 123/45 | Resp 16 | Ht 68.0 in | Wt 184.2 lb

## 2023-03-25 DIAGNOSIS — I493 Ventricular premature depolarization: Secondary | ICD-10-CM

## 2023-03-25 DIAGNOSIS — I129 Hypertensive chronic kidney disease with stage 1 through stage 4 chronic kidney disease, or unspecified chronic kidney disease: Secondary | ICD-10-CM | POA: Diagnosis not present

## 2023-03-25 DIAGNOSIS — Z7901 Long term (current) use of anticoagulants: Secondary | ICD-10-CM

## 2023-03-25 DIAGNOSIS — Z87891 Personal history of nicotine dependence: Secondary | ICD-10-CM

## 2023-03-25 DIAGNOSIS — N183 Chronic kidney disease, stage 3 unspecified: Secondary | ICD-10-CM

## 2023-03-25 DIAGNOSIS — I480 Paroxysmal atrial fibrillation: Secondary | ICD-10-CM

## 2023-03-25 DIAGNOSIS — I5031 Acute diastolic (congestive) heart failure: Secondary | ICD-10-CM | POA: Diagnosis not present

## 2023-03-25 NOTE — Progress Notes (Signed)
Date: 03/25/23 Last Office Visit: 02/11/2023  Chief Complaint  Patient presents with   Heart failure management   Follow-up    6 weeks    HPI  Shane Travelstead Sr. is a 89 y.o. male whose past medical history and cardiac risk factors include: Paroxysmal atrial fibrillation, HFpEF, hypertension with chronic kidney disease stage III, chronic venous insufficiency, PVCs, advanced age.  Referred to the practice for evaluation of HFpEF and atrial fibrillation.  When he presented to the office in February 2024 he had gained approximately 25 pounds.  He was started on Entresto at the last visit Shane Knight was initiated and Lasix was transitioned to Bumex.  According to the patient's daughter is home weights usually run between 173-175 pounds.  Labs from March 2024 after initiating Bumex and Shane Knight he was noted acute kidney injury.  However his weights at this facility were 176 pounds.  Shared decision at that time was to give Bumex on as needed basis for weight more than 178 pounds and to have labs in 1 week to reevaluate renal function.  However those labs are still pending.  Based on the records provided by his SNF his blood pressures are around 120 mmHg on visual estimation.  And his weight beginning of April 2024 was around 177 pounds in the most recent weight as of yesterday was 182 pounds.  He denies orthopnea or PND but has a bilateral lower extremity swelling extending up to the upper knee.  He is wearing compression stockings.  Unfortunately at his SNF he has no control over the salt content in the foods which may be contributing to fluid retention.  Patient and daughter verbalized that he may be considering de-escalation in care.   ALLERGIES: Allergies  Allergen Reactions   Tape Rash   MEDICATION LIST PRIOR TO VISIT: Current Outpatient Medications on File Prior to Visit  Medication Sig Dispense Refill   acetaminophen (TYLENOL) 325 MG tablet Take 650 mg by mouth every 6  (six) hours as needed.     bumetanide (BUMEX) 0.5 MG tablet TAKE 1 TABLET BY MOUTH EVERY MORNING. 90 tablet 1   calcium carbonate (TUMS CHEWY BITES) 750 MG chewable tablet Chew 1 tablet by mouth as needed for heartburn.     dapagliflozin propanediol (FARXIGA) 10 MG TABS tablet Take 10 mg by mouth daily.     dextromethorphan (DELSYM) 30 MG/5ML liquid Take by mouth at bedtime as needed.     diltiazem (CARDIZEM CD) 120 MG 24 hr capsule TAKE 1 CAPSULE BY MOUTH EVERY DAY 90 capsule 0   DULoxetine (CYMBALTA) 20 MG capsule Take 20 mg by mouth daily.     ELIQUIS 5 MG TABS tablet Take 5 mg by mouth 2 (two) times daily.     ENTRESTO 24-26 MG Take 1 tablet by mouth 2 (two) times daily.     guaiFENesin (MUCINEX) 600 MG 12 hr tablet Take by mouth.     latanoprost (XALATAN) 0.005 % ophthalmic solution INSTILL 1 DROP INTO BOTH EYES EVERY DAY AT NIGHT     Multiple Vitamins-Minerals (PRESERVISION AREDS 2+MULTI VIT PO) Take 2 capsules by mouth daily.     Polyethyl Glycol-Propyl Glycol (SYSTANE) 0.4-0.3 % SOLN See admin instructions.     polyethylene glycol (MIRALAX / GLYCOLAX) 17 g packet 1 packet mixed with 8 ounces of fluid     pregabalin (LYRICA) 150 MG capsule Take 150 mg by mouth 2 (two) times daily.     No current facility-administered medications on file  prior to visit.    PAST MEDICAL HISTORY: Past Medical History:  Diagnosis Date   (HFpEF) heart failure with preserved ejection fraction (HCC)    AAA (abdominal aortic aneurysm) (HCC)    Chronic kidney disease    Glaucoma    Hyperlipemia    Hypertension    Paroxysmal atrial fibrillation (HCC)    PVC (premature ventricular contraction)     PAST SURGICAL HISTORY: Past Surgical History:  Procedure Laterality Date   APPENDECTOMY     EYE SURGERY     lens implants   HERNIA REPAIR     TENNIS ELBOW RELEASE/NIRSCHEL PROCEDURE     TONSILLECTOMY     TOTAL SHOULDER REPLACEMENT Bilateral     FAMILY HISTORY: The patient family history is not on  file.   SOCIAL HISTORY:  The patient  reports that he quit smoking about 63 years ago. His smoking use included cigarettes. He has a 30.00 pack-year smoking history. He has never used smokeless tobacco. He reports current alcohol use. He reports that he does not use drugs.  Review of Systems  Cardiovascular:  Negative for chest pain, claudication, dyspnea on exertion, irregular heartbeat, leg swelling, near-syncope, orthopnea, palpitations, paroxysmal nocturnal dyspnea and syncope.  Respiratory:  Negative for shortness of breath.   Hematologic/Lymphatic: Negative for bleeding problem.  Musculoskeletal:  Negative for muscle cramps and myalgias.  Neurological:  Negative for dizziness and light-headedness.    PHYSICAL EXAM:    03/25/2023    9:39 AM 02/11/2023   11:56 AM 01/11/2023   10:17 AM  Vitals with BMI  Height 5\' 8"  5\' 8"    Weight 184 lbs 3 oz 180 lbs   BMI 28.01 27.38   Systolic 123 132 161  Diastolic 45 60 80  Pulse  65    Physical Exam  Constitutional: No distress.  Appears older than stated age, ambulates w/ walker, hemodynamically stable.   Neck: No JVD present.  Cardiovascular: Normal rate, regular rhythm, S1 normal, S2 normal, intact distal pulses and normal pulses. Exam reveals no gallop, no S3 and no S4.  Murmur heard. Holosystolic murmur is present with a grade of 3/6 at the lower left sternal border. Pulmonary/Chest: Effort normal and breath sounds normal. No stridor. He has no wheezes. He has no rales.  Abdominal: Soft. Bowel sounds are normal. He exhibits no distension. There is no abdominal tenderness.  Musculoskeletal:        General: Edema (Bilateral lower extremities up to the knees) present.     Cervical back: Neck supple.  Neurological: He is alert and oriented to person, place, and time. He has intact cranial nerves (2-12).  Skin: Skin is warm and moist.   CARDIAC DATABASE: EKG: 03/27/2021: Normal sinus rhythm, 86 bpm, first-degree AV block, right bundle  branch block, left axis deviation, left anterior fascicular block, frequent PVCs.  05/27/2022: Normal sinus rhythm, 71 bpm, left axis, first-degree AV block, right bundle branch block, ST-T changes likely secondary RBBB.   07/27/22 Sinus Bradycardia, 58bpm, First degree A-V block, RBBB.   01/11/2023: Sinus rhythm, 77 bpm, first-degree AV block, right bundle branch block, left axis, left anterior fascicular block.  Echocardiogram: 04/07/2021:  Left ventricle cavity is normal in size. Moderate concentric hypertrophy of the left ventricle. Normal global wall motion. Normal LV systolic function with EF 60%. Doppler evidence of grade I (impaired) diastolic dysfunction, normal LAP.  Right ventricle cavity is mildly dilated.  Normal right ventricular function.  Moderate tricuspid regurgitation. Peak RA-RV gradient 27 mmHg.  Mild  pulmonic regurgitation.  IVC not seen.  No significant change compared to previous study in 2019.   07/04/2022 @ [facility]:  Left Ventricle: Systolic function is low normal. EF: 50-55%. Ejection fraction measured by 3D is 50%,    Left Ventricle: Doppler parameters consistent with mild diastolic dysfunction and low to normal LA pressure.    Right Atrium: Right atrium is mildly to moderately dilated.    Aortic Valve: There is no regurgitation or stenosis.    Mitral Valve: There is moderate regurgitation.    Tricuspid Valve: There is moderate regurgitation.    Tricuspid Valve: The right ventricular systolic pressure is moderately elevated (50-59 mmHg).  Carotid artery duplex 07/27/2017: No hemodynamically significant arterial disease in the internal carotid artery bilaterally. Minimal soft plaque noted. Antegrade right vertebral artery flow. Antegrade left vertebral artery flow.  Ultrasound abdominal aorta:  12/18/2018: The maximum aorta diameter is 2.1 cm (mid). No evidence of atherosclerotic plaque. Normal flow velocities noted. No AAA noted.  LABORATORY DATA:     Latest Ref Rng & Units 01/05/2023   11:05 AM 01/27/2020   10:40 AM 07/22/2019    6:01 AM  CBC  WBC 4.0 - 10.5 K/uL  7.6  10.0   Hemoglobin 13.0 - 17.7 g/dL 62.9  52.8  41.3   Hematocrit 37.5 - 51.0 % 40.2  47.7  45.6   Platelets 150 - 400 K/uL  181  166        Latest Ref Rng & Units 02/21/2023    9:28 AM 02/10/2023    9:41 AM 01/21/2023   10:48 AM  CMP  Glucose 70 - 99 mg/dL 97  244  CANCELED   BUN 10 - 36 mg/dL 38  25  CANCELED   Creatinine 0.76 - 1.27 mg/dL 0.10  2.72  CANCELED   Sodium 134 - 144 mmol/L 145  141  CANCELED   Potassium 3.5 - 5.2 mmol/L 4.3  3.7  CANCELED   Chloride 96 - 106 mmol/L 102  102  CANCELED   CO2 20 - 29 mmol/L 27  25  CANCELED   Calcium 8.6 - 10.2 mg/dL 9.6  9.2  CANCELED     Lipid Panel  12/13/2018: Total cholesterol 173, HDL 53, LDL 93, triglycerides 137.  02/03/2021: Total cholesterol 155, triglycerides 88, HDL 55, LDL 83, non-HDL 100. Hemoglobin 14.8 g/dL, hematocrit 53.6% BUN 32, creatinine 1.22 g/dL, GFR 55. Sodium 144, potassium 4.1, chloride 106, bicarb 32, AST 16, ALT 16, alkaline phosphatase 79  External Labs: Collected: 07/20/2022 performed at [facility] clinical provided by the patient at today's visit. BUN 20.3, creatinine 0.98. BUN to creatinine ratio 20.7. Sodium 140, potassium 4.1, chloride 102, bicarb 31, AST 12, ALT 19, alkaline phosphatase 98   FINAL MEDICATION LIST END OF ENCOUNTER: No orders of the defined types were placed in this encounter.    There are no discontinued medications.    Current Outpatient Medications:    acetaminophen (TYLENOL) 325 MG tablet, Take 650 mg by mouth every 6 (six) hours as needed., Disp: , Rfl:    bumetanide (BUMEX) 0.5 MG tablet, TAKE 1 TABLET BY MOUTH EVERY MORNING., Disp: 90 tablet, Rfl: 1   calcium carbonate (TUMS CHEWY BITES) 750 MG chewable tablet, Chew 1 tablet by mouth as needed for heartburn., Disp: , Rfl:    dapagliflozin propanediol (FARXIGA) 10 MG TABS tablet, Take 10 mg by mouth daily.,  Disp: , Rfl:    dextromethorphan (DELSYM) 30 MG/5ML liquid, Take by mouth at bedtime as needed.,  Disp: , Rfl:    diltiazem (CARDIZEM CD) 120 MG 24 hr capsule, TAKE 1 CAPSULE BY MOUTH EVERY DAY, Disp: 90 capsule, Rfl: 0   DULoxetine (CYMBALTA) 20 MG capsule, Take 20 mg by mouth daily., Disp: , Rfl:    ELIQUIS 5 MG TABS tablet, Take 5 mg by mouth 2 (two) times daily., Disp: , Rfl:    ENTRESTO 24-26 MG, Take 1 tablet by mouth 2 (two) times daily., Disp: , Rfl:    guaiFENesin (MUCINEX) 600 MG 12 hr tablet, Take by mouth., Disp: , Rfl:    latanoprost (XALATAN) 0.005 % ophthalmic solution, INSTILL 1 DROP INTO BOTH EYES EVERY DAY AT NIGHT, Disp: , Rfl:    Multiple Vitamins-Minerals (PRESERVISION AREDS 2+MULTI VIT PO), Take 2 capsules by mouth daily., Disp: , Rfl:    Polyethyl Glycol-Propyl Glycol (SYSTANE) 0.4-0.3 % SOLN, See admin instructions., Disp: , Rfl:    polyethylene glycol (MIRALAX / GLYCOLAX) 17 g packet, 1 packet mixed with 8 ounces of fluid, Disp: , Rfl:    pregabalin (LYRICA) 150 MG capsule, Take 150 mg by mouth 2 (two) times daily., Disp: , Rfl:   IMPRESSION:    ICD-10-CM   1. Acute heart failure with preserved ejection fraction (HFpEF) (HCC)  I50.31 Basic metabolic panel    Magnesium    Pro b natriuretic peptide (BNP)    CANCELED: Basic metabolic panel    CANCELED: Magnesium    CANCELED: Pro b natriuretic peptide (BNP)    2. Long term (current) use of anticoagulants  Z79.01     3. Paroxysmal A-fib (HCC)  I48.0     4. Benign hypertension with CKD (chronic kidney disease) stage III (HCC)  I12.9    N18.30     5. PVC (premature ventricular contraction)  I49.3     6. Former smoker  Z87.891        RECOMMENDATIONS: Shane Ashland Sr. is a 89 y.o. male whose past medical history and cardiac risk factors include: Paroxysmal atrial fibrillation, HFpEF, hypertension with chronic kidney disease stage III, chronic venous insufficiency, PVCs, advanced age.  Acute heart failure  with preserved ejection fraction (HFpEF) (HCC) Has bilateral lower extremity swelling and weight gain compared to the February 21, 2023. They were supposed to have labs prior to today's office visit.   Without knowing serum creatinine levels I would like to avoid up titration of medical therapy. Plan is to check labs.  If renal function is acceptable we will uptitrate diuretics; however,if he has multiple lab abnormalities would recommend hospitalizations for parenteral diuretics and monitoring of renal function closely.  For now we will continue current medical therapy Continue bilateral compression stockings to help mobilize fluid. I have asked the daughter to discuss either palliative or hospice care at his SNF to help address goals of care  Paroxysmal A-fib (HCC) Rate control: Diltiazem. Rhythm control: N/A. Thromboembolic prophylaxis: Eliquis. Diagnosed during his hospitalization at [facility] in August 2023. No history of prior GI bleeds or falls. CHA2DS2-VASc SCORE is 4 which correlates to 4% risk of stroke per year (HFpEF, HTN, age).   Long term (current) use of anticoagulants Indication: Paroxysmal atrial fibrillation. Hemoglobin as of February 2024 relatively stable at 13.8 g/dL. Reemphasized the risks, benefits, and alternatives to anticoagulation. Based on renal function will likely need to reduce the dose of Eliquis 2.5 mg p.o. twice daily  PVC (premature ventricular contraction) Asymptomatic. Continue diltiazem   Orders Placed This Encounter  Procedures   Basic metabolic panel  Magnesium   Pro b natriuretic peptide (BNP)   --Continue cardiac medications as reconciled in final medication list. --Return in about 4 weeks (around 04/22/2023) for Follow up, heart failure management.. Or sooner if needed. --Continue follow-up with your primary care physician regarding the management of your other chronic comorbid conditions.  Patient's questions and concerns were  addressed to his satisfaction. He voices understanding of the instructions provided during this encounter.   This note was created using a voice recognition software as a result there may be grammatical errors inadvertently enclosed that do not reflect the nature of this encounter. Every attempt is made to correct such errors.  Tessa Lerner, [place], [facility]  Pager:  [phone] Office: [phone]

## 2023-03-26 LAB — BASIC METABOLIC PANEL
BUN/Creatinine Ratio: 24 (ref 10–24)
BUN: 32 mg/dL (ref 10–36)
CO2: 27 mmol/L (ref 20–29)
Calcium: 9.6 mg/dL (ref 8.6–10.2)
Chloride: 103 mmol/L (ref 96–106)
Creatinine, Ser: 1.36 mg/dL — ABNORMAL HIGH (ref 0.76–1.27)
Glucose: 88 mg/dL (ref 70–99)
Potassium: 4.6 mmol/L (ref 3.5–5.2)
Sodium: 143 mmol/L (ref 134–144)
eGFR: 48 mL/min/{1.73_m2} — ABNORMAL LOW (ref >59)

## 2023-03-26 LAB — MAGNESIUM: Magnesium: 2.3 mg/dL (ref 1.6–2.3)

## 2023-03-26 LAB — PRO B NATRIURETIC PEPTIDE: NT-Pro BNP: 147 pg/mL (ref 0–486)

## 2023-03-29 NOTE — Progress Notes (Signed)
Called patient no answer left a vm

## 2023-03-30 NOTE — Progress Notes (Signed)
Spoke with patient daughter and patient daughter mention that if there is any changes to patient medication we would have to call the facility @ [phone] And speak to [place]. Called them and left a vm

## 2023-03-30 NOTE — Progress Notes (Signed)
Called patient no answer left a vm

## 2023-04-05 DIAGNOSIS — Z0000 Encounter for general adult medical examination without abnormal findings: Secondary | ICD-10-CM | POA: Diagnosis not present

## 2023-04-05 DIAGNOSIS — Z743 Need for continuous supervision: Secondary | ICD-10-CM | POA: Diagnosis not present

## 2023-04-05 DIAGNOSIS — I10 Essential (primary) hypertension: Secondary | ICD-10-CM | POA: Diagnosis not present

## 2023-04-05 DIAGNOSIS — R609 Edema, unspecified: Secondary | ICD-10-CM | POA: Diagnosis not present

## 2023-04-05 DIAGNOSIS — Z87891 Personal history of nicotine dependence: Secondary | ICD-10-CM | POA: Diagnosis not present

## 2023-04-05 DIAGNOSIS — I959 Hypotension, unspecified: Secondary | ICD-10-CM | POA: Diagnosis not present

## 2023-04-05 DIAGNOSIS — E8770 Fluid overload, unspecified: Secondary | ICD-10-CM | POA: Diagnosis not present

## 2023-04-05 DIAGNOSIS — R011 Cardiac murmur, unspecified: Secondary | ICD-10-CM | POA: Diagnosis not present

## 2023-04-05 DIAGNOSIS — N1831 Chronic kidney disease, stage 3a: Secondary | ICD-10-CM | POA: Diagnosis not present

## 2023-04-05 DIAGNOSIS — I480 Paroxysmal atrial fibrillation: Secondary | ICD-10-CM | POA: Diagnosis not present

## 2023-04-05 NOTE — Progress Notes (Signed)
Called Shane Knight and she told me they need a new Rx for Once he down to 176pounds he can use bumex 0.5mg  PO qday.

## 2023-04-09 DIAGNOSIS — Z9181 History of falling: Secondary | ICD-10-CM | POA: Diagnosis not present

## 2023-04-09 DIAGNOSIS — M6281 Muscle weakness (generalized): Secondary | ICD-10-CM | POA: Diagnosis not present

## 2023-04-09 DIAGNOSIS — Z9981 Dependence on supplemental oxygen: Secondary | ICD-10-CM | POA: Diagnosis not present

## 2023-04-09 DIAGNOSIS — J9601 Acute respiratory failure with hypoxia: Secondary | ICD-10-CM | POA: Diagnosis not present

## 2023-04-12 DIAGNOSIS — I480 Paroxysmal atrial fibrillation: Secondary | ICD-10-CM | POA: Diagnosis not present

## 2023-04-12 DIAGNOSIS — I110 Hypertensive heart disease with heart failure: Secondary | ICD-10-CM | POA: Diagnosis not present

## 2023-04-12 DIAGNOSIS — I5030 Unspecified diastolic (congestive) heart failure: Secondary | ICD-10-CM | POA: Diagnosis not present

## 2023-04-14 DIAGNOSIS — R519 Headache, unspecified: Secondary | ICD-10-CM | POA: Diagnosis not present

## 2023-04-14 DIAGNOSIS — R609 Edema, unspecified: Secondary | ICD-10-CM | POA: Diagnosis not present

## 2023-04-14 DIAGNOSIS — W19XXXA Unspecified fall, initial encounter: Secondary | ICD-10-CM | POA: Diagnosis not present

## 2023-04-14 DIAGNOSIS — S6991XA Unspecified injury of right wrist, hand and finger(s), initial encounter: Secondary | ICD-10-CM | POA: Diagnosis not present

## 2023-04-14 DIAGNOSIS — Z743 Need for continuous supervision: Secondary | ICD-10-CM | POA: Diagnosis not present

## 2023-04-14 DIAGNOSIS — S0990XA Unspecified injury of head, initial encounter: Secondary | ICD-10-CM | POA: Diagnosis not present

## 2023-04-14 DIAGNOSIS — S60211A Contusion of right wrist, initial encounter: Secondary | ICD-10-CM | POA: Diagnosis not present

## 2023-04-14 DIAGNOSIS — M79643 Pain in unspecified hand: Secondary | ICD-10-CM | POA: Diagnosis not present

## 2023-04-21 ENCOUNTER — Encounter: Payer: Self-pay | Admitting: Cardiology

## 2023-04-21 ENCOUNTER — Ambulatory Visit: Payer: Medicare Other | Admitting: Cardiology

## 2023-04-21 VITALS — BP 118/58 | HR 67 | Ht 68.0 in | Wt 180.0 lb

## 2023-04-21 DIAGNOSIS — I480 Paroxysmal atrial fibrillation: Secondary | ICD-10-CM

## 2023-04-21 DIAGNOSIS — I493 Ventricular premature depolarization: Secondary | ICD-10-CM

## 2023-04-21 DIAGNOSIS — I5032 Chronic diastolic (congestive) heart failure: Secondary | ICD-10-CM | POA: Diagnosis not present

## 2023-04-21 DIAGNOSIS — Z7901 Long term (current) use of anticoagulants: Secondary | ICD-10-CM | POA: Diagnosis not present

## 2023-04-21 DIAGNOSIS — N183 Chronic kidney disease, stage 3 unspecified: Secondary | ICD-10-CM | POA: Diagnosis not present

## 2023-04-21 DIAGNOSIS — I129 Hypertensive chronic kidney disease with stage 1 through stage 4 chronic kidney disease, or unspecified chronic kidney disease: Secondary | ICD-10-CM | POA: Diagnosis not present

## 2023-04-21 DIAGNOSIS — Z87891 Personal history of nicotine dependence: Secondary | ICD-10-CM

## 2023-04-21 MED ORDER — RIVAROXABAN 15 MG PO TABS
15.0000 mg | ORAL_TABLET | Freq: Every day | ORAL | 0 refills | Status: DC
Start: 2023-04-21 — End: 2023-10-14

## 2023-04-21 NOTE — Progress Notes (Signed)
Date: 04/21/23 Last Office Visit: 03/25/2023  Chief Complaint  Patient presents with   Acute heart failure with preserved ejection fraction (HFpEF   Follow-up    HPI  Shane Fairclough Sr. is a 89 y.o. male whose past medical history and cardiac risk factors include: Paroxysmal atrial fibrillation, HFpEF, hypertension with chronic kidney disease stage III, chronic venous insufficiency, PVCs, advanced age.  Patient being followed by the practice for HFpEF and atrial fibrillation.  During his office visit in February 2024 he had gained approximately 25 pounds and therefore he has not followed more regularly.   His diuretics and GDMT have been uptitrated slowly as allowed by his blood pressures and renal function.  He presents today for follow-up.  His weights have improved and his blood pressures remained stable based on the records provided by his assisted living facility.   Clinically denies anginal chest pain or heart failure symptoms (with the exception of lower extremity swelling).   Currently uses Bumex 0.5 mg p.o. as needed, for a goal weight of 178 pounds.  ALLERGIES: Allergies  Allergen Reactions   Tape Rash   MEDICATION LIST PRIOR TO VISIT: Current Outpatient Medications on File Prior to Visit  Medication Sig Dispense Refill   acetaminophen (TYLENOL) 325 MG tablet Take 650 mg by mouth every 6 (six) hours as needed.     bumetanide (BUMEX) 0.5 MG tablet TAKE 1 TABLET BY MOUTH EVERY MORNING. 90 tablet 1   calcium carbonate (TUMS CHEWY BITES) 750 MG chewable tablet Chew 1 tablet by mouth as needed for heartburn.     dapagliflozin propanediol (FARXIGA) 10 MG TABS tablet Take 10 mg by mouth daily.     dextromethorphan (DELSYM) 30 MG/5ML liquid Take by mouth at bedtime as needed.     diltiazem (CARDIZEM CD) 120 MG 24 hr capsule TAKE 1 CAPSULE BY MOUTH EVERY DAY 90 capsule 0   DULoxetine (CYMBALTA) 20 MG capsule Take 20 mg by mouth daily.     ENTRESTO 24-26 MG Take 1  tablet by mouth 2 (two) times daily.     guaiFENesin (MUCINEX) 600 MG 12 hr tablet Take by mouth.     latanoprost (XALATAN) 0.005 % ophthalmic solution INSTILL 1 DROP INTO BOTH EYES EVERY DAY AT NIGHT     Multiple Vitamins-Minerals (PRESERVISION AREDS 2+MULTI VIT PO) Take 2 capsules by mouth daily.     Polyethyl Glycol-Propyl Glycol (SYSTANE) 0.4-0.3 % SOLN See admin instructions.     polyethylene glycol (MIRALAX / GLYCOLAX) 17 g packet 1 packet mixed with 8 ounces of fluid     pregabalin (LYRICA) 150 MG capsule Take 150 mg by mouth 2 (two) times daily.     No current facility-administered medications on file prior to visit.    PAST MEDICAL HISTORY: Past Medical History:  Diagnosis Date   (HFpEF) heart failure with preserved ejection fraction (HCC)    AAA (abdominal aortic aneurysm) (HCC)    Chronic kidney disease    Glaucoma    Hyperlipemia    Hypertension    Paroxysmal atrial fibrillation (HCC)    PVC (premature ventricular contraction)     PAST SURGICAL HISTORY: Past Surgical History:  Procedure Laterality Date   APPENDECTOMY     EYE SURGERY     lens implants   HERNIA REPAIR     TENNIS ELBOW RELEASE/NIRSCHEL PROCEDURE     TONSILLECTOMY     TOTAL SHOULDER REPLACEMENT Bilateral     FAMILY HISTORY: The patient family history is not on  file.   SOCIAL HISTORY:  The patient  reports that he quit smoking about 63 years ago. His smoking use included cigarettes. He has a 30.00 pack-year smoking history. He has never used smokeless tobacco. He reports current alcohol use. He reports that he does not use drugs.  Review of Systems  Cardiovascular:  Positive for dyspnea on exertion and leg swelling (stable). Negative for chest pain, claudication, irregular heartbeat, near-syncope, orthopnea, palpitations, paroxysmal nocturnal dyspnea and syncope.  Respiratory:  Negative for shortness of breath.   Hematologic/Lymphatic: Negative for bleeding problem.  Musculoskeletal:  Negative for  muscle cramps and myalgias.  Neurological:  Negative for dizziness and light-headedness.    PHYSICAL EXAM:    04/21/2023    9:50 AM 03/25/2023    9:39 AM 02/11/2023   11:56 AM  Vitals with BMI  Height 5\' 8"  5\' 8"  5\' 8"   Weight 180 lbs 184 lbs 3 oz 180 lbs  BMI 27.38 28.01 27.38  Systolic 118 123 409  Diastolic 58 45 60  Pulse 67  65   Physical Exam  Constitutional: No distress.  Appears older than stated age, ambulates w/ walker, hemodynamically stable.   Neck: No JVD present.  Cardiovascular: Normal rate, regular rhythm, S1 normal, S2 normal, intact distal pulses and normal pulses. Exam reveals no gallop, no S3 and no S4.  Murmur heard. Holosystolic murmur is present with a grade of 3/6 at the lower left sternal border. Pulmonary/Chest: Effort normal and breath sounds normal. No stridor. He has no wheezes. He has no rales.  Abdominal: Soft. Bowel sounds are normal. He exhibits no distension. There is no abdominal tenderness.  Musculoskeletal:        General: Edema (Bilateral lower extremities up to mid shin.  Compression stockings extends above  the knee) present.     Cervical back: Neck supple.  Neurological: He is alert and oriented to person, place, and time. He has intact cranial nerves (2-12).  Skin: Skin is warm and moist.   CARDIAC DATABASE: EKG: 03/27/2021: Normal sinus rhythm, 86 bpm, first-degree AV block, right bundle branch block, left axis deviation, left anterior fascicular block, frequent PVCs.  05/27/2022: Normal sinus rhythm, 71 bpm, left axis, first-degree AV block, right bundle branch block, ST-T changes likely secondary RBBB.   07/27/22 Sinus Bradycardia, 58bpm, First degree A-V block, RBBB.   01/11/2023: Sinus rhythm, 77 bpm, first-degree AV block, right bundle branch block, left axis, left anterior fascicular block.  Echocardiogram: 04/07/2021:  Left ventricle cavity is normal in size. Moderate concentric hypertrophy of the left ventricle. Normal global  wall motion. Normal LV systolic function with EF 60%. Doppler evidence of grade I (impaired) diastolic dysfunction, normal LAP.  Right ventricle cavity is mildly dilated.  Normal right ventricular function.  Moderate tricuspid regurgitation. Peak RA-RV gradient 27 mmHg.  Mild pulmonic regurgitation.  IVC not seen.  No significant change compared to previous study in 2019.   07/04/2022 @ [facility]:  Left Ventricle: Systolic function is low normal. EF: 50-55%. Ejection fraction measured by 3D is 50%,    Left Ventricle: Doppler parameters consistent with mild diastolic dysfunction and low to normal LA pressure.    Right Atrium: Right atrium is mildly to moderately dilated.    Aortic Valve: There is no regurgitation or stenosis.    Mitral Valve: There is moderate regurgitation.    Tricuspid Valve: There is moderate regurgitation.    Tricuspid Valve: The right ventricular systolic pressure is moderately elevated (50-59 mmHg).  Carotid artery duplex  07/27/2017: No hemodynamically significant arterial disease in the internal carotid artery bilaterally. Minimal soft plaque noted. Antegrade right vertebral artery flow. Antegrade left vertebral artery flow.  Ultrasound abdominal aorta:  12/18/2018: The maximum aorta diameter is 2.1 cm (mid). No evidence of atherosclerotic plaque. Normal flow velocities noted. No AAA noted.  LABORATORY DATA:    Latest Ref Rng & Units 01/05/2023   11:05 AM 01/27/2020   10:40 AM 07/22/2019    6:01 AM  CBC  WBC 4.0 - 10.5 K/uL  7.6  10.0   Hemoglobin 13.0 - 17.7 g/dL 40.9  81.1  91.4   Hematocrit 37.5 - 51.0 % 40.2  47.7  45.6   Platelets 150 - 400 K/uL  181  166        Latest Ref Rng & Units 03/25/2023   11:25 AM 02/21/2023    9:28 AM 02/10/2023    9:41 AM  CMP  Glucose 70 - 99 mg/dL 88  97  782   BUN 10 - 36 mg/dL 32  38  25   Creatinine 0.76 - 1.27 mg/dL 9.56  2.13  0.86   Sodium 134 - 144 mmol/L 143  145  141   Potassium 3.5 - 5.2 mmol/L 4.6  4.3   3.7   Chloride 96 - 106 mmol/L 103  102  102   CO2 20 - 29 mmol/L 27  27  25    Calcium 8.6 - 10.2 mg/dL 9.6  9.6  9.2     Lipid Panel  12/13/2018: Total cholesterol 173, HDL 53, LDL 93, triglycerides 137.  02/03/2021: Total cholesterol 155, triglycerides 88, HDL 55, LDL 83, non-HDL 100. Hemoglobin 14.8 g/dL, hematocrit 57.8% BUN 32, creatinine 1.22 g/dL, GFR 55. Sodium 144, potassium 4.1, chloride 106, bicarb 32, AST 16, ALT 16, alkaline phosphatase 79  External Labs: Collected: 07/20/2022 performed at [facility] clinical provided by the patient at today's visit. BUN 20.3, creatinine 0.98. BUN to creatinine ratio 20.7. Sodium 140, potassium 4.1, chloride 102, bicarb 31, AST 12, ALT 19, alkaline phosphatase 98  External Labs: Collected: Apr 05, 2023 available in Care Everywhere. Sodium 141, potassium 4.7, chloride 102, bicarb 28. BUN 33, creatinine 1.49. AST 29, ALT 19, alkaline phosphatase 131 Hemoglobin 13.3, hematocrit 41.3%   FINAL MEDICATION LIST END OF ENCOUNTER: Meds ordered this encounter  Medications   rivaroxaban (XARELTO) 15 MG TABS tablet    Sig: Take 1 tablet (15 mg total) by mouth daily with supper.    Dispense:  90 tablet    Refill:  0     Medications Discontinued During This Encounter  Medication Reason   ELIQUIS 5 MG TABS tablet Change in therapy      Current Outpatient Medications:    acetaminophen (TYLENOL) 325 MG tablet, Take 650 mg by mouth every 6 (six) hours as needed., Disp: , Rfl:    bumetanide (BUMEX) 0.5 MG tablet, TAKE 1 TABLET BY MOUTH EVERY MORNING., Disp: 90 tablet, Rfl: 1   calcium carbonate (TUMS CHEWY BITES) 750 MG chewable tablet, Chew 1 tablet by mouth as needed for heartburn., Disp: , Rfl:    dapagliflozin propanediol (FARXIGA) 10 MG TABS tablet, Take 10 mg by mouth daily., Disp: , Rfl:    dextromethorphan (DELSYM) 30 MG/5ML liquid, Take by mouth at bedtime as needed., Disp: , Rfl:    diltiazem (CARDIZEM CD) 120 MG 24 hr capsule, TAKE 1  CAPSULE BY MOUTH EVERY DAY, Disp: 90 capsule, Rfl: 0   DULoxetine (CYMBALTA) 20 MG capsule, Take 20 mg  by mouth daily., Disp: , Rfl:    ENTRESTO 24-26 MG, Take 1 tablet by mouth 2 (two) times daily., Disp: , Rfl:    guaiFENesin (MUCINEX) 600 MG 12 hr tablet, Take by mouth., Disp: , Rfl:    latanoprost (XALATAN) 0.005 % ophthalmic solution, INSTILL 1 DROP INTO BOTH EYES EVERY DAY AT NIGHT, Disp: , Rfl:    Multiple Vitamins-Minerals (PRESERVISION AREDS 2+MULTI VIT PO), Take 2 capsules by mouth daily., Disp: , Rfl:    Polyethyl Glycol-Propyl Glycol (SYSTANE) 0.4-0.3 % SOLN, See admin instructions., Disp: , Rfl:    polyethylene glycol (MIRALAX / GLYCOLAX) 17 g packet, 1 packet mixed with 8 ounces of fluid, Disp: , Rfl:    pregabalin (LYRICA) 150 MG capsule, Take 150 mg by mouth 2 (two) times daily., Disp: , Rfl:    rivaroxaban (XARELTO) 15 MG TABS tablet, Take 1 tablet (15 mg total) by mouth daily with supper., Disp: 90 tablet, Rfl: 0  IMPRESSION:    ICD-10-CM   1. Chronic heart failure with preserved ejection fraction (HFpEF) (HCC)  I50.32 Pro b natriuretic peptide (BNP)    Hemoglobin and hematocrit, blood    Basic metabolic panel    2. Paroxysmal A-fib (HCC)  I48.0 Pro b natriuretic peptide (BNP)    Hemoglobin and hematocrit, blood    Basic metabolic panel    3. Long term (current) use of anticoagulants  Z79.01 rivaroxaban (XARELTO) 15 MG TABS tablet    Pro b natriuretic peptide (BNP)    Hemoglobin and hematocrit, blood    Basic metabolic panel    4. Benign hypertension with CKD (chronic kidney disease) stage III (HCC)  I12.9    N18.30     5. PVC (premature ventricular contraction)  I49.3     6. Former smoker  Z87.891        RECOMMENDATIONS: Shane Otero Sr. is a 89 y.o. male whose past medical history and cardiac risk factors include: Paroxysmal atrial fibrillation, HFpEF, hypertension with chronic kidney disease stage III, chronic venous insufficiency, PVCs, advanced  age.  Chronic heart failure with preserved ejection fraction (HFpEF) (HCC) Improving. Medications reconciled. Recommend using Bumex regularly to maintain body weight 178 pounds. Records from facility reviewed. Continue bilateral compression stockings to help mobilize fluid. I have asked the daughter to discuss either palliative or hospice care at his SNF to help address goals of care. Will order labs prior to the next office visit.  Paroxysmal A-fib (HCC) Rate control: Diltiazem. Rhythm control: N/A. Thromboembolic prophylaxis: Xarelto. Diagnosed during his hospitalization at [facility] in August 2023. No history of prior GI bleeds or falls. CHA2DS2-VASc SCORE is 4 which correlates to 4% risk of stroke per year (HFpEF, HTN, age).  Given his serum creatinine levels and clear and clearance will transition from Eliquis to Xarelto.  Long term (current) use of anticoagulants Indication: Paroxysmal atrial fibrillation. Most recent hemoglobin from May 2024 independently reviewed, relatively stable. Reemphasized the risks, benefits, and alternatives to anticoagulation.  PVC (premature ventricular contraction) Asymptomatic. Continue diltiazem  Orders Placed This Encounter  Procedures   Pro b natriuretic peptide (BNP)   Hemoglobin and hematocrit, blood   Basic metabolic panel   --Continue cardiac medications as reconciled in final medication list. --Return in about 6 months (around 10/22/2023) for Follow up, A. fib, heart failure management.. Or sooner if needed. --Continue follow-up with your primary care physician regarding the management of your other chronic comorbid conditions.  Patient's questions and concerns were addressed to his satisfaction. He voices  understanding of the instructions provided during this encounter.   This note was created using a voice recognition software as a result there may be grammatical errors inadvertently enclosed that do not reflect the nature of  this encounter. Every attempt is made to correct such errors.  Tessa Lerner, [place], [facility]  Pager:  [phone] Office: [phone]

## 2023-04-26 DIAGNOSIS — I5030 Unspecified diastolic (congestive) heart failure: Secondary | ICD-10-CM | POA: Diagnosis not present

## 2023-04-26 DIAGNOSIS — I130 Hypertensive heart and chronic kidney disease with heart failure and stage 1 through stage 4 chronic kidney disease, or unspecified chronic kidney disease: Secondary | ICD-10-CM | POA: Diagnosis not present

## 2023-04-26 DIAGNOSIS — N1831 Chronic kidney disease, stage 3a: Secondary | ICD-10-CM | POA: Diagnosis not present

## 2023-04-27 DIAGNOSIS — M79675 Pain in left toe(s): Secondary | ICD-10-CM | POA: Diagnosis not present

## 2023-04-27 DIAGNOSIS — N189 Chronic kidney disease, unspecified: Secondary | ICD-10-CM | POA: Diagnosis not present

## 2023-04-27 DIAGNOSIS — M2012 Hallux valgus (acquired), left foot: Secondary | ICD-10-CM | POA: Diagnosis not present

## 2023-04-27 DIAGNOSIS — B351 Tinea unguium: Secondary | ICD-10-CM | POA: Diagnosis not present

## 2023-04-30 DIAGNOSIS — W19XXXA Unspecified fall, initial encounter: Secondary | ICD-10-CM | POA: Diagnosis not present

## 2023-04-30 DIAGNOSIS — Z743 Need for continuous supervision: Secondary | ICD-10-CM | POA: Diagnosis not present

## 2023-04-30 DIAGNOSIS — R410 Disorientation, unspecified: Secondary | ICD-10-CM | POA: Diagnosis not present

## 2023-05-01 DIAGNOSIS — I4510 Unspecified right bundle-branch block: Secondary | ICD-10-CM | POA: Diagnosis not present

## 2023-05-01 DIAGNOSIS — S0990XA Unspecified injury of head, initial encounter: Secondary | ICD-10-CM | POA: Diagnosis not present

## 2023-05-01 DIAGNOSIS — T1490XA Injury, unspecified, initial encounter: Secondary | ICD-10-CM | POA: Diagnosis not present

## 2023-05-01 DIAGNOSIS — R799 Abnormal finding of blood chemistry, unspecified: Secondary | ICD-10-CM | POA: Diagnosis not present

## 2023-05-01 DIAGNOSIS — I440 Atrioventricular block, first degree: Secondary | ICD-10-CM | POA: Diagnosis not present

## 2023-05-01 DIAGNOSIS — Z043 Encounter for examination and observation following other accident: Secondary | ICD-10-CM | POA: Diagnosis not present

## 2023-05-01 DIAGNOSIS — M1990 Unspecified osteoarthritis, unspecified site: Secondary | ICD-10-CM | POA: Diagnosis not present

## 2023-05-01 DIAGNOSIS — Z7901 Long term (current) use of anticoagulants: Secondary | ICD-10-CM | POA: Diagnosis not present

## 2023-05-01 DIAGNOSIS — Z87891 Personal history of nicotine dependence: Secondary | ICD-10-CM | POA: Diagnosis not present

## 2023-05-01 DIAGNOSIS — R9431 Abnormal electrocardiogram [ECG] [EKG]: Secondary | ICD-10-CM | POA: Diagnosis not present

## 2023-05-01 DIAGNOSIS — Z13228 Encounter for screening for other metabolic disorders: Secondary | ICD-10-CM | POA: Diagnosis not present

## 2023-05-01 DIAGNOSIS — R8290 Unspecified abnormal findings in urine: Secondary | ICD-10-CM | POA: Diagnosis not present

## 2023-05-01 DIAGNOSIS — I10 Essential (primary) hypertension: Secondary | ICD-10-CM | POA: Diagnosis not present

## 2023-05-01 DIAGNOSIS — H3530 Unspecified macular degeneration: Secondary | ICD-10-CM | POA: Diagnosis not present

## 2023-05-01 DIAGNOSIS — E782 Mixed hyperlipidemia: Secondary | ICD-10-CM | POA: Diagnosis not present

## 2023-05-01 DIAGNOSIS — R930 Abnormal findings on diagnostic imaging of skull and head, not elsewhere classified: Secondary | ICD-10-CM | POA: Diagnosis not present

## 2023-05-03 DIAGNOSIS — B952 Enterococcus as the cause of diseases classified elsewhere: Secondary | ICD-10-CM | POA: Diagnosis not present

## 2023-05-03 DIAGNOSIS — R8290 Unspecified abnormal findings in urine: Secondary | ICD-10-CM | POA: Diagnosis not present

## 2023-05-05 DIAGNOSIS — H353211 Exudative age-related macular degeneration, right eye, with active choroidal neovascularization: Secondary | ICD-10-CM | POA: Diagnosis not present

## 2023-05-09 DIAGNOSIS — I5032 Chronic diastolic (congestive) heart failure: Secondary | ICD-10-CM | POA: Diagnosis not present

## 2023-05-09 DIAGNOSIS — Z79899 Other long term (current) drug therapy: Secondary | ICD-10-CM | POA: Diagnosis not present

## 2023-05-09 DIAGNOSIS — I10 Essential (primary) hypertension: Secondary | ICD-10-CM | POA: Diagnosis not present

## 2023-05-09 DIAGNOSIS — Z515 Encounter for palliative care: Secondary | ICD-10-CM | POA: Diagnosis not present

## 2023-05-09 DIAGNOSIS — R5381 Other malaise: Secondary | ICD-10-CM | POA: Diagnosis not present

## 2023-05-09 DIAGNOSIS — Z66 Do not resuscitate: Secondary | ICD-10-CM | POA: Diagnosis not present

## 2023-05-09 DIAGNOSIS — R5383 Other fatigue: Secondary | ICD-10-CM | POA: Diagnosis not present

## 2023-05-09 DIAGNOSIS — R410 Disorientation, unspecified: Secondary | ICD-10-CM | POA: Diagnosis not present

## 2023-05-09 DIAGNOSIS — Z743 Need for continuous supervision: Secondary | ICD-10-CM | POA: Diagnosis not present

## 2023-05-09 DIAGNOSIS — Z87891 Personal history of nicotine dependence: Secondary | ICD-10-CM | POA: Diagnosis not present

## 2023-05-09 DIAGNOSIS — J986 Disorders of diaphragm: Secondary | ICD-10-CM | POA: Diagnosis not present

## 2023-05-10 DIAGNOSIS — B9689 Other specified bacterial agents as the cause of diseases classified elsewhere: Secondary | ICD-10-CM | POA: Diagnosis not present

## 2023-05-10 DIAGNOSIS — J9601 Acute respiratory failure with hypoxia: Secondary | ICD-10-CM | POA: Diagnosis not present

## 2023-05-10 DIAGNOSIS — Z9981 Dependence on supplemental oxygen: Secondary | ICD-10-CM | POA: Diagnosis not present

## 2023-05-10 DIAGNOSIS — Z9181 History of falling: Secondary | ICD-10-CM | POA: Diagnosis not present

## 2023-05-10 DIAGNOSIS — M6281 Muscle weakness (generalized): Secondary | ICD-10-CM | POA: Diagnosis not present

## 2023-05-10 DIAGNOSIS — N1831 Chronic kidney disease, stage 3a: Secondary | ICD-10-CM | POA: Diagnosis not present

## 2023-05-10 DIAGNOSIS — N390 Urinary tract infection, site not specified: Secondary | ICD-10-CM | POA: Diagnosis not present

## 2023-05-10 DIAGNOSIS — R5381 Other malaise: Secondary | ICD-10-CM | POA: Diagnosis not present

## 2023-05-11 DIAGNOSIS — R059 Cough, unspecified: Secondary | ICD-10-CM | POA: Diagnosis not present

## 2023-05-12 DIAGNOSIS — G3184 Mild cognitive impairment, so stated: Secondary | ICD-10-CM | POA: Diagnosis not present

## 2023-05-17 DIAGNOSIS — Z8709 Personal history of other diseases of the respiratory system: Secondary | ICD-10-CM | POA: Diagnosis not present

## 2023-05-17 DIAGNOSIS — M7989 Other specified soft tissue disorders: Secondary | ICD-10-CM | POA: Diagnosis not present

## 2023-05-17 DIAGNOSIS — N189 Chronic kidney disease, unspecified: Secondary | ICD-10-CM | POA: Diagnosis not present

## 2023-05-17 DIAGNOSIS — I10 Essential (primary) hypertension: Secondary | ICD-10-CM | POA: Diagnosis not present

## 2023-05-17 DIAGNOSIS — I129 Hypertensive chronic kidney disease with stage 1 through stage 4 chronic kidney disease, or unspecified chronic kidney disease: Secondary | ICD-10-CM | POA: Diagnosis not present

## 2023-05-24 DIAGNOSIS — R5381 Other malaise: Secondary | ICD-10-CM | POA: Diagnosis not present

## 2023-05-26 DIAGNOSIS — F0392 Unspecified dementia, unspecified severity, with psychotic disturbance: Secondary | ICD-10-CM | POA: Diagnosis not present

## 2023-06-02 DIAGNOSIS — R6889 Other general symptoms and signs: Secondary | ICD-10-CM | POA: Diagnosis not present

## 2023-06-02 DIAGNOSIS — R609 Edema, unspecified: Secondary | ICD-10-CM | POA: Diagnosis not present

## 2023-06-02 DIAGNOSIS — S0990XA Unspecified injury of head, initial encounter: Secondary | ICD-10-CM | POA: Diagnosis not present

## 2023-06-02 DIAGNOSIS — Z743 Need for continuous supervision: Secondary | ICD-10-CM | POA: Diagnosis not present

## 2023-06-02 DIAGNOSIS — Z87891 Personal history of nicotine dependence: Secondary | ICD-10-CM | POA: Diagnosis not present

## 2023-06-02 DIAGNOSIS — M25561 Pain in right knee: Secondary | ICD-10-CM | POA: Diagnosis not present

## 2023-06-02 DIAGNOSIS — S0101XA Laceration without foreign body of scalp, initial encounter: Secondary | ICD-10-CM | POA: Diagnosis not present

## 2023-06-02 DIAGNOSIS — Z043 Encounter for examination and observation following other accident: Secondary | ICD-10-CM | POA: Diagnosis not present

## 2023-06-02 DIAGNOSIS — S80919A Unspecified superficial injury of unspecified knee, initial encounter: Secondary | ICD-10-CM | POA: Diagnosis not present

## 2023-06-05 DIAGNOSIS — M25561 Pain in right knee: Secondary | ICD-10-CM | POA: Diagnosis not present

## 2023-06-07 DIAGNOSIS — Z9181 History of falling: Secondary | ICD-10-CM | POA: Diagnosis not present

## 2023-06-07 DIAGNOSIS — I130 Hypertensive heart and chronic kidney disease with heart failure and stage 1 through stage 4 chronic kidney disease, or unspecified chronic kidney disease: Secondary | ICD-10-CM | POA: Diagnosis not present

## 2023-06-07 DIAGNOSIS — N189 Chronic kidney disease, unspecified: Secondary | ICD-10-CM | POA: Diagnosis not present

## 2023-06-07 DIAGNOSIS — N179 Acute kidney failure, unspecified: Secondary | ICD-10-CM | POA: Diagnosis not present

## 2023-06-07 DIAGNOSIS — I509 Heart failure, unspecified: Secondary | ICD-10-CM | POA: Diagnosis not present

## 2023-06-07 DIAGNOSIS — M25561 Pain in right knee: Secondary | ICD-10-CM | POA: Diagnosis not present

## 2023-06-08 DIAGNOSIS — M542 Cervicalgia: Secondary | ICD-10-CM | POA: Diagnosis not present

## 2023-06-08 DIAGNOSIS — S51011A Laceration without foreign body of right elbow, initial encounter: Secondary | ICD-10-CM | POA: Diagnosis not present

## 2023-06-08 DIAGNOSIS — R58 Hemorrhage, not elsewhere classified: Secondary | ICD-10-CM | POA: Diagnosis not present

## 2023-06-08 DIAGNOSIS — R81 Glycosuria: Secondary | ICD-10-CM | POA: Diagnosis not present

## 2023-06-08 DIAGNOSIS — I10 Essential (primary) hypertension: Secondary | ICD-10-CM | POA: Diagnosis not present

## 2023-06-08 DIAGNOSIS — G8911 Acute pain due to trauma: Secondary | ICD-10-CM | POA: Diagnosis not present

## 2023-06-08 DIAGNOSIS — D3002 Benign neoplasm of left kidney: Secondary | ICD-10-CM | POA: Diagnosis not present

## 2023-06-08 DIAGNOSIS — K2960 Other gastritis without bleeding: Secondary | ICD-10-CM | POA: Diagnosis not present

## 2023-06-08 DIAGNOSIS — G629 Polyneuropathy, unspecified: Secondary | ICD-10-CM | POA: Diagnosis not present

## 2023-06-08 DIAGNOSIS — R0902 Hypoxemia: Secondary | ICD-10-CM | POA: Diagnosis not present

## 2023-06-08 DIAGNOSIS — K2961 Other gastritis with bleeding: Secondary | ICD-10-CM | POA: Diagnosis not present

## 2023-06-08 DIAGNOSIS — Z7901 Long term (current) use of anticoagulants: Secondary | ICD-10-CM | POA: Diagnosis not present

## 2023-06-08 DIAGNOSIS — J984 Other disorders of lung: Secondary | ICD-10-CM | POA: Diagnosis not present

## 2023-06-08 DIAGNOSIS — K921 Melena: Secondary | ICD-10-CM | POA: Diagnosis not present

## 2023-06-08 DIAGNOSIS — W19XXXA Unspecified fall, initial encounter: Secondary | ICD-10-CM | POA: Diagnosis not present

## 2023-06-08 DIAGNOSIS — R9431 Abnormal electrocardiogram [ECG] [EKG]: Secondary | ICD-10-CM | POA: Diagnosis not present

## 2023-06-08 DIAGNOSIS — C4339 Malignant melanoma of other parts of face: Secondary | ICD-10-CM | POA: Diagnosis not present

## 2023-06-08 DIAGNOSIS — Z791 Long term (current) use of non-steroidal anti-inflammatories (NSAID): Secondary | ICD-10-CM | POA: Diagnosis not present

## 2023-06-08 DIAGNOSIS — I480 Paroxysmal atrial fibrillation: Secondary | ICD-10-CM | POA: Diagnosis not present

## 2023-06-08 DIAGNOSIS — H3530 Unspecified macular degeneration: Secondary | ICD-10-CM | POA: Diagnosis not present

## 2023-06-08 DIAGNOSIS — M1990 Unspecified osteoarthritis, unspecified site: Secondary | ICD-10-CM | POA: Diagnosis not present

## 2023-06-08 DIAGNOSIS — S0990XA Unspecified injury of head, initial encounter: Secondary | ICD-10-CM | POA: Diagnosis not present

## 2023-06-08 DIAGNOSIS — Z79899 Other long term (current) drug therapy: Secondary | ICD-10-CM | POA: Diagnosis not present

## 2023-06-08 DIAGNOSIS — Y92009 Unspecified place in unspecified non-institutional (private) residence as the place of occurrence of the external cause: Secondary | ICD-10-CM | POA: Diagnosis not present

## 2023-06-08 DIAGNOSIS — D62 Acute posthemorrhagic anemia: Secondary | ICD-10-CM | POA: Diagnosis not present

## 2023-06-08 DIAGNOSIS — J9811 Atelectasis: Secondary | ICD-10-CM | POA: Diagnosis not present

## 2023-06-08 DIAGNOSIS — I440 Atrioventricular block, first degree: Secondary | ICD-10-CM | POA: Diagnosis not present

## 2023-06-08 DIAGNOSIS — Z743 Need for continuous supervision: Secondary | ICD-10-CM | POA: Diagnosis not present

## 2023-06-08 DIAGNOSIS — Z66 Do not resuscitate: Secondary | ICD-10-CM | POA: Diagnosis not present

## 2023-06-08 DIAGNOSIS — I509 Heart failure, unspecified: Secondary | ICD-10-CM | POA: Diagnosis not present

## 2023-06-08 DIAGNOSIS — K2970 Gastritis, unspecified, without bleeding: Secondary | ICD-10-CM | POA: Diagnosis not present

## 2023-06-08 DIAGNOSIS — I4891 Unspecified atrial fibrillation: Secondary | ICD-10-CM | POA: Diagnosis not present

## 2023-06-08 DIAGNOSIS — I340 Nonrheumatic mitral (valve) insufficiency: Secondary | ICD-10-CM | POA: Diagnosis not present

## 2023-06-08 DIAGNOSIS — I130 Hypertensive heart and chronic kidney disease with heart failure and stage 1 through stage 4 chronic kidney disease, or unspecified chronic kidney disease: Secondary | ICD-10-CM | POA: Diagnosis not present

## 2023-06-08 DIAGNOSIS — J986 Disorders of diaphragm: Secondary | ICD-10-CM | POA: Diagnosis not present

## 2023-06-08 DIAGNOSIS — R531 Weakness: Secondary | ICD-10-CM | POA: Diagnosis not present

## 2023-06-08 DIAGNOSIS — Z9181 History of falling: Secondary | ICD-10-CM | POA: Diagnosis not present

## 2023-06-08 DIAGNOSIS — R6889 Other general symptoms and signs: Secondary | ICD-10-CM | POA: Diagnosis not present

## 2023-06-08 DIAGNOSIS — I5032 Chronic diastolic (congestive) heart failure: Secondary | ICD-10-CM | POA: Diagnosis not present

## 2023-06-08 DIAGNOSIS — D649 Anemia, unspecified: Secondary | ICD-10-CM | POA: Diagnosis not present

## 2023-06-08 DIAGNOSIS — I110 Hypertensive heart disease with heart failure: Secondary | ICD-10-CM | POA: Diagnosis not present

## 2023-06-08 DIAGNOSIS — S0083XA Contusion of other part of head, initial encounter: Secondary | ICD-10-CM | POA: Diagnosis not present

## 2023-06-08 DIAGNOSIS — N1832 Chronic kidney disease, stage 3b: Secondary | ICD-10-CM | POA: Diagnosis not present

## 2023-06-08 DIAGNOSIS — S8001XA Contusion of right knee, initial encounter: Secondary | ICD-10-CM | POA: Diagnosis not present

## 2023-06-08 DIAGNOSIS — E782 Mixed hyperlipidemia: Secondary | ICD-10-CM | POA: Diagnosis not present

## 2023-06-08 DIAGNOSIS — R296 Repeated falls: Secondary | ICD-10-CM | POA: Diagnosis not present

## 2023-06-08 DIAGNOSIS — Z87891 Personal history of nicotine dependence: Secondary | ICD-10-CM | POA: Diagnosis not present

## 2023-06-10 DIAGNOSIS — I110 Hypertensive heart disease with heart failure: Secondary | ICD-10-CM | POA: Diagnosis not present

## 2023-06-10 DIAGNOSIS — D649 Anemia, unspecified: Secondary | ICD-10-CM | POA: Diagnosis not present

## 2023-06-10 DIAGNOSIS — I340 Nonrheumatic mitral (valve) insufficiency: Secondary | ICD-10-CM | POA: Diagnosis not present

## 2023-06-10 DIAGNOSIS — I509 Heart failure, unspecified: Secondary | ICD-10-CM | POA: Diagnosis not present

## 2023-06-14 DIAGNOSIS — R81 Glycosuria: Secondary | ICD-10-CM | POA: Diagnosis not present

## 2023-06-14 DIAGNOSIS — I5030 Unspecified diastolic (congestive) heart failure: Secondary | ICD-10-CM | POA: Diagnosis not present

## 2023-06-14 DIAGNOSIS — K5904 Chronic idiopathic constipation: Secondary | ICD-10-CM | POA: Diagnosis not present

## 2023-06-14 DIAGNOSIS — N2889 Other specified disorders of kidney and ureter: Secondary | ICD-10-CM | POA: Diagnosis not present

## 2023-06-14 DIAGNOSIS — E782 Mixed hyperlipidemia: Secondary | ICD-10-CM | POA: Diagnosis not present

## 2023-06-14 DIAGNOSIS — I480 Paroxysmal atrial fibrillation: Secondary | ICD-10-CM | POA: Diagnosis not present

## 2023-06-14 DIAGNOSIS — Z9181 History of falling: Secondary | ICD-10-CM | POA: Diagnosis not present

## 2023-06-14 DIAGNOSIS — N1832 Chronic kidney disease, stage 3b: Secondary | ICD-10-CM | POA: Diagnosis not present

## 2023-06-14 DIAGNOSIS — J986 Disorders of diaphragm: Secondary | ICD-10-CM | POA: Diagnosis not present

## 2023-06-14 DIAGNOSIS — I10 Essential (primary) hypertension: Secondary | ICD-10-CM | POA: Diagnosis not present

## 2023-06-14 DIAGNOSIS — I130 Hypertensive heart and chronic kidney disease with heart failure and stage 1 through stage 4 chronic kidney disease, or unspecified chronic kidney disease: Secondary | ICD-10-CM | POA: Diagnosis not present

## 2023-06-14 DIAGNOSIS — K922 Gastrointestinal hemorrhage, unspecified: Secondary | ICD-10-CM | POA: Diagnosis not present

## 2023-06-14 DIAGNOSIS — Z87891 Personal history of nicotine dependence: Secondary | ICD-10-CM | POA: Diagnosis not present

## 2023-06-14 DIAGNOSIS — D62 Acute posthemorrhagic anemia: Secondary | ICD-10-CM | POA: Diagnosis not present

## 2023-06-14 DIAGNOSIS — R296 Repeated falls: Secondary | ICD-10-CM | POA: Diagnosis not present

## 2023-06-14 DIAGNOSIS — H3530 Unspecified macular degeneration: Secondary | ICD-10-CM | POA: Diagnosis not present

## 2023-06-14 DIAGNOSIS — H409 Unspecified glaucoma: Secondary | ICD-10-CM | POA: Diagnosis not present

## 2023-06-14 DIAGNOSIS — M1990 Unspecified osteoarthritis, unspecified site: Secondary | ICD-10-CM | POA: Diagnosis not present

## 2023-06-14 DIAGNOSIS — G629 Polyneuropathy, unspecified: Secondary | ICD-10-CM | POA: Diagnosis not present

## 2023-06-14 DIAGNOSIS — I5032 Chronic diastolic (congestive) heart failure: Secondary | ICD-10-CM | POA: Diagnosis not present

## 2023-06-14 DIAGNOSIS — E876 Hypokalemia: Secondary | ICD-10-CM | POA: Diagnosis not present

## 2023-06-16 DIAGNOSIS — H3530 Unspecified macular degeneration: Secondary | ICD-10-CM | POA: Diagnosis not present

## 2023-06-16 DIAGNOSIS — K5904 Chronic idiopathic constipation: Secondary | ICD-10-CM | POA: Diagnosis not present

## 2023-06-16 DIAGNOSIS — R81 Glycosuria: Secondary | ICD-10-CM | POA: Diagnosis not present

## 2023-06-16 DIAGNOSIS — Z9181 History of falling: Secondary | ICD-10-CM | POA: Diagnosis not present

## 2023-06-16 DIAGNOSIS — D62 Acute posthemorrhagic anemia: Secondary | ICD-10-CM | POA: Diagnosis not present

## 2023-06-16 DIAGNOSIS — R296 Repeated falls: Secondary | ICD-10-CM | POA: Diagnosis not present

## 2023-06-16 DIAGNOSIS — I130 Hypertensive heart and chronic kidney disease with heart failure and stage 1 through stage 4 chronic kidney disease, or unspecified chronic kidney disease: Secondary | ICD-10-CM | POA: Diagnosis not present

## 2023-06-16 DIAGNOSIS — K922 Gastrointestinal hemorrhage, unspecified: Secondary | ICD-10-CM | POA: Diagnosis not present

## 2023-06-16 DIAGNOSIS — J986 Disorders of diaphragm: Secondary | ICD-10-CM | POA: Diagnosis not present

## 2023-06-16 DIAGNOSIS — H409 Unspecified glaucoma: Secondary | ICD-10-CM | POA: Diagnosis not present

## 2023-06-16 DIAGNOSIS — E782 Mixed hyperlipidemia: Secondary | ICD-10-CM | POA: Diagnosis not present

## 2023-06-16 DIAGNOSIS — N1832 Chronic kidney disease, stage 3b: Secondary | ICD-10-CM | POA: Diagnosis not present

## 2023-06-16 DIAGNOSIS — G629 Polyneuropathy, unspecified: Secondary | ICD-10-CM | POA: Diagnosis not present

## 2023-06-16 DIAGNOSIS — I480 Paroxysmal atrial fibrillation: Secondary | ICD-10-CM | POA: Diagnosis not present

## 2023-06-16 DIAGNOSIS — I5032 Chronic diastolic (congestive) heart failure: Secondary | ICD-10-CM | POA: Diagnosis not present

## 2023-06-16 DIAGNOSIS — M1990 Unspecified osteoarthritis, unspecified site: Secondary | ICD-10-CM | POA: Diagnosis not present

## 2023-06-16 DIAGNOSIS — Z87891 Personal history of nicotine dependence: Secondary | ICD-10-CM | POA: Diagnosis not present

## 2023-06-16 DIAGNOSIS — E876 Hypokalemia: Secondary | ICD-10-CM | POA: Diagnosis not present

## 2023-06-16 DIAGNOSIS — N2889 Other specified disorders of kidney and ureter: Secondary | ICD-10-CM | POA: Diagnosis not present

## 2023-06-21 DIAGNOSIS — N1832 Chronic kidney disease, stage 3b: Secondary | ICD-10-CM | POA: Diagnosis not present

## 2023-06-21 DIAGNOSIS — I10 Essential (primary) hypertension: Secondary | ICD-10-CM | POA: Diagnosis not present

## 2023-06-21 DIAGNOSIS — N2889 Other specified disorders of kidney and ureter: Secondary | ICD-10-CM | POA: Diagnosis not present

## 2023-06-21 DIAGNOSIS — I130 Hypertensive heart and chronic kidney disease with heart failure and stage 1 through stage 4 chronic kidney disease, or unspecified chronic kidney disease: Secondary | ICD-10-CM | POA: Diagnosis not present

## 2023-06-21 DIAGNOSIS — J986 Disorders of diaphragm: Secondary | ICD-10-CM | POA: Diagnosis not present

## 2023-06-21 DIAGNOSIS — H409 Unspecified glaucoma: Secondary | ICD-10-CM | POA: Diagnosis not present

## 2023-06-21 DIAGNOSIS — D62 Acute posthemorrhagic anemia: Secondary | ICD-10-CM | POA: Diagnosis not present

## 2023-06-21 DIAGNOSIS — H3530 Unspecified macular degeneration: Secondary | ICD-10-CM | POA: Diagnosis not present

## 2023-06-21 DIAGNOSIS — R81 Glycosuria: Secondary | ICD-10-CM | POA: Diagnosis not present

## 2023-06-21 DIAGNOSIS — M1990 Unspecified osteoarthritis, unspecified site: Secondary | ICD-10-CM | POA: Diagnosis not present

## 2023-06-21 DIAGNOSIS — I5032 Chronic diastolic (congestive) heart failure: Secondary | ICD-10-CM | POA: Diagnosis not present

## 2023-06-21 DIAGNOSIS — Z87891 Personal history of nicotine dependence: Secondary | ICD-10-CM | POA: Diagnosis not present

## 2023-06-21 DIAGNOSIS — E782 Mixed hyperlipidemia: Secondary | ICD-10-CM | POA: Diagnosis not present

## 2023-06-21 DIAGNOSIS — Z9181 History of falling: Secondary | ICD-10-CM | POA: Diagnosis not present

## 2023-06-21 DIAGNOSIS — K922 Gastrointestinal hemorrhage, unspecified: Secondary | ICD-10-CM | POA: Diagnosis not present

## 2023-06-21 DIAGNOSIS — R296 Repeated falls: Secondary | ICD-10-CM | POA: Diagnosis not present

## 2023-06-21 DIAGNOSIS — I480 Paroxysmal atrial fibrillation: Secondary | ICD-10-CM | POA: Diagnosis not present

## 2023-06-21 DIAGNOSIS — G629 Polyneuropathy, unspecified: Secondary | ICD-10-CM | POA: Diagnosis not present

## 2023-06-21 DIAGNOSIS — K5904 Chronic idiopathic constipation: Secondary | ICD-10-CM | POA: Diagnosis not present

## 2023-06-21 DIAGNOSIS — E876 Hypokalemia: Secondary | ICD-10-CM | POA: Diagnosis not present

## 2023-06-23 DIAGNOSIS — Z9181 History of falling: Secondary | ICD-10-CM | POA: Diagnosis not present

## 2023-06-23 DIAGNOSIS — I130 Hypertensive heart and chronic kidney disease with heart failure and stage 1 through stage 4 chronic kidney disease, or unspecified chronic kidney disease: Secondary | ICD-10-CM | POA: Diagnosis not present

## 2023-06-23 DIAGNOSIS — R296 Repeated falls: Secondary | ICD-10-CM | POA: Diagnosis not present

## 2023-06-23 DIAGNOSIS — H409 Unspecified glaucoma: Secondary | ICD-10-CM | POA: Diagnosis not present

## 2023-06-23 DIAGNOSIS — M1990 Unspecified osteoarthritis, unspecified site: Secondary | ICD-10-CM | POA: Diagnosis not present

## 2023-06-23 DIAGNOSIS — N1832 Chronic kidney disease, stage 3b: Secondary | ICD-10-CM | POA: Diagnosis not present

## 2023-06-23 DIAGNOSIS — D62 Acute posthemorrhagic anemia: Secondary | ICD-10-CM | POA: Diagnosis not present

## 2023-06-23 DIAGNOSIS — K922 Gastrointestinal hemorrhage, unspecified: Secondary | ICD-10-CM | POA: Diagnosis not present

## 2023-06-23 DIAGNOSIS — Z87891 Personal history of nicotine dependence: Secondary | ICD-10-CM | POA: Diagnosis not present

## 2023-06-23 DIAGNOSIS — E782 Mixed hyperlipidemia: Secondary | ICD-10-CM | POA: Diagnosis not present

## 2023-06-23 DIAGNOSIS — G629 Polyneuropathy, unspecified: Secondary | ICD-10-CM | POA: Diagnosis not present

## 2023-06-23 DIAGNOSIS — I5032 Chronic diastolic (congestive) heart failure: Secondary | ICD-10-CM | POA: Diagnosis not present

## 2023-06-23 DIAGNOSIS — E876 Hypokalemia: Secondary | ICD-10-CM | POA: Diagnosis not present

## 2023-06-23 DIAGNOSIS — J986 Disorders of diaphragm: Secondary | ICD-10-CM | POA: Diagnosis not present

## 2023-06-23 DIAGNOSIS — I480 Paroxysmal atrial fibrillation: Secondary | ICD-10-CM | POA: Diagnosis not present

## 2023-06-23 DIAGNOSIS — K5904 Chronic idiopathic constipation: Secondary | ICD-10-CM | POA: Diagnosis not present

## 2023-06-23 DIAGNOSIS — H3530 Unspecified macular degeneration: Secondary | ICD-10-CM | POA: Diagnosis not present

## 2023-06-23 DIAGNOSIS — R81 Glycosuria: Secondary | ICD-10-CM | POA: Diagnosis not present

## 2023-06-23 DIAGNOSIS — N2889 Other specified disorders of kidney and ureter: Secondary | ICD-10-CM | POA: Diagnosis not present

## 2023-06-27 DIAGNOSIS — I130 Hypertensive heart and chronic kidney disease with heart failure and stage 1 through stage 4 chronic kidney disease, or unspecified chronic kidney disease: Secondary | ICD-10-CM | POA: Diagnosis not present

## 2023-06-27 DIAGNOSIS — I480 Paroxysmal atrial fibrillation: Secondary | ICD-10-CM | POA: Diagnosis not present

## 2023-06-27 DIAGNOSIS — E782 Mixed hyperlipidemia: Secondary | ICD-10-CM | POA: Diagnosis not present

## 2023-06-27 DIAGNOSIS — H3530 Unspecified macular degeneration: Secondary | ICD-10-CM | POA: Diagnosis not present

## 2023-06-27 DIAGNOSIS — I5032 Chronic diastolic (congestive) heart failure: Secondary | ICD-10-CM | POA: Diagnosis not present

## 2023-06-27 DIAGNOSIS — E876 Hypokalemia: Secondary | ICD-10-CM | POA: Diagnosis not present

## 2023-06-27 DIAGNOSIS — D62 Acute posthemorrhagic anemia: Secondary | ICD-10-CM | POA: Diagnosis not present

## 2023-06-27 DIAGNOSIS — N2889 Other specified disorders of kidney and ureter: Secondary | ICD-10-CM | POA: Diagnosis not present

## 2023-06-27 DIAGNOSIS — H409 Unspecified glaucoma: Secondary | ICD-10-CM | POA: Diagnosis not present

## 2023-06-27 DIAGNOSIS — J986 Disorders of diaphragm: Secondary | ICD-10-CM | POA: Diagnosis not present

## 2023-06-27 DIAGNOSIS — K922 Gastrointestinal hemorrhage, unspecified: Secondary | ICD-10-CM | POA: Diagnosis not present

## 2023-06-27 DIAGNOSIS — R296 Repeated falls: Secondary | ICD-10-CM | POA: Diagnosis not present

## 2023-06-27 DIAGNOSIS — M1990 Unspecified osteoarthritis, unspecified site: Secondary | ICD-10-CM | POA: Diagnosis not present

## 2023-06-27 DIAGNOSIS — K5904 Chronic idiopathic constipation: Secondary | ICD-10-CM | POA: Diagnosis not present

## 2023-06-27 DIAGNOSIS — G629 Polyneuropathy, unspecified: Secondary | ICD-10-CM | POA: Diagnosis not present

## 2023-06-27 DIAGNOSIS — Z87891 Personal history of nicotine dependence: Secondary | ICD-10-CM | POA: Diagnosis not present

## 2023-06-27 DIAGNOSIS — R81 Glycosuria: Secondary | ICD-10-CM | POA: Diagnosis not present

## 2023-06-27 DIAGNOSIS — Z9181 History of falling: Secondary | ICD-10-CM | POA: Diagnosis not present

## 2023-06-27 DIAGNOSIS — N1832 Chronic kidney disease, stage 3b: Secondary | ICD-10-CM | POA: Diagnosis not present

## 2023-06-28 DIAGNOSIS — I10 Essential (primary) hypertension: Secondary | ICD-10-CM | POA: Diagnosis not present

## 2023-07-05 DIAGNOSIS — Z8719 Personal history of other diseases of the digestive system: Secondary | ICD-10-CM | POA: Diagnosis not present

## 2023-07-05 DIAGNOSIS — I4891 Unspecified atrial fibrillation: Secondary | ICD-10-CM | POA: Diagnosis not present

## 2023-07-05 DIAGNOSIS — I480 Paroxysmal atrial fibrillation: Secondary | ICD-10-CM | POA: Diagnosis not present

## 2023-07-05 DIAGNOSIS — J986 Disorders of diaphragm: Secondary | ICD-10-CM | POA: Diagnosis not present

## 2023-07-05 DIAGNOSIS — I5032 Chronic diastolic (congestive) heart failure: Secondary | ICD-10-CM | POA: Diagnosis not present

## 2023-07-05 DIAGNOSIS — R81 Glycosuria: Secondary | ICD-10-CM | POA: Diagnosis not present

## 2023-07-05 DIAGNOSIS — K922 Gastrointestinal hemorrhage, unspecified: Secondary | ICD-10-CM | POA: Diagnosis not present

## 2023-07-05 DIAGNOSIS — I5030 Unspecified diastolic (congestive) heart failure: Secondary | ICD-10-CM | POA: Diagnosis not present

## 2023-07-05 DIAGNOSIS — E782 Mixed hyperlipidemia: Secondary | ICD-10-CM | POA: Diagnosis not present

## 2023-07-05 DIAGNOSIS — D62 Acute posthemorrhagic anemia: Secondary | ICD-10-CM | POA: Diagnosis not present

## 2023-07-05 DIAGNOSIS — H409 Unspecified glaucoma: Secondary | ICD-10-CM | POA: Diagnosis not present

## 2023-07-05 DIAGNOSIS — H3530 Unspecified macular degeneration: Secondary | ICD-10-CM | POA: Diagnosis not present

## 2023-07-05 DIAGNOSIS — R296 Repeated falls: Secondary | ICD-10-CM | POA: Diagnosis not present

## 2023-07-05 DIAGNOSIS — Z9181 History of falling: Secondary | ICD-10-CM | POA: Diagnosis not present

## 2023-07-05 DIAGNOSIS — M1990 Unspecified osteoarthritis, unspecified site: Secondary | ICD-10-CM | POA: Diagnosis not present

## 2023-07-05 DIAGNOSIS — E876 Hypokalemia: Secondary | ICD-10-CM | POA: Diagnosis not present

## 2023-07-05 DIAGNOSIS — I130 Hypertensive heart and chronic kidney disease with heart failure and stage 1 through stage 4 chronic kidney disease, or unspecified chronic kidney disease: Secondary | ICD-10-CM | POA: Diagnosis not present

## 2023-07-05 DIAGNOSIS — Z87891 Personal history of nicotine dependence: Secondary | ICD-10-CM | POA: Diagnosis not present

## 2023-07-05 DIAGNOSIS — K5904 Chronic idiopathic constipation: Secondary | ICD-10-CM | POA: Diagnosis not present

## 2023-07-05 DIAGNOSIS — N1832 Chronic kidney disease, stage 3b: Secondary | ICD-10-CM | POA: Diagnosis not present

## 2023-07-05 DIAGNOSIS — N2889 Other specified disorders of kidney and ureter: Secondary | ICD-10-CM | POA: Diagnosis not present

## 2023-07-05 DIAGNOSIS — G629 Polyneuropathy, unspecified: Secondary | ICD-10-CM | POA: Diagnosis not present

## 2023-07-07 DIAGNOSIS — F0392 Unspecified dementia, unspecified severity, with psychotic disturbance: Secondary | ICD-10-CM | POA: Diagnosis not present

## 2023-07-12 DIAGNOSIS — Z79899 Other long term (current) drug therapy: Secondary | ICD-10-CM | POA: Diagnosis not present

## 2023-07-12 DIAGNOSIS — I509 Heart failure, unspecified: Secondary | ICD-10-CM | POA: Diagnosis not present

## 2023-07-13 DIAGNOSIS — J986 Disorders of diaphragm: Secondary | ICD-10-CM | POA: Diagnosis not present

## 2023-07-13 DIAGNOSIS — E782 Mixed hyperlipidemia: Secondary | ICD-10-CM | POA: Diagnosis not present

## 2023-07-13 DIAGNOSIS — Z79899 Other long term (current) drug therapy: Secondary | ICD-10-CM | POA: Diagnosis not present

## 2023-07-13 DIAGNOSIS — G319 Degenerative disease of nervous system, unspecified: Secondary | ICD-10-CM | POA: Diagnosis not present

## 2023-07-13 DIAGNOSIS — Z743 Need for continuous supervision: Secondary | ICD-10-CM | POA: Diagnosis not present

## 2023-07-13 DIAGNOSIS — I4510 Unspecified right bundle-branch block: Secondary | ICD-10-CM | POA: Diagnosis not present

## 2023-07-13 DIAGNOSIS — Z7984 Long term (current) use of oral hypoglycemic drugs: Secondary | ICD-10-CM | POA: Diagnosis not present

## 2023-07-13 DIAGNOSIS — R9431 Abnormal electrocardiogram [ECG] [EKG]: Secondary | ICD-10-CM | POA: Diagnosis not present

## 2023-07-13 DIAGNOSIS — Z87891 Personal history of nicotine dependence: Secondary | ICD-10-CM | POA: Diagnosis not present

## 2023-07-13 DIAGNOSIS — R531 Weakness: Secondary | ICD-10-CM | POA: Diagnosis not present

## 2023-07-13 DIAGNOSIS — R9082 White matter disease, unspecified: Secondary | ICD-10-CM | POA: Diagnosis not present

## 2023-07-13 DIAGNOSIS — Z888 Allergy status to other drugs, medicaments and biological substances status: Secondary | ICD-10-CM | POA: Diagnosis not present

## 2023-07-13 DIAGNOSIS — J9811 Atelectasis: Secondary | ICD-10-CM | POA: Diagnosis not present

## 2023-07-13 DIAGNOSIS — F03918 Unspecified dementia, unspecified severity, with other behavioral disturbance: Secondary | ICD-10-CM | POA: Diagnosis not present

## 2023-07-13 DIAGNOSIS — I10 Essential (primary) hypertension: Secondary | ICD-10-CM | POA: Diagnosis not present

## 2023-07-13 DIAGNOSIS — F03B2 Unspecified dementia, moderate, with psychotic disturbance: Secondary | ICD-10-CM | POA: Diagnosis not present

## 2023-07-13 DIAGNOSIS — I440 Atrioventricular block, first degree: Secondary | ICD-10-CM | POA: Diagnosis not present

## 2023-07-14 DIAGNOSIS — R81 Glycosuria: Secondary | ICD-10-CM | POA: Diagnosis not present

## 2023-07-14 DIAGNOSIS — I130 Hypertensive heart and chronic kidney disease with heart failure and stage 1 through stage 4 chronic kidney disease, or unspecified chronic kidney disease: Secondary | ICD-10-CM | POA: Diagnosis not present

## 2023-07-14 DIAGNOSIS — I480 Paroxysmal atrial fibrillation: Secondary | ICD-10-CM | POA: Diagnosis not present

## 2023-07-14 DIAGNOSIS — M1990 Unspecified osteoarthritis, unspecified site: Secondary | ICD-10-CM | POA: Diagnosis not present

## 2023-07-14 DIAGNOSIS — H409 Unspecified glaucoma: Secondary | ICD-10-CM | POA: Diagnosis not present

## 2023-07-14 DIAGNOSIS — J986 Disorders of diaphragm: Secondary | ICD-10-CM | POA: Diagnosis not present

## 2023-07-14 DIAGNOSIS — K5904 Chronic idiopathic constipation: Secondary | ICD-10-CM | POA: Diagnosis not present

## 2023-07-14 DIAGNOSIS — I5032 Chronic diastolic (congestive) heart failure: Secondary | ICD-10-CM | POA: Diagnosis not present

## 2023-07-14 DIAGNOSIS — N1832 Chronic kidney disease, stage 3b: Secondary | ICD-10-CM | POA: Diagnosis not present

## 2023-07-14 DIAGNOSIS — D62 Acute posthemorrhagic anemia: Secondary | ICD-10-CM | POA: Diagnosis not present

## 2023-07-14 DIAGNOSIS — N2889 Other specified disorders of kidney and ureter: Secondary | ICD-10-CM | POA: Diagnosis not present

## 2023-07-14 DIAGNOSIS — H3530 Unspecified macular degeneration: Secondary | ICD-10-CM | POA: Diagnosis not present

## 2023-07-14 DIAGNOSIS — Z9181 History of falling: Secondary | ICD-10-CM | POA: Diagnosis not present

## 2023-07-14 DIAGNOSIS — K922 Gastrointestinal hemorrhage, unspecified: Secondary | ICD-10-CM | POA: Diagnosis not present

## 2023-07-14 DIAGNOSIS — Z87891 Personal history of nicotine dependence: Secondary | ICD-10-CM | POA: Diagnosis not present

## 2023-07-14 DIAGNOSIS — E782 Mixed hyperlipidemia: Secondary | ICD-10-CM | POA: Diagnosis not present

## 2023-07-14 DIAGNOSIS — E876 Hypokalemia: Secondary | ICD-10-CM | POA: Diagnosis not present

## 2023-07-14 DIAGNOSIS — G629 Polyneuropathy, unspecified: Secondary | ICD-10-CM | POA: Diagnosis not present

## 2023-07-14 DIAGNOSIS — R296 Repeated falls: Secondary | ICD-10-CM | POA: Diagnosis not present

## 2023-07-19 DIAGNOSIS — G629 Polyneuropathy, unspecified: Secondary | ICD-10-CM | POA: Diagnosis not present

## 2023-07-19 DIAGNOSIS — R81 Glycosuria: Secondary | ICD-10-CM | POA: Diagnosis not present

## 2023-07-19 DIAGNOSIS — N2889 Other specified disorders of kidney and ureter: Secondary | ICD-10-CM | POA: Diagnosis not present

## 2023-07-19 DIAGNOSIS — Z87891 Personal history of nicotine dependence: Secondary | ICD-10-CM | POA: Diagnosis not present

## 2023-07-19 DIAGNOSIS — M1990 Unspecified osteoarthritis, unspecified site: Secondary | ICD-10-CM | POA: Diagnosis not present

## 2023-07-19 DIAGNOSIS — I5030 Unspecified diastolic (congestive) heart failure: Secondary | ICD-10-CM | POA: Diagnosis not present

## 2023-07-19 DIAGNOSIS — H409 Unspecified glaucoma: Secondary | ICD-10-CM | POA: Diagnosis not present

## 2023-07-19 DIAGNOSIS — E782 Mixed hyperlipidemia: Secondary | ICD-10-CM | POA: Diagnosis not present

## 2023-07-19 DIAGNOSIS — E876 Hypokalemia: Secondary | ICD-10-CM | POA: Diagnosis not present

## 2023-07-19 DIAGNOSIS — K5904 Chronic idiopathic constipation: Secondary | ICD-10-CM | POA: Diagnosis not present

## 2023-07-19 DIAGNOSIS — I480 Paroxysmal atrial fibrillation: Secondary | ICD-10-CM | POA: Diagnosis not present

## 2023-07-19 DIAGNOSIS — R296 Repeated falls: Secondary | ICD-10-CM | POA: Diagnosis not present

## 2023-07-19 DIAGNOSIS — J986 Disorders of diaphragm: Secondary | ICD-10-CM | POA: Diagnosis not present

## 2023-07-19 DIAGNOSIS — I130 Hypertensive heart and chronic kidney disease with heart failure and stage 1 through stage 4 chronic kidney disease, or unspecified chronic kidney disease: Secondary | ICD-10-CM | POA: Diagnosis not present

## 2023-07-19 DIAGNOSIS — M7989 Other specified soft tissue disorders: Secondary | ICD-10-CM | POA: Diagnosis not present

## 2023-07-19 DIAGNOSIS — D62 Acute posthemorrhagic anemia: Secondary | ICD-10-CM | POA: Diagnosis not present

## 2023-07-19 DIAGNOSIS — K922 Gastrointestinal hemorrhage, unspecified: Secondary | ICD-10-CM | POA: Diagnosis not present

## 2023-07-19 DIAGNOSIS — Z9181 History of falling: Secondary | ICD-10-CM | POA: Diagnosis not present

## 2023-07-19 DIAGNOSIS — I5032 Chronic diastolic (congestive) heart failure: Secondary | ICD-10-CM | POA: Diagnosis not present

## 2023-07-19 DIAGNOSIS — N1832 Chronic kidney disease, stage 3b: Secondary | ICD-10-CM | POA: Diagnosis not present

## 2023-07-19 DIAGNOSIS — H3530 Unspecified macular degeneration: Secondary | ICD-10-CM | POA: Diagnosis not present

## 2023-07-27 DIAGNOSIS — Z7984 Long term (current) use of oral hypoglycemic drugs: Secondary | ICD-10-CM | POA: Diagnosis not present

## 2023-07-27 DIAGNOSIS — D62 Acute posthemorrhagic anemia: Secondary | ICD-10-CM | POA: Diagnosis not present

## 2023-07-27 DIAGNOSIS — N1831 Chronic kidney disease, stage 3a: Secondary | ICD-10-CM | POA: Diagnosis not present

## 2023-07-27 DIAGNOSIS — Z8719 Personal history of other diseases of the digestive system: Secondary | ICD-10-CM | POA: Diagnosis not present

## 2023-07-27 DIAGNOSIS — I130 Hypertensive heart and chronic kidney disease with heart failure and stage 1 through stage 4 chronic kidney disease, or unspecified chronic kidney disease: Secondary | ICD-10-CM | POA: Diagnosis not present

## 2023-07-27 DIAGNOSIS — I4891 Unspecified atrial fibrillation: Secondary | ICD-10-CM | POA: Diagnosis not present

## 2023-07-27 DIAGNOSIS — G629 Polyneuropathy, unspecified: Secondary | ICD-10-CM | POA: Diagnosis not present

## 2023-07-27 DIAGNOSIS — I5032 Chronic diastolic (congestive) heart failure: Secondary | ICD-10-CM | POA: Diagnosis not present

## 2023-08-04 DIAGNOSIS — H353211 Exudative age-related macular degeneration, right eye, with active choroidal neovascularization: Secondary | ICD-10-CM | POA: Diagnosis not present

## 2023-08-18 DIAGNOSIS — D509 Iron deficiency anemia, unspecified: Secondary | ICD-10-CM | POA: Diagnosis not present

## 2023-08-29 ENCOUNTER — Telehealth: Payer: Self-pay | Admitting: Cardiology

## 2023-08-29 NOTE — Telephone Encounter (Signed)
Left message to call office

## 2023-08-29 NOTE — Telephone Encounter (Signed)
I spoke with Toni Amend and gave her message from Dr Odis Hollingshead.  Will fax this phone note to [facility].   Fax number [phone]  Results will be faxed to Dr [phone].

## 2023-08-29 NOTE — Telephone Encounter (Signed)
Restart Entresto and labs in one week - BMP, NT-pro BNP, Mg level.   Slyvia Lartigue [place], DO, [facility]

## 2023-08-29 NOTE — Telephone Encounter (Signed)
Pt c/o medication issue:  1. Name of Medication: ENTRESTO 24-26 MG   2. How are you currently taking this medication (dosage and times per day)?   3. Are you having a reaction (difficulty breathing--STAT)?   4. What is your medication issue? Courtney at [facility] stating pt hasn't been taking this medication due to an error in their refill system. She wants to know how should they proceed in terms of lab work, etc. Please advise.

## 2023-08-29 NOTE — Telephone Encounter (Signed)
Shane Knight from [facility] was returning call and is requesting a callback. Please advise

## 2023-08-29 NOTE — Telephone Encounter (Signed)
[phone] --Per scheduling call back should be placed to this number.   I spoke with Toni Amend.  Patient has been at facility since August 22.  It was noticed yesterday he has not been receiving Entresto due to a transcribing error in [facility] system.  Last dose was August 22.  Toni Amend is asking if OK to resume Entestro 24-26 mg twice daily or if patient needs lab work prior to resuming.  Call back should go to [phone]

## 2023-09-02 DIAGNOSIS — N1831 Chronic kidney disease, stage 3a: Secondary | ICD-10-CM | POA: Diagnosis not present

## 2023-09-02 DIAGNOSIS — I5032 Chronic diastolic (congestive) heart failure: Secondary | ICD-10-CM | POA: Diagnosis not present

## 2023-09-06 DIAGNOSIS — I10 Essential (primary) hypertension: Secondary | ICD-10-CM | POA: Diagnosis not present

## 2023-09-06 DIAGNOSIS — I4891 Unspecified atrial fibrillation: Secondary | ICD-10-CM | POA: Diagnosis not present

## 2023-09-06 DIAGNOSIS — R0602 Shortness of breath: Secondary | ICD-10-CM | POA: Diagnosis not present

## 2023-09-06 DIAGNOSIS — N183 Chronic kidney disease, stage 3 unspecified: Secondary | ICD-10-CM | POA: Diagnosis not present

## 2023-09-09 LAB — LAB REPORT - SCANNED: EGFR: 49

## 2023-09-15 DIAGNOSIS — D509 Iron deficiency anemia, unspecified: Secondary | ICD-10-CM | POA: Diagnosis not present

## 2023-09-22 DIAGNOSIS — N390 Urinary tract infection, site not specified: Secondary | ICD-10-CM | POA: Diagnosis not present

## 2023-09-25 NOTE — Progress Notes (Signed)
External Labs: Collected: 09/06/2023 provided by patient's facility. BUN 23, creatinine 1.32. eGFR 49. Potassium 3.8. Magnesium 2.4. NT proBNP 137  These labs should reflect him starting Entresto 24 to 26 mg p.o. twice daily.  Please refer to prior telephone encounters for details.  Continue Entresto as long as patient is tolerating it without any side effects or intolerances.  [place] Bellemare Baldwin, DO, [facility]

## 2023-09-28 DIAGNOSIS — I5032 Chronic diastolic (congestive) heart failure: Secondary | ICD-10-CM | POA: Diagnosis not present

## 2023-09-29 DIAGNOSIS — J90 Pleural effusion, not elsewhere classified: Secondary | ICD-10-CM | POA: Diagnosis not present

## 2023-09-29 DIAGNOSIS — G4751 Confusional arousals: Secondary | ICD-10-CM | POA: Diagnosis not present

## 2023-09-30 DIAGNOSIS — D631 Anemia in chronic kidney disease: Secondary | ICD-10-CM | POA: Diagnosis not present

## 2023-09-30 DIAGNOSIS — I5032 Chronic diastolic (congestive) heart failure: Secondary | ICD-10-CM | POA: Diagnosis not present

## 2023-09-30 DIAGNOSIS — I130 Hypertensive heart and chronic kidney disease with heart failure and stage 1 through stage 4 chronic kidney disease, or unspecified chronic kidney disease: Secondary | ICD-10-CM | POA: Diagnosis not present

## 2023-09-30 DIAGNOSIS — N1832 Chronic kidney disease, stage 3b: Secondary | ICD-10-CM | POA: Diagnosis not present

## 2023-10-04 DIAGNOSIS — G8929 Other chronic pain: Secondary | ICD-10-CM | POA: Diagnosis not present

## 2023-10-04 DIAGNOSIS — I5032 Chronic diastolic (congestive) heart failure: Secondary | ICD-10-CM | POA: Diagnosis not present

## 2023-10-04 DIAGNOSIS — I130 Hypertensive heart and chronic kidney disease with heart failure and stage 1 through stage 4 chronic kidney disease, or unspecified chronic kidney disease: Secondary | ICD-10-CM | POA: Diagnosis not present

## 2023-10-04 DIAGNOSIS — D631 Anemia in chronic kidney disease: Secondary | ICD-10-CM | POA: Diagnosis not present

## 2023-10-04 DIAGNOSIS — N1832 Chronic kidney disease, stage 3b: Secondary | ICD-10-CM | POA: Diagnosis not present

## 2023-10-11 DIAGNOSIS — N1832 Chronic kidney disease, stage 3b: Secondary | ICD-10-CM | POA: Diagnosis not present

## 2023-10-11 DIAGNOSIS — I130 Hypertensive heart and chronic kidney disease with heart failure and stage 1 through stage 4 chronic kidney disease, or unspecified chronic kidney disease: Secondary | ICD-10-CM | POA: Diagnosis not present

## 2023-10-11 DIAGNOSIS — D631 Anemia in chronic kidney disease: Secondary | ICD-10-CM | POA: Diagnosis not present

## 2023-10-11 DIAGNOSIS — I5032 Chronic diastolic (congestive) heart failure: Secondary | ICD-10-CM | POA: Diagnosis not present

## 2023-10-12 DIAGNOSIS — I5032 Chronic diastolic (congestive) heart failure: Secondary | ICD-10-CM | POA: Diagnosis not present

## 2023-10-12 DIAGNOSIS — N1832 Chronic kidney disease, stage 3b: Secondary | ICD-10-CM | POA: Diagnosis not present

## 2023-10-12 DIAGNOSIS — I130 Hypertensive heart and chronic kidney disease with heart failure and stage 1 through stage 4 chronic kidney disease, or unspecified chronic kidney disease: Secondary | ICD-10-CM | POA: Diagnosis not present

## 2023-10-12 DIAGNOSIS — D631 Anemia in chronic kidney disease: Secondary | ICD-10-CM | POA: Diagnosis not present

## 2023-10-13 ENCOUNTER — Other Ambulatory Visit: Payer: Self-pay

## 2023-10-13 ENCOUNTER — Emergency Department (HOSPITAL_COMMUNITY)
Admission: EM | Admit: 2023-10-13 | Discharge: 2023-10-14 | Disposition: A | Discharge status: Home / Self Care | Payer: Medicare Other | Attending: Emergency Medicine | Admitting: Emergency Medicine

## 2023-10-13 ENCOUNTER — Emergency Department (HOSPITAL_COMMUNITY): Payer: Medicare Other

## 2023-10-13 ENCOUNTER — Encounter (HOSPITAL_COMMUNITY): Payer: Self-pay

## 2023-10-13 DIAGNOSIS — S3992XA Unspecified injury of lower back, initial encounter: Secondary | ICD-10-CM | POA: Diagnosis not present

## 2023-10-13 DIAGNOSIS — N189 Chronic kidney disease, unspecified: Secondary | ICD-10-CM | POA: Diagnosis not present

## 2023-10-13 DIAGNOSIS — Z471 Aftercare following joint replacement surgery: Secondary | ICD-10-CM | POA: Diagnosis not present

## 2023-10-13 DIAGNOSIS — Z7901 Long term (current) use of anticoagulants: Secondary | ICD-10-CM | POA: Diagnosis not present

## 2023-10-13 DIAGNOSIS — R600 Localized edema: Secondary | ICD-10-CM | POA: Insufficient documentation

## 2023-10-13 DIAGNOSIS — I5031 Acute diastolic (congestive) heart failure: Secondary | ICD-10-CM

## 2023-10-13 DIAGNOSIS — I10 Essential (primary) hypertension: Secondary | ICD-10-CM | POA: Diagnosis not present

## 2023-10-13 DIAGNOSIS — M25519 Pain in unspecified shoulder: Secondary | ICD-10-CM | POA: Diagnosis not present

## 2023-10-13 DIAGNOSIS — M542 Cervicalgia: Secondary | ICD-10-CM | POA: Diagnosis not present

## 2023-10-13 DIAGNOSIS — I509 Heart failure, unspecified: Secondary | ICD-10-CM | POA: Diagnosis not present

## 2023-10-13 DIAGNOSIS — S0990XA Unspecified injury of head, initial encounter: Secondary | ICD-10-CM | POA: Diagnosis not present

## 2023-10-13 DIAGNOSIS — I493 Ventricular premature depolarization: Secondary | ICD-10-CM

## 2023-10-13 DIAGNOSIS — K5730 Diverticulosis of large intestine without perforation or abscess without bleeding: Secondary | ICD-10-CM | POA: Diagnosis not present

## 2023-10-13 DIAGNOSIS — M25511 Pain in right shoulder: Secondary | ICD-10-CM | POA: Diagnosis not present

## 2023-10-13 DIAGNOSIS — I700 Atherosclerosis of aorta: Secondary | ICD-10-CM | POA: Diagnosis not present

## 2023-10-13 DIAGNOSIS — Z96611 Presence of right artificial shoulder joint: Secondary | ICD-10-CM | POA: Diagnosis not present

## 2023-10-13 DIAGNOSIS — R5381 Other malaise: Secondary | ICD-10-CM | POA: Diagnosis not present

## 2023-10-13 DIAGNOSIS — W010XXA Fall on same level from slipping, tripping and stumbling without subsequent striking against object, initial encounter: Secondary | ICD-10-CM | POA: Insufficient documentation

## 2023-10-13 DIAGNOSIS — M47816 Spondylosis without myelopathy or radiculopathy, lumbar region: Secondary | ICD-10-CM | POA: Diagnosis not present

## 2023-10-13 DIAGNOSIS — R519 Headache, unspecified: Secondary | ICD-10-CM | POA: Diagnosis not present

## 2023-10-13 DIAGNOSIS — M5126 Other intervertebral disc displacement, lumbar region: Secondary | ICD-10-CM | POA: Diagnosis not present

## 2023-10-13 DIAGNOSIS — I6529 Occlusion and stenosis of unspecified carotid artery: Secondary | ICD-10-CM | POA: Diagnosis not present

## 2023-10-13 DIAGNOSIS — S199XXA Unspecified injury of neck, initial encounter: Secondary | ICD-10-CM | POA: Diagnosis not present

## 2023-10-13 DIAGNOSIS — W19XXXA Unspecified fall, initial encounter: Secondary | ICD-10-CM | POA: Diagnosis not present

## 2023-10-13 DIAGNOSIS — Y92009 Unspecified place in unspecified non-institutional (private) residence as the place of occurrence of the external cause: Secondary | ICD-10-CM

## 2023-10-13 DIAGNOSIS — M549 Dorsalgia, unspecified: Secondary | ICD-10-CM | POA: Diagnosis not present

## 2023-10-13 HISTORY — DX: Unspecified dementia, unspecified severity, without behavioral disturbance, psychotic disturbance, mood disturbance, and anxiety: F03.90

## 2023-10-13 LAB — CBC WITH DIFFERENTIAL/PLATELET
Abs Immature Granulocytes: 0.03 10*3/uL (ref 0.00–0.07)
Basophils Absolute: 0.1 10*3/uL (ref 0.0–0.1)
Basophils Relative: 1 %
Eosinophils Absolute: 0.4 10*3/uL (ref 0.0–0.5)
Eosinophils Relative: 6 %
HCT: 50.5 % (ref 39.0–52.0)
Hemoglobin: 15.9 g/dL (ref 13.0–17.0)
Immature Granulocytes: 0 %
Lymphocytes Relative: 20 %
Lymphs Abs: 1.3 10*3/uL (ref 0.7–4.0)
MCH: 28.7 pg (ref 26.0–34.0)
MCHC: 31.5 g/dL (ref 30.0–36.0)
MCV: 91.2 fL (ref 80.0–100.0)
Monocytes Absolute: 0.8 10*3/uL (ref 0.1–1.0)
Monocytes Relative: 12 %
Neutro Abs: 4.2 10*3/uL (ref 1.7–7.7)
Neutrophils Relative %: 61 %
Platelets: 154 10*3/uL (ref 150–400)
RBC: 5.54 MIL/uL (ref 4.22–5.81)
RDW: 17.5 % — ABNORMAL HIGH (ref 11.5–15.5)
WBC: 6.8 10*3/uL (ref 4.0–10.5)
nRBC: 0 % (ref 0.0–0.2)

## 2023-10-13 LAB — BASIC METABOLIC PANEL
Anion gap: 7 (ref 5–15)
BUN: 21 mg/dL (ref 8–23)
CO2: 27 mmol/L (ref 22–32)
Calcium: 9.4 mg/dL (ref 8.9–10.3)
Chloride: 108 mmol/L (ref 98–111)
Creatinine, Ser: 1.41 mg/dL — ABNORMAL HIGH (ref 0.61–1.24)
GFR, Estimated: 46 mL/min — ABNORMAL LOW (ref >60)
Glucose, Bld: 103 mg/dL — ABNORMAL HIGH (ref 70–99)
Potassium: 4.1 mmol/L (ref 3.5–5.1)
Sodium: 142 mmol/L (ref 135–145)

## 2023-10-13 LAB — URINALYSIS, W/ REFLEX TO CULTURE (INFECTION SUSPECTED)
Bacteria, UA: NONE SEEN
Bilirubin Urine: NEGATIVE
Glucose, UA: >=500 mg/dL — AB
Hgb urine dipstick: NEGATIVE
Ketones, ur: NEGATIVE mg/dL
Leukocytes,Ua: NEGATIVE
Nitrite: NEGATIVE
Protein, ur: NEGATIVE mg/dL
Specific Gravity, Urine: 1.005 (ref 1.005–1.030)
pH: 7 (ref 5.0–8.0)

## 2023-10-13 MED ORDER — ACETAMINOPHEN 500 MG PO TABS
1000.0000 mg | ORAL_TABLET | Freq: Once | ORAL | Status: AC
  Administered 2023-10-13: 1000 mg via ORAL
  Filled 2023-10-13: qty 2

## 2023-10-13 NOTE — ED Notes (Signed)
Patient becoming increasingly confused, bed alarm in place, fall band on, grip socks on. Door open and patient moved closer to the nurses station. Patient removed IV, EDP notified and stated he did not need another one placed.

## 2023-10-13 NOTE — ED Provider Notes (Signed)
  Physical Exam  BP 116/70   Pulse (!) 50   Temp 97.8 F (36.6 C) (Oral)   Resp 18   SpO2 95%   Physical Exam  Procedures  Procedures  ED Course / MDM   Clinical Course as of 10/13/23 2335  Thu Oct 13, 2023  1614 Assumed care from Dr Anitra Lauth. 89 yo M who lives at ALF slipped and fell today. Zyprexa started last week for hallucinations and insomnia. Initially was tired from that but seemed to be improving. Was having neck, head, and R shoulder pain. CT head, c-spine, and R shoulder imaging negative. Awaiting CT of the L spine and pelvis. If negative should be able to be dc'd back. Will need to ambulate again if imaging negative.  [RP]  2010 CT scans have returned and did not reveal acute fracture.  Patient failed ambulatory trial.  Feel that he would benefit from PT evaluation.  Family is also very concerned about him going home tonight and being safe at this facility.  Discussed with TOC who will follow the patient after he sees physical therapy in the morning. Placed in TOC boarder status.  Med rec ordered. [RP]    Clinical Course User Index [RP] Rondel Baton, MD   Medical Decision Making Amount and/or Complexity of Data Reviewed Labs: ordered. Radiology: ordered.  Risk OTC drugs.      Rondel Baton, MD 10/13/23 [phone]

## 2023-10-13 NOTE — ED Provider Notes (Signed)
[place] EMERGENCY DEPARTMENT AT [facility] Provider Note   CSN: [id number] Arrival date & time: 10/13/23  1056     History  No chief complaint on file.   Shane Mcquistion Sr. is a 89 y.o. male.  Patient is a 89 year old male with a history of AAA, CKD, paroxysmal atrial fibrillation with heart failure with preserved EF no longer on anticoagulation who presents today after a fall.  Patient reports that he was in the bathroom and he started slipping on the floor and grabbed the shower curtain but fell backwards.  He complains of pain in his head, neck and right shoulder.  Patient denies any loss of consciousness and denies currently having any chest, abdominal or back pain.  Patient denies any numbness or tingling in the arms or legs.  EMS reports initially when patient stood he complained of back pain and they thought they felt the step-off which is why they came to [facility] today.  Shane Knight is present at bedside and reports that she is spoken with her father or seen him in person almost every day this week and he has seemed himself.  He started Zyprexa last week to help him get better sleep and initially was groggy but seems to have improved and she has not noticed any abnormalities with him over the last week.  The history is provided by the patient.       Home Medications Prior to Admission medications   Medication Sig Start Date End Date Taking? Authorizing Provider  acetaminophen (TYLENOL) 325 MG tablet Take 650 mg by mouth every 6 (six) hours as needed.    [provider]  bumetanide (BUMEX) 0.5 MG tablet TAKE 1 TABLET BY MOUTH EVERY MORNING. 02/15/23   Tolia, Sunit, DO  calcium carbonate (TUMS CHEWY BITES) 750 MG chewable tablet Chew 1 tablet by mouth as needed for heartburn.    [provider]  dapagliflozin propanediol (FARXIGA) 10 MG TABS tablet Take 10 mg by mouth daily.    [provider]  dextromethorphan (DELSYM) 30 MG/5ML liquid  Take by mouth at bedtime as needed.    [provider]  diltiazem (CARDIZEM CD) 120 MG 24 hr capsule TAKE 1 CAPSULE BY MOUTH EVERY DAY 04/29/22   Tolia, Sunit, DO  DULoxetine (CYMBALTA) 20 MG capsule Take 20 mg by mouth daily. 12/28/22   [provider]  ENTRESTO 24-26 MG Take 1 tablet by mouth 2 (two) times daily. 03/10/23   [provider]  guaiFENesin (MUCINEX) 600 MG 12 hr tablet Take by mouth.    [provider]  latanoprost (XALATAN) 0.005 % ophthalmic solution INSTILL 1 DROP INTO BOTH EYES EVERY DAY AT NIGHT 02/28/20   [provider]  Multiple Vitamins-Minerals (PRESERVISION AREDS 2+MULTI VIT PO) Take 2 capsules by mouth daily.    [provider]  Polyethyl Glycol-Propyl Glycol (SYSTANE) 0.4-0.3 % SOLN See admin instructions.    [provider]  polyethylene glycol (MIRALAX / GLYCOLAX) 17 g packet 1 packet mixed with 8 ounces of fluid    [provider]  pregabalin (LYRICA) 150 MG capsule Take 150 mg by mouth 2 (two) times daily. 07/29/20   [provider]  rivaroxaban (XARELTO) 15 MG TABS tablet Take 1 tablet (15 mg total) by mouth daily with supper. 04/21/23 07/20/23  Tolia, Sunit, DO      Allergies    Amlodipine; Antihistamines, loratadine-type; and Tape    Review of Systems   Review of Systems  Physical  Exam Updated Vital Signs BP 129/87   Pulse 62   Temp 97.8 F (36.6 C) (Oral)   Resp 18   SpO2 95%  Physical Exam Vitals and nursing note reviewed.  Constitutional:      General: He is not in acute distress.    Appearance: He is well-developed.  HENT:     Head: Normocephalic and atraumatic.  Eyes:     Conjunctiva/sclera: Conjunctivae normal.     Pupils: Pupils are equal, round, and reactive to light.  Cardiovascular:     Rate and Rhythm: Normal rate and regular rhythm.     Heart sounds: No murmur heard. Pulmonary:     Effort: Pulmonary effort is normal. No respiratory distress.     Breath  sounds: Normal breath sounds. No wheezing or rales.  Abdominal:     General: There is no distension.     Palpations: Abdomen is soft.     Tenderness: There is no abdominal tenderness. There is no guarding or rebound.  Musculoskeletal:        General: Tenderness present. Normal range of motion.     Right shoulder: Tenderness and bony tenderness present.     Cervical back: Normal range of motion and neck supple. Muscular tenderness present. No spinous process tenderness.     Thoracic back: Normal.     Lumbar back: Normal.     Right lower leg: Edema present.     Left lower leg: Edema present.     Comments: No step-offs of the back noted and no pain with palpation all the way down the thoracic and lumbar spine.  Able to lift his leg spontaneously.  Fully range the hips bilaterally and knees without injury or pain  Skin:    General: Skin is warm and dry.     Findings: No erythema or rash.  Neurological:     Mental Status: He is alert and oriented to person, place, and time. Mental status is at baseline.  Psychiatric:        Mood and Affect: Mood normal.        Behavior: Behavior normal.     ED Results / Procedures / Treatments   Labs (all labs ordered are listed, but only abnormal results are displayed) Labs Reviewed  CBC WITH DIFFERENTIAL/PLATELET - Abnormal; Notable for the following components:      Result Value   RDW 17.5 (*)    All other components within normal limits  BASIC METABOLIC PANEL - Abnormal; Notable for the following components:   Glucose, Bld 103 (*)    Creatinine, Ser 1.41 (*)    GFR, Estimated 46 (*)    All other components within normal limits  URINALYSIS, W/ REFLEX TO CULTURE (INFECTION SUSPECTED)    EKG None  Radiology DG Shoulder Right  Result Date: 10/13/2023 CLINICAL DATA:  Right shoulder pain after fall. EXAM: RIGHT SHOULDER - 2+ VIEW COMPARISON:  None Available. FINDINGS: Status post right shoulder arthroplasty. No fracture or dislocation is  noted. IMPRESSION: No acute abnormality seen. Electronically Signed   By: Lupita Raider M.D.   On: 10/13/2023 15:02   CT Head Wo Contrast  Result Date: 10/13/2023 CLINICAL DATA:  Head trauma, minor (Age >= 65y); Neck trauma (Age >= 65y). Fall. EXAM: CT HEAD WITHOUT CONTRAST CT CERVICAL SPINE WITHOUT CONTRAST TECHNIQUE: Multidetector CT imaging of the head and cervical spine was performed following the standard protocol without intravenous contrast. Multiplanar CT image reconstructions of the cervical spine were also generated. RADIATION DOSE  REDUCTION: This exam was performed according to the departmental dose-optimization program which includes automated exposure control, adjustment of the mA and/or kV according to patient size and/or use of iterative reconstruction technique. COMPARISON:  CT head and cervical spine 08/22/2019. FINDINGS: CT HEAD FINDINGS Brain: No acute hemorrhage. Unchanged mild chronic small-vessel disease. Cortical gray-white differentiation is otherwise preserved. Prominence of the ventricles and sulci within expected range for age. No hydrocephalus or extra-axial collection. No mass effect or midline shift. Vascular: No hyperdense vessel or unexpected calcification. Skull: No calvarial fracture or suspicious bone lesion. Skull base is unremarkable. Sinuses/Orbits: No acute finding. Other: None. CT CERVICAL SPINE FINDINGS Alignment: Unchanged 3 mm degenerative anterolisthesis of C4 on C5 and trace retrolisthesis of C6 on C7. No traumatic malalignment. Skull base and vertebrae: No acute fracture. Normal craniocervical junction. No suspicious bone lesions. Soft tissues and spinal canal: No prevertebral fluid or swelling. No visible canal hematoma. Disc levels: Multilevel cervical spondylosis, worst at C5-6, where there is at least mild spinal canal stenosis. Upper chest: No acute findings. Other: Atherosclerotic calcifications of the carotid bulbs. IMPRESSION: 1. No acute intracranial  abnormality. 2. No acute cervical spine fracture or traumatic malalignment. 3. Multilevel cervical spondylosis, worst at C5-6, where there is at least mild spinal canal stenosis. Electronically Signed   By: Orvan Falconer M.D.   On: 10/13/2023 14:15   CT Cervical Spine Wo Contrast  Result Date: 10/13/2023 CLINICAL DATA:  Head trauma, minor (Age >= 65y); Neck trauma (Age >= 65y). Fall. EXAM: CT HEAD WITHOUT CONTRAST CT CERVICAL SPINE WITHOUT CONTRAST TECHNIQUE: Multidetector CT imaging of the head and cervical spine was performed following the standard protocol without intravenous contrast. Multiplanar CT image reconstructions of the cervical spine were also generated. RADIATION DOSE REDUCTION: This exam was performed according to the departmental dose-optimization program which includes automated exposure control, adjustment of the mA and/or kV according to patient size and/or use of iterative reconstruction technique. COMPARISON:  CT head and cervical spine 08/22/2019. FINDINGS: CT HEAD FINDINGS Brain: No acute hemorrhage. Unchanged mild chronic small-vessel disease. Cortical gray-white differentiation is otherwise preserved. Prominence of the ventricles and sulci within expected range for age. No hydrocephalus or extra-axial collection. No mass effect or midline shift. Vascular: No hyperdense vessel or unexpected calcification. Skull: No calvarial fracture or suspicious bone lesion. Skull base is unremarkable. Sinuses/Orbits: No acute finding. Other: None. CT CERVICAL SPINE FINDINGS Alignment: Unchanged 3 mm degenerative anterolisthesis of C4 on C5 and trace retrolisthesis of C6 on C7. No traumatic malalignment. Skull base and vertebrae: No acute fracture. Normal craniocervical junction. No suspicious bone lesions. Soft tissues and spinal canal: No prevertebral fluid or swelling. No visible canal hematoma. Disc levels: Multilevel cervical spondylosis, worst at C5-6, where there is at least mild spinal canal  stenosis. Upper chest: No acute findings. Other: Atherosclerotic calcifications of the carotid bulbs. IMPRESSION: 1. No acute intracranial abnormality. 2. No acute cervical spine fracture or traumatic malalignment. 3. Multilevel cervical spondylosis, worst at C5-6, where there is at least mild spinal canal stenosis. Electronically Signed   By: Orvan Falconer M.D.   On: 10/13/2023 14:15    Procedures Procedures    Medications Ordered in ED Medications  acetaminophen (TYLENOL) tablet 1,000 mg (1,000 mg Oral Given 10/13/23 1445)    ED Course/ Medical Decision Making/ A&P Clinical Course as of 10/13/23 1618  Thu Oct 13, 2023  1614 Assumed care from Dr Anitra Lauth. 89 yo M who lives at ALF slipped and fell today. Zyprexa started last  week for hallucinations and insomnia. Initially was tired from that but seemed to be improving. Was having neck, head, and R shoulder pain. CT head, c-spine, and R shoulder imaging negative. Awaiting CT of the L spine and pelvis. If negative should be able to be dc'd back. Will need to ambulate again if imaging negative.  [RP]    Clinical Course User Index [RP] Rondel Baton, MD                                 Medical Decision Making Amount and/or Complexity of Data Reviewed Independent Historian: EMS    Details: Shane Knight External Data Reviewed: notes. Labs: ordered. Decision-making details documented in ED Course. Radiology: ordered and independent interpretation performed. Decision-making details documented in ED Course.  Risk OTC drugs.   Pt with multiple medical problems and comorbidities and presenting today with a complaint that caries a high risk for morbidity and mortality.  Today after a fall at his assisted living.  Patient is complaining of headache, neck pain and right shoulder pain.  He is neurovascularly intact at this time and takes no anticoagulation.  Prior to the fall it seems that he had been doing well this week per his Shane Knight who is  present at bedside.  Patient's vital signs are reassuring and his exam is reassuring.  Will rule out injury.   4:18 PM I independently interpreted patient's labs and CBC and BMP without acute findings today.  I have independently visualized and interpreted pt's images today.  Head CT without intracranial bleed today and cervical spine without fracture.  Radiology reports no acute intracranial abnormality but multilevel cervical spondylosis worse at C5-6 where there is mild spinal canal stenosis.  Right shoulder image with intact replacement without evidence of fracture.  Patient given Tylenol for aches and pains.  Will assure that he is able to stand without significant back pain or any other area that needs imaging.  All the findings discussed with the patient and his Shane Knight who is present at bedside.  4:18 PM Attempted to stand patient and he did very poorly.  He started having significant pain in bilateral hips and lower back.  He was not able to take any steps and then fell back onto the bed.  Will get further imaging of the back and pelvis to ensure no occult fracture.          Final Clinical Impression(s) / ED Diagnoses Final diagnoses:  None    Rx / DC Orders ED Discharge Orders     None         Gwyneth Sprout, MD 10/13/23 [phone]

## 2023-10-13 NOTE — ED Notes (Signed)
Trauma Event Note  Patient not leveled but ED CRN requested TRN round on patient. Patient from facility, fell today complaining of mostly neck pain. Patient does have some baseline dementia. Imaging was negative for head or neck injury. When ambulating, patient complained of severe hip/lower back pain. Imaging was added and resulted with no injuries identified. No traumatic injury found, plans for patient to return to facility.  Last imported Vital Signs BP 139/74   Pulse 62   Temp 97.8 F (36.6 C) (Oral)   Resp 18   SpO2 95%   Trending CBC Recent Labs    10/13/23 1159  WBC 6.8  HGB 15.9  HCT 50.5  PLT 154   Trending BMET Recent Labs    10/13/23 1159  NA 142  K 4.1  CL 108  CO2 27  BUN 21  CREATININE 1.41*  GLUCOSE 103*   Shane Knight Shane Knight  Trauma Response RN  Please call TRN at [phone] for further assistance.

## 2023-10-13 NOTE — ED Notes (Signed)
RN got pt up to walk and pt c/o hip and lower back pain. Pt took a couple of steps and felt like he was going to fall and fell onto bed. MD notified.

## 2023-10-13 NOTE — ED Triage Notes (Signed)
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

## 2023-10-13 NOTE — Progress Notes (Signed)
PT consult pending 

## 2023-10-14 DIAGNOSIS — I130 Hypertensive heart and chronic kidney disease with heart failure and stage 1 through stage 4 chronic kidney disease, or unspecified chronic kidney disease: Secondary | ICD-10-CM | POA: Diagnosis not present

## 2023-10-14 DIAGNOSIS — T7840XA Allergy, unspecified, initial encounter: Secondary | ICD-10-CM | POA: Diagnosis not present

## 2023-10-14 DIAGNOSIS — G629 Polyneuropathy, unspecified: Secondary | ICD-10-CM | POA: Diagnosis not present

## 2023-10-14 DIAGNOSIS — R296 Repeated falls: Secondary | ICD-10-CM | POA: Diagnosis not present

## 2023-10-14 DIAGNOSIS — I129 Hypertensive chronic kidney disease with stage 1 through stage 4 chronic kidney disease, or unspecified chronic kidney disease: Secondary | ICD-10-CM | POA: Diagnosis not present

## 2023-10-14 DIAGNOSIS — D649 Anemia, unspecified: Secondary | ICD-10-CM | POA: Diagnosis not present

## 2023-10-14 DIAGNOSIS — M25511 Pain in right shoulder: Secondary | ICD-10-CM | POA: Diagnosis not present

## 2023-10-14 DIAGNOSIS — E7800 Pure hypercholesterolemia, unspecified: Secondary | ICD-10-CM | POA: Diagnosis not present

## 2023-10-14 DIAGNOSIS — N1832 Chronic kidney disease, stage 3b: Secondary | ICD-10-CM | POA: Diagnosis not present

## 2023-10-14 DIAGNOSIS — R600 Localized edema: Secondary | ICD-10-CM | POA: Diagnosis not present

## 2023-10-14 DIAGNOSIS — Z7901 Long term (current) use of anticoagulants: Secondary | ICD-10-CM | POA: Diagnosis not present

## 2023-10-14 DIAGNOSIS — I480 Paroxysmal atrial fibrillation: Secondary | ICD-10-CM | POA: Diagnosis not present

## 2023-10-14 DIAGNOSIS — I509 Heart failure, unspecified: Secondary | ICD-10-CM | POA: Diagnosis not present

## 2023-10-14 DIAGNOSIS — R519 Headache, unspecified: Secondary | ICD-10-CM | POA: Diagnosis not present

## 2023-10-14 DIAGNOSIS — E119 Type 2 diabetes mellitus without complications: Secondary | ICD-10-CM | POA: Diagnosis not present

## 2023-10-14 DIAGNOSIS — M542 Cervicalgia: Secondary | ICD-10-CM | POA: Diagnosis not present

## 2023-10-14 DIAGNOSIS — N189 Chronic kidney disease, unspecified: Secondary | ICD-10-CM | POA: Diagnosis not present

## 2023-10-14 DIAGNOSIS — I5032 Chronic diastolic (congestive) heart failure: Secondary | ICD-10-CM | POA: Diagnosis not present

## 2023-10-14 MED ORDER — PRESERVISION AREDS 2+MULTI VIT PO CAPS: 1.0000 | ORAL_CAPSULE | Freq: Every day | ORAL | 0 refills | Status: AC

## 2023-10-14 MED ORDER — PANTOPRAZOLE SODIUM 40 MG PO TBEC: 40.0000 mg | DELAYED_RELEASE_TABLET | Freq: Every day | ORAL | 0 refills | Status: AC

## 2023-10-14 MED ORDER — DILTIAZEM HCL ER COATED BEADS 120 MG PO CP24: 120.0000 mg | ORAL_CAPSULE | Freq: Every day | ORAL | 0 refills | Status: AC

## 2023-10-14 MED ORDER — PREGABALIN 150 MG PO CAPS: 150.0000 mg | ORAL_CAPSULE | Freq: Two times a day (BID) | ORAL | 0 refills | Status: AC

## 2023-10-14 MED ORDER — ENTRESTO 24-26 MG PO TABS: 1.0000 | ORAL_TABLET | Freq: Two times a day (BID) | ORAL | 2 refills | Status: DC

## 2023-10-14 MED ORDER — FERROUS SULFATE 325 (65 FE) MG PO TABS: 325.0000 mg | ORAL_TABLET | Freq: Every day | ORAL | 0 refills | Status: AC

## 2023-10-14 MED ORDER — BUMETANIDE 0.5 MG PO TABS
0.5000 mg | ORAL_TABLET | Freq: Every morning | ORAL | 1 refills | Status: DC
Start: 2023-10-14 — End: 2023-12-30

## 2023-10-14 MED ORDER — QUETIAPINE FUMARATE 25 MG PO TABS
50.0000 mg | ORAL_TABLET | Freq: Every day | ORAL | Status: DC
  Administered 2023-10-14: 50 mg via ORAL
  Filled 2023-10-14: qty 2

## 2023-10-14 MED ORDER — DAPAGLIFLOZIN PROPANEDIOL 5 MG PO TABS: 10.0000 mg | ORAL_TABLET | Freq: Every day | ORAL | 0 refills | Status: DC

## 2023-10-14 MED ORDER — POLYETHYLENE GLYCOL 3350 17 G PO PACK: 17.0000 g | PACK | Freq: Every day | ORAL | 0 refills | Status: AC

## 2023-10-14 MED ORDER — DULOXETINE HCL 30 MG PO CPEP: 30.0000 mg | ORAL_CAPSULE | Freq: Every day | ORAL | 0 refills | Status: AC

## 2023-10-14 MED ORDER — OLANZAPINE 2.5 MG PO TABS: 2.5000 mg | ORAL_TABLET | Freq: Every day | ORAL | 0 refills | Status: AC

## 2023-10-14 MED ORDER — LATANOPROST 0.005 % OP SOLN: 1.0000 [drp] | Freq: Every day | OPHTHALMIC | 0 refills | Status: AC

## 2023-10-14 NOTE — Evaluation (Signed)
Physical Therapy Evaluation Patient Details Name: Shane Boulware Sr. MRN: [id number] DOB: 08/31/1925 Today's Date: 10/14/2023  History of Present Illness  Patient is a 89 year old male presenting after fall at assisted living. Imaging did not reveal acute fractures, although patient difficulty with ambulation in the ED.  Clinical Impression  Patient is cooperative throughout session and able to follow single step commands consistently with increased time. Patient lives at ALF and is Mod I with ambulation using a rolling walker. PRN assistance for bathing/dressing and assistance provided for meals and medication management at the facility.  Today, the patient is requiring physical assistance with bed mobility, transfers, and ambulation. No pain is reported with activity. Three bouts of short distance ambulation with rolling walker, Min A required for steadying with more pronounced posterior lean with taking steps backwards. He was fatigued with activity with mild dyspnea with exertion.  The patient is not at his baseline level of functional independence. Consider rehabilitation <3 hours/day after this hospital stay. Daughter is hopeful for short term rehab if possible.       If plan is discharge home, recommend the following: A lot of help with walking and/or transfers;A lot of help with bathing/dressing/bathroom;Assist for transportation;Help with stairs or ramp for entrance;Assistance with cooking/housework;Supervision due to cognitive status   Can travel by private vehicle   Yes    Equipment Recommendations None recommended by PT  Recommendations for Other Services       Functional Status Assessment Patient has had a recent decline in their functional status and demonstrates the ability to make significant improvements in function in a reasonable and predictable amount of time.     Precautions / Restrictions Precautions Precautions: Fall Restrictions Weight Bearing Restrictions:  No      Mobility  Bed Mobility Overal bed mobility: Needs Assistance Bed Mobility: Supine to Sit, Sit to Supine     Supine to sit: Min assist, HOB elevated Sit to supine: Mod assist (HOB flat)   General bed mobility comments: increased time required with cues for technique    Transfers Overall transfer level: Needs assistance Equipment used: Rolling walker (2 wheels) Transfers: Sit to/from Stand Sit to Stand: Contact guard assist           General transfer comment: 3 standing bouts performed. cues for safety    Ambulation/Gait Ambulation/Gait assistance: Min assist Gait Distance (Feet): 10 Feet (x 3 bouts) Assistive device: Rolling walker (2 wheels) Gait Pattern/deviations: Decreased stride length, Narrow base of support, Leaning posteriorly Gait velocity: decreased     General Gait Details: patient walked 3 bouts, short distance ambulation. steadying assistance required with walking with posterior lean more pronounced when taking steps backwards. mild dyspnea with exertion and patient is fatigued with activity. no pain is reported  Acupuncturist Bed    Modified Rankin (Stroke Patients Only)       Balance Overall balance assessment: Needs assistance Sitting-balance support: Feet supported Sitting balance-Leahy Scale: Fair   Postural control: Posterior lean Standing balance support: Bilateral upper extremity supported, During functional activity, Reliant on assistive device for balance Standing balance-Leahy Scale: Poor Standing balance comment: external support required from rolling walker and from therapist. posterior lean more pronounced with dynamic activity                             Pertinent Vitals/Pain Pain  Assessment Pain Assessment: No/denies pain    Home Living Family/patient expects to be discharged to:: Assisted living                 Home Equipment: Rolling Walker (2  wheels);Wheelchair - manual Additional Comments: just started home health SLP and PT    Prior Function Prior Level of Function : Needs assist             Mobility Comments: Mod I with rolling walker, can ambulate in the room and to the dining room. He often walks around the room without rolling walker, holding on to furniture ADLs Comments: assist with meals and medication management. patient can have assistance with bathing and dressing but often will do these tasks without assistance     Extremity/Trunk Assessment   Upper Extremity Assessment Upper Extremity Assessment: Generalized weakness    Lower Extremity Assessment Lower Extremity Assessment: Generalized weakness       Communication   Communication Communication: No apparent difficulties  Cognition Arousal: Alert Behavior During Therapy: WFL for tasks assessed/performed Overall Cognitive Status: History of cognitive impairments - at baseline                                 General Comments: Patient is cooperative and able to follow single step commands consistently with increased time. He is oriented to person        General Comments General comments (skin integrity, edema, etc.): supportive daughter was present throughout session    Exercises     Assessment/Plan    PT Assessment Patient needs continued PT services  PT Problem List Decreased strength;Decreased activity tolerance;Decreased balance;Decreased mobility;Decreased cognition;Decreased knowledge of use of DME;Decreased safety awareness       PT Treatment Interventions DME instruction;Gait training;Stair training;Functional mobility training;Therapeutic activities;Therapeutic exercise;Balance training;Neuromuscular re-education;Cognitive remediation;Patient/family education    PT Goals (Current goals can be found in the Care Plan section)  Acute Rehab PT Goals Patient Stated Goal: short term rehab and then discharge back to ALF  (family goal) PT Goal Formulation: With family Time For Goal Achievement: 10/21/23 Potential to Achieve Goals: Good    Frequency Min 1X/week     Co-evaluation               AM-PAC PT "6 Clicks" Mobility  Outcome Measure Help needed turning from your back to your side while in a flat bed without using bedrails?: A Little Help needed moving from lying on your back to sitting on the side of a flat bed without using bedrails?: A Lot Help needed moving to and from a bed to a chair (including a wheelchair)?: A Little Help needed standing up from a chair using your arms (e.g., wheelchair or bedside chair)?: A Little Help needed to walk in hospital room?: A Little Help needed climbing 3-5 steps with a railing? : Total 6 Click Score: 15    End of Session Equipment Utilized During Treatment: Gait belt Activity Tolerance: Patient limited by fatigue;Patient tolerated treatment well Patient left: in bed;with call bell/phone within reach;with bed alarm set;with nursing/sitter in room;with family/visitor present Nurse Communication: Mobility status PT Visit Diagnosis: Unsteadiness on feet (R26.81);Muscle weakness (generalized) (M62.81)    Time: 1610-9604 PT Time Calculation (min) (ACUTE ONLY): 27 min   Charges:   PT Evaluation $PT Eval Low Complexity: 1 Low   PT General Charges $$ ACUTE PT VISIT: 1 Visit        [place] Ana  Roxan Hockey, PT, MPT   Ina Homes 10/14/2023, 9:29 AM

## 2023-10-14 NOTE — ED Notes (Signed)
Pt removed condom catheter x 2 and is refusing to be touched to apply a new one. Urinal offered and pt stated he can't use it. Pt continues to threaten that he is getting up and leaving. Pt is being educated that he is a fall risk and we are making sure he does not injure himself

## 2023-10-14 NOTE — ED Notes (Addendum)
Pt removed O2 pulse ox and refuses to put it on again and is currently trying to remove fall risk bracelet even after being educated to leave these items. Pt is on the posie alarm system and is on.

## 2023-10-14 NOTE — ED Provider Notes (Signed)
Emergency Medicine Observation Re-evaluation Note  Shane Humbard Sr. is a 89 y.o. male, seen on rounds today.  Pt initially presented to the ED for complaints of   Physical Exam  BP (!) 127/90 (BP Location: Right Arm)   Pulse 80   Temp (!) 97.5 F (36.4 C) (Oral)   Resp 18   SpO2 97%  Physical Exam General: NO acute distress Cardiac: well-perfused Lungs: resp distress Psych: calm, cooperative  ED Course / MDM  EKG:   I have reviewed the labs performed to date as well as medications administered while in observation.  Recent changes in the last 24 hours include Accepted by his facility to SNF side. They are requesting medication prescriptions be sent as printed prescriptions to facility.  Plan  Current plan is for placement. DC w/ discharge instructions/return precautions. Advised to f/u with PCP about meds.    Loetta Rough, MD 10/14/23 [phone]

## 2023-10-14 NOTE — ED Notes (Signed)
Pt has attempted to get out of the bed multiple times, redirected by myself and the NT. Pt redirected easily. Pt states multiples times he is looking for his glasses. This RN searched the pts bed, belongings, linen and looked in the floor of the room to assure the pt his glasses were not in his room. This RN informed the pt that his daughter may have taken them home with her or he might not have worn them to the hospital at all.

## 2023-10-14 NOTE — ED Notes (Signed)
Pt soiled bed. Gown and linen changed. Urinal still at bedside.

## 2023-10-14 NOTE — Progress Notes (Signed)
CSW received a call from [facility] in admissions who is willing to give patient a chance. CSW started the insurance authorization in [place] [id number]. Sheree stated she can take patient over the weekend if approved.

## 2023-10-14 NOTE — [place] FL2 (Signed)
[place] MEDICAID FL2 LEVEL OF CARE FORM     IDENTIFICATION  Patient Name: Shane Heft Sr. Birthdate: [birth year] Sex: male Admission Date (Current Location): 10/13/2023  [facility] and [place] Number:  Producer, television/film/video and Address:  [facility] [place], [address] [place], [place] [address]      Provider Number: [id number]  Attending Physician Name and Address:  No att. providers found  Relative Name and Phone Number:  Rudolpho Sevin (Daughter)  [phone]    Current Level of Care: Hospital Recommended Level of Care: Skilled Nursing Facility Prior Approval Number:    Date Approved/Denied:   PASRR Number: [id number]  Discharge Plan: SNF    Current Diagnoses: Patient Active Problem List   Diagnosis Date Noted   PVC (premature ventricular contraction)    Frequent PVCs 01/29/2019   CKD (chronic kidney disease) stage 3, GFR 30-59 ml/min (HCC) 01/29/2019   Benign hypertension with CKD (chronic kidney disease) stage III 01/29/2019   Abdominal aneurysm (HCC) 01/29/2019    Orientation RESPIRATION BLADDER Height & Weight     Self  Normal Continent Weight: 180 lbs  Height:  5'8   BEHAVIORAL SYMPTOMS/MOOD NEUROLOGICAL BOWEL NUTRITION STATUS      Continent Diet (Regular)  AMBULATORY STATUS COMMUNICATION OF NEEDS Skin   Limited Assist Verbally Normal                       Personal Care Assistance Level of Assistance  Bathing, Feeding, Dressing Bathing Assistance: Limited assistance Feeding assistance: Independent Dressing Assistance: Limited assistance     Functional Limitations Info  Sight, Hearing, Speech Sight Info: Impaired (Wears glasses) Hearing Info: Adequate Speech Info: Adequate    SPECIAL CARE FACTORS FREQUENCY                       Contractures Contractures Info: Not present    Additional Factors Info  Code Status, Allergies Code Status Info: Not on file Allergies Info: Amlodipine, Antihistamines,  Loratadine-type , Tape           Current Medications (10/14/2023):  This is the current hospital active medication list Current Facility-Administered Medications  Medication Dose Route Frequency Provider Last Rate Last Admin   QUEtiapine (SEROQUEL) tablet 50 mg  50 mg Oral QHS Kommor, [place], MD   50 mg at 10/14/23 0151   Current Outpatient Medications  Medication Sig Dispense Refill   acetaminophen (TYLENOL) 325 MG tablet Take 650 mg by mouth every 6 (six) hours as needed.     bumetanide (BUMEX) 0.5 MG tablet TAKE 1 TABLET BY MOUTH EVERY MORNING. (Patient taking differently: Take 0.5 mg by mouth as needed (Use if weight is >178lbs).) 90 tablet 1   dapagliflozin propanediol (FARXIGA) 10 MG TABS tablet Take 10 mg by mouth daily.     diltiazem (CARDIZEM CD) 120 MG 24 hr capsule TAKE 1 CAPSULE BY MOUTH EVERY DAY 90 capsule 0   DULoxetine (CYMBALTA) 30 MG capsule Take 30 mg by mouth daily.     ENTRESTO 24-26 MG Take 1 tablet by mouth 2 (two) times daily.     ferrous sulfate 325 (65 FE) MG tablet Take 325 mg by mouth daily with breakfast.     latanoprost (XALATAN) 0.005 % ophthalmic solution INSTILL 1 DROP INTO BOTH EYES EVERY DAY AT NIGHT     Multiple Vitamins-Minerals (PRESERVISION AREDS 2+MULTI VIT PO) Take 2 capsules by mouth daily.     OLANZapine (ZYPREXA) 2.5 MG tablet  Take 2.5 mg by mouth daily.     pantoprazole (PROTONIX) 40 MG tablet Take 40 mg by mouth daily.     polyethylene glycol (MIRALAX / GLYCOLAX) 17 g packet Take 17 g by mouth daily. Mix 17g with 8oz of fluid of choice     pregabalin (LYRICA) 150 MG capsule Take 150 mg by mouth 2 (two) times daily.       Discharge Medications: Please see discharge summary for a list of discharge medications.  Relevant Imaging Results:  Relevant Lab Results:   Additional Information SSN: [id number]  Susa Simmonds, LCSWA

## 2023-10-14 NOTE — Discharge Instructions (Addendum)
Thank you for coming [facility]. You were seen for fall. We did an exam, labs, and imaging, and these showed no acute findings. Please follow up with your primary care provider within 1 week. Please talk with your doctor about your medications and whether they may be contributing to your fall.  Your medications were provided as printed prescriptions per the request of the nursing facility.   Do not hesitate to return to the ED or call 911 if you experience: -Worsening symptoms -Lightheadedness, passing out -Fevers/chills -Anything else that concerns you

## 2023-10-14 NOTE — ED Notes (Signed)
Patient awake and alert this morning, no s/s of distress, sitter present at bedside, will continue to monitor.

## 2023-10-14 NOTE — TOC Initial Note (Signed)
Transition of Care (TOC) - Initial/Assessment Note    Patient Details  Name: Shane Matzke Sr. MRN: [id number] Date of Birth: 1925/07/12  Transition of Care [facility]) CM/SW Contact:    Susa Simmonds, LCSWA Phone Number: 10/14/2023, 10:23 AM  Clinical Narrative:  CSW spoke with patients daughter and brother at bedside. CSW explained that patient will need to go at least 24 hrs without a sitter to transfer to a skilled nursing facility. Patient currently resides at [facility] on the assisted living side. Patients family would like patient to transition to the skilled side at the facility. Patients family also stated they would consider [facility] as well. SNF bed search has been started.    CSW spoke with [facility] with admissions at [facility] who stated she would review patient.               Expected Discharge Plan: Skilled Nursing Facility Barriers to Discharge: Continued Medical Work up   Patient Goals and CMS Choice Patient states their goals for this hospitalization and ongoing recovery are:: SNF or assisted living          Expected Discharge Plan and Services       Living arrangements for the past 2 months: Assisted Living Facility                                      Prior Living Arrangements/Services Living arrangements for the past 2 months: Assisted Living Facility Lives with:: Self Patient language and need for interpreter reviewed:: Yes Do you feel safe going back to the place where you live?: Yes      Need for Family Participation in Patient Care: Yes (Comment) Care giver support system in place?: Yes (comment)   Criminal Activity/Legal Involvement Pertinent to Current Situation/Hospitalization: No - Comment as needed  Activities of Daily Living      Permission Sought/Granted Permission sought to share information with : Family Supports                Emotional Assessment Appearance:: Appears stated  age Attitude/Demeanor/Rapport: Engaged Affect (typically observed): Accepting Orientation: : Oriented to Self Alcohol / Substance Use: Not Applicable Psych Involvement: No (comment)  Admission diagnosis:  SNF, Fall Patient Active Problem List   Diagnosis Date Noted   PVC (premature ventricular contraction)    Frequent PVCs 01/29/2019   CKD (chronic kidney disease) stage 3, GFR 30-59 ml/min (HCC) 01/29/2019   Benign hypertension with CKD (chronic kidney disease) stage III 01/29/2019   Abdominal aneurysm (HCC) 01/29/2019   PCP:  Marcelyn Bruins, NP Pharmacy:   [phone] [place] [place] - [address] [place] [address] Phone: [phone] Fax: [phone]     Social Determinants of Health (SDOH) Social History: SDOH Screenings   Food Insecurity: No Food Insecurity (06/08/2023)   Received from [facility]  Housing: Low Risk  (09/15/2022)  Transportation Needs: No Transportation Needs (07/13/2023)   Received from [facility]  Utilities: Not At Risk (06/08/2023)   Received from [facility]  Depression (PHQ2-9): Low Risk  (09/15/2022)  Social Connections: Unknown (07/03/2022)   Received from [facility]  Stress: No Stress Concern Present (06/08/2023)   Received from [facility]  Tobacco Use: Medium Risk (08/04/2023)   Received from [facility]   SDOH Interventions:     Readmission Risk Interventions  No data to display

## 2023-10-17 DIAGNOSIS — I480 Paroxysmal atrial fibrillation: Secondary | ICD-10-CM | POA: Diagnosis not present

## 2023-10-17 DIAGNOSIS — G629 Polyneuropathy, unspecified: Secondary | ICD-10-CM | POA: Diagnosis not present

## 2023-10-17 DIAGNOSIS — T7840XA Allergy, unspecified, initial encounter: Secondary | ICD-10-CM | POA: Diagnosis not present

## 2023-10-17 DIAGNOSIS — I130 Hypertensive heart and chronic kidney disease with heart failure and stage 1 through stage 4 chronic kidney disease, or unspecified chronic kidney disease: Secondary | ICD-10-CM | POA: Diagnosis not present

## 2023-10-17 DIAGNOSIS — I5032 Chronic diastolic (congestive) heart failure: Secondary | ICD-10-CM | POA: Diagnosis not present

## 2023-10-17 DIAGNOSIS — N1832 Chronic kidney disease, stage 3b: Secondary | ICD-10-CM | POA: Diagnosis not present

## 2023-10-18 DIAGNOSIS — E7800 Pure hypercholesterolemia, unspecified: Secondary | ICD-10-CM | POA: Diagnosis not present

## 2023-10-18 DIAGNOSIS — D649 Anemia, unspecified: Secondary | ICD-10-CM | POA: Diagnosis not present

## 2023-10-21 ENCOUNTER — Ambulatory Visit: Payer: Self-pay | Admitting: Cardiology

## 2023-10-24 ENCOUNTER — Ambulatory Visit: Payer: Medicare Other | Admitting: Cardiology

## 2023-10-25 DIAGNOSIS — E119 Type 2 diabetes mellitus without complications: Secondary | ICD-10-CM | POA: Diagnosis not present

## 2023-10-25 DIAGNOSIS — E7800 Pure hypercholesterolemia, unspecified: Secondary | ICD-10-CM | POA: Diagnosis not present

## 2023-10-26 ENCOUNTER — Ambulatory Visit: Payer: Medicare Other | Admitting: Cardiology

## 2023-10-27 DIAGNOSIS — R2689 Other abnormalities of gait and mobility: Secondary | ICD-10-CM | POA: Diagnosis not present

## 2023-10-27 DIAGNOSIS — R2681 Unsteadiness on feet: Secondary | ICD-10-CM | POA: Diagnosis not present

## 2023-10-27 DIAGNOSIS — M6281 Muscle weakness (generalized): Secondary | ICD-10-CM | POA: Diagnosis not present

## 2023-10-27 DIAGNOSIS — I4891 Unspecified atrial fibrillation: Secondary | ICD-10-CM | POA: Diagnosis not present

## 2023-10-27 DIAGNOSIS — M1990 Unspecified osteoarthritis, unspecified site: Secondary | ICD-10-CM | POA: Diagnosis not present

## 2023-11-01 DIAGNOSIS — M1990 Unspecified osteoarthritis, unspecified site: Secondary | ICD-10-CM | POA: Diagnosis not present

## 2023-11-01 DIAGNOSIS — G4700 Insomnia, unspecified: Secondary | ICD-10-CM | POA: Diagnosis not present

## 2023-11-01 DIAGNOSIS — G629 Polyneuropathy, unspecified: Secondary | ICD-10-CM | POA: Diagnosis not present

## 2023-11-01 DIAGNOSIS — I5032 Chronic diastolic (congestive) heart failure: Secondary | ICD-10-CM | POA: Diagnosis not present

## 2023-11-01 DIAGNOSIS — Z7984 Long term (current) use of oral hypoglycemic drugs: Secondary | ICD-10-CM | POA: Diagnosis not present

## 2023-11-01 DIAGNOSIS — R279 Unspecified lack of coordination: Secondary | ICD-10-CM | POA: Diagnosis not present

## 2023-11-01 DIAGNOSIS — Z741 Need for assistance with personal care: Secondary | ICD-10-CM | POA: Diagnosis not present

## 2023-11-01 DIAGNOSIS — R296 Repeated falls: Secondary | ICD-10-CM | POA: Diagnosis not present

## 2023-11-01 DIAGNOSIS — N1832 Chronic kidney disease, stage 3b: Secondary | ICD-10-CM | POA: Diagnosis not present

## 2023-11-01 DIAGNOSIS — G8929 Other chronic pain: Secondary | ICD-10-CM | POA: Diagnosis not present

## 2023-11-01 DIAGNOSIS — I4891 Unspecified atrial fibrillation: Secondary | ICD-10-CM | POA: Diagnosis not present

## 2023-11-01 DIAGNOSIS — I480 Paroxysmal atrial fibrillation: Secondary | ICD-10-CM | POA: Diagnosis not present

## 2023-11-02 DIAGNOSIS — R1310 Dysphagia, unspecified: Secondary | ICD-10-CM | POA: Diagnosis not present

## 2023-11-03 DIAGNOSIS — E785 Hyperlipidemia, unspecified: Secondary | ICD-10-CM | POA: Diagnosis not present

## 2023-11-07 DIAGNOSIS — I4891 Unspecified atrial fibrillation: Secondary | ICD-10-CM | POA: Diagnosis not present

## 2023-11-07 DIAGNOSIS — M6281 Muscle weakness (generalized): Secondary | ICD-10-CM | POA: Diagnosis not present

## 2023-11-07 DIAGNOSIS — R2689 Other abnormalities of gait and mobility: Secondary | ICD-10-CM | POA: Diagnosis not present

## 2023-11-07 DIAGNOSIS — M1990 Unspecified osteoarthritis, unspecified site: Secondary | ICD-10-CM | POA: Diagnosis not present

## 2023-11-07 DIAGNOSIS — R2681 Unsteadiness on feet: Secondary | ICD-10-CM | POA: Diagnosis not present

## 2023-11-08 DIAGNOSIS — M1990 Unspecified osteoarthritis, unspecified site: Secondary | ICD-10-CM | POA: Diagnosis not present

## 2023-11-08 DIAGNOSIS — I4891 Unspecified atrial fibrillation: Secondary | ICD-10-CM | POA: Diagnosis not present

## 2023-11-08 DIAGNOSIS — Z741 Need for assistance with personal care: Secondary | ICD-10-CM | POA: Diagnosis not present

## 2023-11-08 DIAGNOSIS — R279 Unspecified lack of coordination: Secondary | ICD-10-CM | POA: Diagnosis not present

## 2023-11-08 DIAGNOSIS — R1310 Dysphagia, unspecified: Secondary | ICD-10-CM | POA: Diagnosis not present

## 2023-11-08 DIAGNOSIS — R296 Repeated falls: Secondary | ICD-10-CM | POA: Diagnosis not present

## 2023-11-09 DIAGNOSIS — R1310 Dysphagia, unspecified: Secondary | ICD-10-CM | POA: Diagnosis not present

## 2023-11-09 DIAGNOSIS — M1990 Unspecified osteoarthritis, unspecified site: Secondary | ICD-10-CM | POA: Diagnosis not present

## 2023-11-09 DIAGNOSIS — M6281 Muscle weakness (generalized): Secondary | ICD-10-CM | POA: Diagnosis not present

## 2023-11-09 DIAGNOSIS — R2689 Other abnormalities of gait and mobility: Secondary | ICD-10-CM | POA: Diagnosis not present

## 2023-11-09 DIAGNOSIS — R2681 Unsteadiness on feet: Secondary | ICD-10-CM | POA: Diagnosis not present

## 2023-11-10 DIAGNOSIS — M1990 Unspecified osteoarthritis, unspecified site: Secondary | ICD-10-CM | POA: Diagnosis not present

## 2023-11-10 DIAGNOSIS — Z741 Need for assistance with personal care: Secondary | ICD-10-CM | POA: Diagnosis not present

## 2023-11-10 DIAGNOSIS — R296 Repeated falls: Secondary | ICD-10-CM | POA: Diagnosis not present

## 2023-11-10 DIAGNOSIS — R279 Unspecified lack of coordination: Secondary | ICD-10-CM | POA: Diagnosis not present

## 2023-11-11 DIAGNOSIS — R2689 Other abnormalities of gait and mobility: Secondary | ICD-10-CM | POA: Diagnosis not present

## 2023-11-11 DIAGNOSIS — R2681 Unsteadiness on feet: Secondary | ICD-10-CM | POA: Diagnosis not present

## 2023-11-11 DIAGNOSIS — R296 Repeated falls: Secondary | ICD-10-CM | POA: Diagnosis not present

## 2023-11-11 DIAGNOSIS — Z741 Need for assistance with personal care: Secondary | ICD-10-CM | POA: Diagnosis not present

## 2023-11-11 DIAGNOSIS — M1990 Unspecified osteoarthritis, unspecified site: Secondary | ICD-10-CM | POA: Diagnosis not present

## 2023-11-11 DIAGNOSIS — R279 Unspecified lack of coordination: Secondary | ICD-10-CM | POA: Diagnosis not present

## 2023-11-11 DIAGNOSIS — M6281 Muscle weakness (generalized): Secondary | ICD-10-CM | POA: Diagnosis not present

## 2023-11-14 DIAGNOSIS — M6281 Muscle weakness (generalized): Secondary | ICD-10-CM | POA: Diagnosis not present

## 2023-11-14 DIAGNOSIS — R2689 Other abnormalities of gait and mobility: Secondary | ICD-10-CM | POA: Diagnosis not present

## 2023-11-14 DIAGNOSIS — R2681 Unsteadiness on feet: Secondary | ICD-10-CM | POA: Diagnosis not present

## 2023-11-14 DIAGNOSIS — M1990 Unspecified osteoarthritis, unspecified site: Secondary | ICD-10-CM | POA: Diagnosis not present

## 2023-11-15 DIAGNOSIS — R2681 Unsteadiness on feet: Secondary | ICD-10-CM | POA: Diagnosis not present

## 2023-11-15 DIAGNOSIS — R279 Unspecified lack of coordination: Secondary | ICD-10-CM | POA: Diagnosis not present

## 2023-11-15 DIAGNOSIS — Z741 Need for assistance with personal care: Secondary | ICD-10-CM | POA: Diagnosis not present

## 2023-11-15 DIAGNOSIS — R1310 Dysphagia, unspecified: Secondary | ICD-10-CM | POA: Diagnosis not present

## 2023-11-15 DIAGNOSIS — R296 Repeated falls: Secondary | ICD-10-CM | POA: Diagnosis not present

## 2023-11-15 DIAGNOSIS — R2689 Other abnormalities of gait and mobility: Secondary | ICD-10-CM | POA: Diagnosis not present

## 2023-11-15 DIAGNOSIS — E860 Dehydration: Secondary | ICD-10-CM | POA: Diagnosis not present

## 2023-11-15 DIAGNOSIS — M1990 Unspecified osteoarthritis, unspecified site: Secondary | ICD-10-CM | POA: Diagnosis not present

## 2023-11-15 DIAGNOSIS — M6281 Muscle weakness (generalized): Secondary | ICD-10-CM | POA: Diagnosis not present

## 2023-11-16 DIAGNOSIS — R279 Unspecified lack of coordination: Secondary | ICD-10-CM | POA: Diagnosis not present

## 2023-11-16 DIAGNOSIS — M1990 Unspecified osteoarthritis, unspecified site: Secondary | ICD-10-CM | POA: Diagnosis not present

## 2023-11-16 DIAGNOSIS — Z741 Need for assistance with personal care: Secondary | ICD-10-CM | POA: Diagnosis not present

## 2023-11-16 DIAGNOSIS — R296 Repeated falls: Secondary | ICD-10-CM | POA: Diagnosis not present

## 2023-11-17 DIAGNOSIS — M6281 Muscle weakness (generalized): Secondary | ICD-10-CM | POA: Diagnosis not present

## 2023-11-17 DIAGNOSIS — R2689 Other abnormalities of gait and mobility: Secondary | ICD-10-CM | POA: Diagnosis not present

## 2023-11-17 DIAGNOSIS — M1990 Unspecified osteoarthritis, unspecified site: Secondary | ICD-10-CM | POA: Diagnosis not present

## 2023-11-17 DIAGNOSIS — I10 Essential (primary) hypertension: Secondary | ICD-10-CM | POA: Diagnosis not present

## 2023-11-17 DIAGNOSIS — R2681 Unsteadiness on feet: Secondary | ICD-10-CM | POA: Diagnosis not present

## 2023-11-18 DIAGNOSIS — M6281 Muscle weakness (generalized): Secondary | ICD-10-CM | POA: Diagnosis not present

## 2023-11-18 DIAGNOSIS — R2681 Unsteadiness on feet: Secondary | ICD-10-CM | POA: Diagnosis not present

## 2023-11-18 DIAGNOSIS — R1310 Dysphagia, unspecified: Secondary | ICD-10-CM | POA: Diagnosis not present

## 2023-11-18 DIAGNOSIS — R2689 Other abnormalities of gait and mobility: Secondary | ICD-10-CM | POA: Diagnosis not present

## 2023-11-18 DIAGNOSIS — M1990 Unspecified osteoarthritis, unspecified site: Secondary | ICD-10-CM | POA: Diagnosis not present

## 2023-11-20 DIAGNOSIS — R296 Repeated falls: Secondary | ICD-10-CM | POA: Diagnosis not present

## 2023-11-20 DIAGNOSIS — I4891 Unspecified atrial fibrillation: Secondary | ICD-10-CM | POA: Diagnosis not present

## 2023-11-20 DIAGNOSIS — M1990 Unspecified osteoarthritis, unspecified site: Secondary | ICD-10-CM | POA: Diagnosis not present

## 2023-11-20 DIAGNOSIS — R279 Unspecified lack of coordination: Secondary | ICD-10-CM | POA: Diagnosis not present

## 2023-11-20 DIAGNOSIS — R2689 Other abnormalities of gait and mobility: Secondary | ICD-10-CM | POA: Diagnosis not present

## 2023-11-20 DIAGNOSIS — R2681 Unsteadiness on feet: Secondary | ICD-10-CM | POA: Diagnosis not present

## 2023-11-20 DIAGNOSIS — Z741 Need for assistance with personal care: Secondary | ICD-10-CM | POA: Diagnosis not present

## 2023-11-20 DIAGNOSIS — M6281 Muscle weakness (generalized): Secondary | ICD-10-CM | POA: Diagnosis not present

## 2023-11-22 DIAGNOSIS — R1310 Dysphagia, unspecified: Secondary | ICD-10-CM | POA: Diagnosis not present

## 2023-11-24 DIAGNOSIS — R2681 Unsteadiness on feet: Secondary | ICD-10-CM | POA: Diagnosis not present

## 2023-11-24 DIAGNOSIS — M6281 Muscle weakness (generalized): Secondary | ICD-10-CM | POA: Diagnosis not present

## 2023-11-24 DIAGNOSIS — I4891 Unspecified atrial fibrillation: Secondary | ICD-10-CM | POA: Diagnosis not present

## 2023-11-24 DIAGNOSIS — R2689 Other abnormalities of gait and mobility: Secondary | ICD-10-CM | POA: Diagnosis not present

## 2023-11-24 DIAGNOSIS — Z741 Need for assistance with personal care: Secondary | ICD-10-CM | POA: Diagnosis not present

## 2023-11-24 DIAGNOSIS — M1990 Unspecified osteoarthritis, unspecified site: Secondary | ICD-10-CM | POA: Diagnosis not present

## 2023-11-24 DIAGNOSIS — R296 Repeated falls: Secondary | ICD-10-CM | POA: Diagnosis not present

## 2023-11-24 DIAGNOSIS — R279 Unspecified lack of coordination: Secondary | ICD-10-CM | POA: Diagnosis not present

## 2023-11-25 DIAGNOSIS — R1310 Dysphagia, unspecified: Secondary | ICD-10-CM | POA: Diagnosis not present

## 2023-11-26 DIAGNOSIS — R2681 Unsteadiness on feet: Secondary | ICD-10-CM | POA: Diagnosis not present

## 2023-11-26 DIAGNOSIS — Z741 Need for assistance with personal care: Secondary | ICD-10-CM | POA: Diagnosis not present

## 2023-11-26 DIAGNOSIS — R2689 Other abnormalities of gait and mobility: Secondary | ICD-10-CM | POA: Diagnosis not present

## 2023-11-26 DIAGNOSIS — R296 Repeated falls: Secondary | ICD-10-CM | POA: Diagnosis not present

## 2023-11-26 DIAGNOSIS — M6281 Muscle weakness (generalized): Secondary | ICD-10-CM | POA: Diagnosis not present

## 2023-11-26 DIAGNOSIS — M1990 Unspecified osteoarthritis, unspecified site: Secondary | ICD-10-CM | POA: Diagnosis not present

## 2023-11-26 DIAGNOSIS — R279 Unspecified lack of coordination: Secondary | ICD-10-CM | POA: Diagnosis not present

## 2023-11-27 DIAGNOSIS — R2681 Unsteadiness on feet: Secondary | ICD-10-CM | POA: Diagnosis not present

## 2023-11-27 DIAGNOSIS — R2689 Other abnormalities of gait and mobility: Secondary | ICD-10-CM | POA: Diagnosis not present

## 2023-11-27 DIAGNOSIS — M1990 Unspecified osteoarthritis, unspecified site: Secondary | ICD-10-CM | POA: Diagnosis not present

## 2023-11-27 DIAGNOSIS — M6281 Muscle weakness (generalized): Secondary | ICD-10-CM | POA: Diagnosis not present

## 2023-11-28 DIAGNOSIS — M1990 Unspecified osteoarthritis, unspecified site: Secondary | ICD-10-CM | POA: Diagnosis not present

## 2023-11-28 DIAGNOSIS — Z741 Need for assistance with personal care: Secondary | ICD-10-CM | POA: Diagnosis not present

## 2023-11-28 DIAGNOSIS — R279 Unspecified lack of coordination: Secondary | ICD-10-CM | POA: Diagnosis not present

## 2023-11-28 DIAGNOSIS — R296 Repeated falls: Secondary | ICD-10-CM | POA: Diagnosis not present

## 2023-11-29 DIAGNOSIS — R279 Unspecified lack of coordination: Secondary | ICD-10-CM | POA: Diagnosis not present

## 2023-11-29 DIAGNOSIS — R1310 Dysphagia, unspecified: Secondary | ICD-10-CM | POA: Diagnosis not present

## 2023-11-29 DIAGNOSIS — Z741 Need for assistance with personal care: Secondary | ICD-10-CM | POA: Diagnosis not present

## 2023-11-29 DIAGNOSIS — G8929 Other chronic pain: Secondary | ICD-10-CM | POA: Diagnosis not present

## 2023-11-29 DIAGNOSIS — M1990 Unspecified osteoarthritis, unspecified site: Secondary | ICD-10-CM | POA: Diagnosis not present

## 2023-11-29 DIAGNOSIS — R296 Repeated falls: Secondary | ICD-10-CM | POA: Diagnosis not present

## 2023-12-01 DIAGNOSIS — R279 Unspecified lack of coordination: Secondary | ICD-10-CM | POA: Diagnosis not present

## 2023-12-01 DIAGNOSIS — M1990 Unspecified osteoarthritis, unspecified site: Secondary | ICD-10-CM | POA: Diagnosis not present

## 2023-12-01 DIAGNOSIS — R296 Repeated falls: Secondary | ICD-10-CM | POA: Diagnosis not present

## 2023-12-01 DIAGNOSIS — R1310 Dysphagia, unspecified: Secondary | ICD-10-CM | POA: Diagnosis not present

## 2023-12-01 DIAGNOSIS — Z741 Need for assistance with personal care: Secondary | ICD-10-CM | POA: Diagnosis not present

## 2023-12-01 DIAGNOSIS — I4891 Unspecified atrial fibrillation: Secondary | ICD-10-CM | POA: Diagnosis not present

## 2023-12-03 DIAGNOSIS — R2689 Other abnormalities of gait and mobility: Secondary | ICD-10-CM | POA: Diagnosis not present

## 2023-12-03 DIAGNOSIS — R2681 Unsteadiness on feet: Secondary | ICD-10-CM | POA: Diagnosis not present

## 2023-12-03 DIAGNOSIS — M6281 Muscle weakness (generalized): Secondary | ICD-10-CM | POA: Diagnosis not present

## 2023-12-03 DIAGNOSIS — I4891 Unspecified atrial fibrillation: Secondary | ICD-10-CM | POA: Diagnosis not present

## 2023-12-03 DIAGNOSIS — M1990 Unspecified osteoarthritis, unspecified site: Secondary | ICD-10-CM | POA: Diagnosis not present

## 2023-12-05 DIAGNOSIS — R2681 Unsteadiness on feet: Secondary | ICD-10-CM | POA: Diagnosis not present

## 2023-12-05 DIAGNOSIS — R279 Unspecified lack of coordination: Secondary | ICD-10-CM | POA: Diagnosis not present

## 2023-12-05 DIAGNOSIS — M1990 Unspecified osteoarthritis, unspecified site: Secondary | ICD-10-CM | POA: Diagnosis not present

## 2023-12-05 DIAGNOSIS — R2689 Other abnormalities of gait and mobility: Secondary | ICD-10-CM | POA: Diagnosis not present

## 2023-12-05 DIAGNOSIS — M6281 Muscle weakness (generalized): Secondary | ICD-10-CM | POA: Diagnosis not present

## 2023-12-05 DIAGNOSIS — R296 Repeated falls: Secondary | ICD-10-CM | POA: Diagnosis not present

## 2023-12-05 DIAGNOSIS — I4891 Unspecified atrial fibrillation: Secondary | ICD-10-CM | POA: Diagnosis not present

## 2023-12-05 DIAGNOSIS — Z741 Need for assistance with personal care: Secondary | ICD-10-CM | POA: Diagnosis not present

## 2023-12-07 DIAGNOSIS — R1310 Dysphagia, unspecified: Secondary | ICD-10-CM | POA: Diagnosis not present

## 2023-12-13 DIAGNOSIS — W19XXXA Unspecified fall, initial encounter: Secondary | ICD-10-CM | POA: Diagnosis not present

## 2023-12-13 DIAGNOSIS — R262 Difficulty in walking, not elsewhere classified: Secondary | ICD-10-CM | POA: Diagnosis not present

## 2023-12-13 DIAGNOSIS — R1310 Dysphagia, unspecified: Secondary | ICD-10-CM | POA: Diagnosis not present

## 2023-12-15 DIAGNOSIS — D509 Iron deficiency anemia, unspecified: Secondary | ICD-10-CM | POA: Diagnosis not present

## 2023-12-15 DIAGNOSIS — R1310 Dysphagia, unspecified: Secondary | ICD-10-CM | POA: Diagnosis not present

## 2023-12-19 ENCOUNTER — Telehealth: Payer: Self-pay | Admitting: Cardiology

## 2023-12-19 DIAGNOSIS — I5032 Chronic diastolic (congestive) heart failure: Secondary | ICD-10-CM

## 2023-12-19 NOTE — Telephone Encounter (Signed)
Daughter is calling to get patient lab request sent over to [facility] care.  Shane Knight fax #[phone]. Please advise

## 2023-12-20 NOTE — Telephone Encounter (Signed)
Daughter stated patient's lab orders need to be faxed as soon as possible as the facility's nurse is only available this morning.  Lab orders need to be faxed to Attn: Sallyanne Havers. fax #[phone].  Daughter wants a call back to confirm orders have been sent.

## 2023-12-20 NOTE — Telephone Encounter (Signed)
Spoke with pt's daughter (per DPR on file) and she stated the pt is now in a memory care assisted living facility and wanted to know if there were any labs that needed to be completed prior to the pt's visit with Dr. Odis Hollingshead on 12/30/23. Per Dr. Odis Hollingshead, he would like for the pt to have a BMP and a Pro-BNP. The orders have been placed and released in the system as well as faxed to the assisted living facility the pt is at the request of the pt's daughter. LVM on daughter's phone to let her know the orders have been faxed to the facility.

## 2023-12-22 DIAGNOSIS — R1310 Dysphagia, unspecified: Secondary | ICD-10-CM | POA: Diagnosis not present

## 2023-12-22 DIAGNOSIS — I4891 Unspecified atrial fibrillation: Secondary | ICD-10-CM | POA: Diagnosis not present

## 2023-12-27 DIAGNOSIS — I5032 Chronic diastolic (congestive) heart failure: Secondary | ICD-10-CM | POA: Diagnosis not present

## 2023-12-27 DIAGNOSIS — G8929 Other chronic pain: Secondary | ICD-10-CM | POA: Diagnosis not present

## 2023-12-27 DIAGNOSIS — Z7984 Long term (current) use of oral hypoglycemic drugs: Secondary | ICD-10-CM | POA: Diagnosis not present

## 2023-12-27 DIAGNOSIS — R1310 Dysphagia, unspecified: Secondary | ICD-10-CM | POA: Diagnosis not present

## 2023-12-27 DIAGNOSIS — I480 Paroxysmal atrial fibrillation: Secondary | ICD-10-CM | POA: Diagnosis not present

## 2023-12-28 ENCOUNTER — Ambulatory Visit: Payer: Medicare Other | Admitting: Nurse Practitioner

## 2023-12-29 DIAGNOSIS — R1310 Dysphagia, unspecified: Secondary | ICD-10-CM | POA: Diagnosis not present

## 2023-12-30 ENCOUNTER — Ambulatory Visit: Payer: Medicare Other | Attending: Cardiology | Admitting: Cardiology

## 2023-12-30 VITALS — BP 96/60 | HR 79 | Resp 16 | Ht 68.0 in | Wt 168.0 lb

## 2023-12-30 DIAGNOSIS — Z87891 Personal history of nicotine dependence: Secondary | ICD-10-CM

## 2023-12-30 DIAGNOSIS — Z7901 Long term (current) use of anticoagulants: Secondary | ICD-10-CM

## 2023-12-30 DIAGNOSIS — I5031 Acute diastolic (congestive) heart failure: Secondary | ICD-10-CM

## 2023-12-30 DIAGNOSIS — I493 Ventricular premature depolarization: Secondary | ICD-10-CM

## 2023-12-30 DIAGNOSIS — I5032 Chronic diastolic (congestive) heart failure: Secondary | ICD-10-CM | POA: Diagnosis not present

## 2023-12-30 DIAGNOSIS — N183 Chronic kidney disease, stage 3 unspecified: Secondary | ICD-10-CM | POA: Diagnosis not present

## 2023-12-30 DIAGNOSIS — I480 Paroxysmal atrial fibrillation: Secondary | ICD-10-CM | POA: Diagnosis not present

## 2023-12-30 DIAGNOSIS — I129 Hypertensive chronic kidney disease with stage 1 through stage 4 chronic kidney disease, or unspecified chronic kidney disease: Secondary | ICD-10-CM | POA: Diagnosis not present

## 2023-12-30 MED ORDER — BUMETANIDE 0.5 MG PO TABS: 0.5000 mg | ORAL_TABLET | Freq: Every morning | ORAL | Status: AC

## 2023-12-30 MED ORDER — DAPAGLIFLOZIN PROPANEDIOL 5 MG PO TABS: 10.0000 mg | ORAL_TABLET | Freq: Every day | ORAL | Status: AC

## 2023-12-30 MED ORDER — ENTRESTO 24-26 MG PO TABS: 1.0000 | ORAL_TABLET | Freq: Two times a day (BID) | ORAL | Status: AC

## 2023-12-30 NOTE — Patient Instructions (Signed)
Medication Instructions:  Parameters place on Farxiga, Bumax, and Entresto to hold med if systolic blood pressure is less than 100.   *If you need a refill on your cardiac medications before your next appointment, please call your pharmacy*  Follow-Up: At [facility], you and your health needs are our priority.  As part of our continuing mission to provide you with exceptional heart care, we have created designated Provider Care Teams.  These Care Teams include your primary Cardiologist (physician) and Advanced Practice Providers (APPs -  Physician Assistants and Nurse Practitioners) who all work together to provide you with the care you need, when you need it.  Your next appointment:   1 year(s)  Provider:   Tessa Lerner, DO     Other Instructions   1st Floor: - Lobby - Registration  - Pharmacy  - Lab - Cafe  2nd Floor: - PV Lab - Diagnostic Testing (echo, CT, nuclear med)  3rd Floor: - Vacant  4th Floor: - TCTS (cardiothoracic surgery) - AFib Clinic - Structural Heart Clinic - Vascular Surgery  - Vascular Ultrasound  5th Floor: - HeartCare Cardiology (general and EP) - Clinical Pharmacy for coumadin, hypertension, lipid, weight-loss medications, and med management appointments    Valet parking services will be available as well.

## 2023-12-30 NOTE — Progress Notes (Unsigned)
Cardiology Office Note:  .   Date:  12/31/2023  ID:  Shane Jubilee Sr., DOB Aug 31, 1925, MRN [id number] PCP:  Marcelyn Bruins, NP  [facility] Providers Cardiologist:  Tessa Lerner, DO , [facility]  Electrophysiologist:  None  Click to update primary MD,subspecialty MD or APP then REFRESH:1}    Chief Complaint  Patient presents with   Acute heart failure with preserved ejection fraction    Follow-up    History of Present Illness: .   Shane Doss Sr. is a 89 y.o. Caucasian male whose past medical history and cardiovascular risk factors includes: Paroxysmal atrial fibrillation, HFpEF, hypertension with chronic kidney disease stage III, chronic venous insufficiency, PVCs, advanced age.   Patient is being followed by the practice for HFpEF and atrial fibrillation.  Patient is accompanied by his daughter Maralyn Sago and son at today's office visit.  Patient presents today for 88-month follow-up visit.  Over the last 6 months he has clinically declined in many ways.  Physically he used to ambulate with a cane and now predominantly gets transported in a wheelchair.  In July 2024 patient had a hospitalization at [facility] due to recurrent falls and was noted to be anemic requiring multiple blood transfusions and at that time Eliquis was discontinued.  It was felt that the risks outweigh the benefit.  In November 2024 he had another fall during which time he was admitted to [facility].  He was later transitioned to rehab and now he lives at an assisted living memory care facility.  Clinically he denies anginal chest pain or heart failure symptoms.  He still able to remain relatively euvolemic on the current heart failure medications.  Review of Systems: .   Review of Systems  Constitutional: Positive for malaise/fatigue.  Cardiovascular:  Negative for chest pain, claudication, irregular heartbeat, leg swelling, near-syncope, orthopnea, palpitations, paroxysmal nocturnal dyspnea  and syncope.  Respiratory:  Negative for shortness of breath.   Hematologic/Lymphatic: Negative for bleeding problem.  Musculoskeletal:  Positive for falls.    Studies Reviewed:   EKG: EKG Interpretation Date/Time:  Friday December 30 2023 09:20:35 EST Ventricular Rate:  79 PR Interval:  242 QRS Duration:  138 QT Interval:  400 QTC Calculation: 458 R Axis:   -48  Text Interpretation: Sinus rhythm with 1st degree A-V block with occasional Premature ventricular complexes and Premature atrial complexes Left axis deviation Right bundle branch block When compared with ECG of 10-Sep-2008 14:43, PR interval has increased Right bundle branch block is now Present Confirmed by Tessa Lerner [phone]) on 12/30/2023 9:34:52 AM  Echocardiogram: 07/04/2022 @ [facility]:  Left Ventricle: Systolic function is low normal. EF: 50-55%. Ejection fraction measured by 3D is 50%,    Left Ventricle: Doppler parameters consistent with mild diastolic dysfunction and low to normal LA pressure.    Right Atrium: Right atrium is mildly to moderately dilated.    Aortic Valve: There is no regurgitation or stenosis.    Mitral Valve: There is moderate regurgitation.    Tricuspid Valve: There is moderate regurgitation.    Tricuspid Valve: The right ventricular systolic pressure is moderately elevated (50-59 mmHg).  RADIOLOGY: NA  Risk Assessment/Calculations:   Click Here to Calculate/Change CHADS2VASc Score The patient's CHADS2-VASc score is 5, indicating a 7.2% annual risk of stroke.     Labs:       Latest Ref Rng & Units 10/13/2023   11:59 AM 01/05/2023   11:05 AM 01/27/2020   10:40 AM  CBC  WBC 4.0 - 10.5 K/uL 6.8   7.6   Hemoglobin 13.0 - 17.0 g/dL 28.4  13.2  44.0   Hematocrit 39.0 - 52.0 % 50.5  40.2  47.7   Platelets 150 - 400 K/uL 154   181        Latest Ref Rng & Units 10/13/2023   11:59 AM 03/25/2023   11:25 AM 02/21/2023    9:28 AM  BMP  Glucose 70 - 99 mg/dL 102  88  97   BUN 8 - 23  mg/dL 21  32  38   Creatinine 0.61 - 1.24 mg/dL 7.25  3.66  4.40   BUN/Creat Ratio 10 - 24  24  24    Sodium 135 - 145 mmol/L 142  143  145   Potassium 3.5 - 5.1 mmol/L 4.1  4.6  4.3   Chloride 98 - 111 mmol/L 108  103  102   CO2 22 - 32 mmol/L 27  27  27    Calcium 8.9 - 10.3 mg/dL 9.4  9.6  9.6       Latest Ref Rng & Units 10/13/2023   11:59 AM 03/25/2023   11:25 AM 02/21/2023    9:28 AM  CMP  Glucose 70 - 99 mg/dL 347  88  97   BUN 8 - 23 mg/dL 21  32  38   Creatinine 0.61 - 1.24 mg/dL 4.25  9.56  3.87   Sodium 135 - 145 mmol/L 142  143  145   Potassium 3.5 - 5.1 mmol/L 4.1  4.6  4.3   Chloride 98 - 111 mmol/L 108  103  102   CO2 22 - 32 mmol/L 27  27  27    Calcium 8.9 - 10.3 mg/dL 9.4  9.6  9.6     Lab Results  Component Value Date   CHOL 152 02/18/2020   HDL 53 02/18/2020   LDLCALC 82 02/18/2020   TRIG 90 02/18/2020   No results for input(s): "LIPOA" in the last 8760 hours. No components found for: "NTPROBNP" Recent Labs    01/21/23 1049 02/21/23 0928 03/25/23 1125  PROBNP 190 123 147   No results for input(s): "TSH" in the last 8760 hours.  External Labs: Collected: 09/06/2023 provided by patient's facility. BUN 23, creatinine 1.32. eGFR 49. Potassium 3.8. Magnesium 2.4. NT proBNP 137  Physical Exam:    Today's Vitals   12/30/23 0922  BP: 96/60  Pulse: 79  Resp: 16  SpO2: 95%  Weight: 168 lb (76.2 kg)  Height: 5\' 8"  (1.727 m)   Body mass index is 25.54 kg/m. Wt Readings from Last 3 Encounters:  12/30/23 168 lb (76.2 kg)  04/21/23 180 lb (81.6 kg)  03/25/23 184 lb 3.2 oz (83.6 kg)    Physical Exam  Constitutional: No distress.  Appears older than stated age, in a wheelchair, hemodynamically stable.   Neck: No JVD present.  Cardiovascular: Normal rate, regular rhythm, S1 normal, S2 normal, intact distal pulses and normal pulses. Exam reveals no gallop, no S3 and no S4.  Murmur heard. Holosystolic murmur is present with a grade of 3/6 at the  lower left sternal border. Pulmonary/Chest: Effort normal and breath sounds normal. No stridor. He has no wheezes. He has no rales.  Abdominal: Soft. Bowel sounds are normal. He exhibits no distension. There is no abdominal tenderness.  Musculoskeletal:        General: Edema (Trace bilateral) present.     Cervical back: Neck supple.  Neurological: He  is alert and oriented to person, place, and time.  Flat affect  Skin: Skin is warm and moist.     Impression & Recommendation(s):  Impression:   ICD-10-CM   1. Chronic heart failure with preserved ejection fraction (HFpEF) (HCC)  I50.32 EKG 12-Lead    bumetanide (BUMEX) 0.5 MG tablet    dapagliflozin propanediol (FARXIGA) 5 MG TABS tablet    ENTRESTO 24-26 MG    2. Paroxysmal A-fib (HCC)  I48.0     3. Benign hypertension with CKD (chronic kidney disease) stage III (HCC)  I12.9    N18.30     4. PVC (premature ventricular contraction)  I49.3     5. Former smoker  Z87.891        Recommendation(s):  Chronic heart failure with preserved ejection fraction (HFpEF) (HCC) Stage C, NYHA class II Clinically euvolemic. Currently on Bumex 0.5 mg p.o. every morning. Currently on Farxiga 10 mg p.o. daily. Currently on Entresto 24/26 mg p.o. twice daily Given his history of falls would like to place holding parameters on the above heart failure medications.   Patient is advised to hold these medications if SBP is <100 mmHg. Outside  labs from [facility] dated 12/22/2023 independently reviewed, BNP 2 Given his advanced age, clinical decline, family would like to hold off on up titration of medical therapy, agree. They would like to hold off on additional office visits for now unless change in clinical condition.  Paroxysmal A-fib (HCC) Rate control: Cardizem 120 mg p.o. daily Rhythm control: N/A. Thromboembolic prophylaxis: N/A In the past patient was on Eliquis and the medication was discontinued due to symptomatic anemia and  recurrent falls. Patient and family are aware that he is at risk of thromboembolic events given his history of A-fib and CHA2DS2-VASc score. Will continue to monitor clinically.  Benign hypertension with CKD (chronic kidney disease) stage III (HCC) Office blood pressures are soft. Continue same HFpEF medications with the exception of holding parameters going forward Low-salt diet  PVC (premature ventricular contraction) Continue diltiazem. EKG notes sinus rhythm with PVCs. Will hold off on up titration of GDMT due to soft blood pressures and history of falls. Continue to monitor clinically  Orders Placed:  Orders Placed This Encounter  Procedures   EKG 12-Lead    Final Medication List:    Meds ordered this encounter  Medications   bumetanide (BUMEX) 0.5 MG tablet    Sig: Take 1 tablet (0.5 mg total) by mouth every morning. Hold for SBP less than 100   dapagliflozin propanediol (FARXIGA) 5 MG TABS tablet    Sig: Take 2 tablets (10 mg total) by mouth daily. Hold for SBP less than 100   ENTRESTO 24-26 MG    Sig: Take 1 tablet by mouth 2 (two) times daily. Hold for SBP less than 100    Medications Discontinued During This Encounter  Medication Reason   ENTRESTO 24-26 MG    bumetanide (BUMEX) 0.5 MG tablet    dapagliflozin propanediol (FARXIGA) 5 MG TABS tablet      Current Outpatient Medications:    acetaminophen (TYLENOL) 325 MG tablet, Take 650 mg by mouth every 6 (six) hours as needed., Disp: , Rfl:    diltiazem (CARDIZEM CD) 120 MG 24 hr capsule, Take 1 capsule (120 mg total) by mouth daily., Disp: 90 capsule, Rfl: 0   DULoxetine (CYMBALTA) 30 MG capsule, Take 1 capsule (30 mg total) by mouth daily., Disp: 30 capsule, Rfl: 0   ferrous sulfate 325 (65 FE)  MG tablet, Take 1 tablet (325 mg total) by mouth daily with breakfast., Disp: 30 tablet, Rfl: 0   latanoprost (XALATAN) 0.005 % ophthalmic solution, 1 drop at bedtime., Disp: , Rfl:    loratadine (CLARITIN) 10 MG tablet,  Take 10 mg by mouth daily., Disp: , Rfl:    melatonin 5 MG TABS, Take 5 mg by mouth at bedtime., Disp: , Rfl:    Multiple Vitamins-Minerals (PRESERVISION AREDS) CAPS, Take 2 capsules by mouth daily., Disp: , Rfl:    OLANZapine (ZYPREXA) 2.5 MG tablet, Take 1 tablet (2.5 mg total) by mouth daily., Disp: 30 tablet, Rfl: 0   pantoprazole (PROTONIX) 40 MG tablet, Take 1 tablet (40 mg total) by mouth daily., Disp: 30 tablet, Rfl: 0   polyethylene glycol (MIRALAX / GLYCOLAX) 17 g packet, Take 17 g by mouth daily., Disp: , Rfl:    pregabalin (LYRICA) 150 MG capsule, Take 1 capsule (150 mg total) by mouth 2 (two) times daily., Disp: 60 capsule, Rfl: 0   bumetanide (BUMEX) 0.5 MG tablet, Take 1 tablet (0.5 mg total) by mouth every morning. Hold for SBP less than 100, Disp: , Rfl:    dapagliflozin propanediol (FARXIGA) 5 MG TABS tablet, Take 2 tablets (10 mg total) by mouth daily. Hold for SBP less than 100, Disp: , Rfl:    ENTRESTO 24-26 MG, Take 1 tablet by mouth 2 (two) times daily. Hold for SBP less than 100, Disp: , Rfl:   Consent:   NA  Disposition:   1 year follow-up sooner as needed.  His questions and concerns were addressed to his satisfaction. He voices understanding of the recommendations provided during this encounter.    Signed, Tessa Lerner, DO, [facility]  [facility]  [address] [place], [place] [address] 12/31/2023 5:18 PM

## 2023-12-31 ENCOUNTER — Encounter: Payer: Self-pay | Admitting: Cardiology

## 2024-01-03 DIAGNOSIS — I129 Hypertensive chronic kidney disease with stage 1 through stage 4 chronic kidney disease, or unspecified chronic kidney disease: Secondary | ICD-10-CM | POA: Diagnosis not present

## 2024-01-03 DIAGNOSIS — I480 Paroxysmal atrial fibrillation: Secondary | ICD-10-CM | POA: Diagnosis not present

## 2024-01-03 DIAGNOSIS — R1310 Dysphagia, unspecified: Secondary | ICD-10-CM | POA: Diagnosis not present

## 2024-01-03 DIAGNOSIS — N189 Chronic kidney disease, unspecified: Secondary | ICD-10-CM | POA: Diagnosis not present

## 2024-01-05 DIAGNOSIS — R1310 Dysphagia, unspecified: Secondary | ICD-10-CM | POA: Diagnosis not present

## 2024-01-10 DIAGNOSIS — R1310 Dysphagia, unspecified: Secondary | ICD-10-CM | POA: Diagnosis not present

## 2024-01-12 DIAGNOSIS — H353211 Exudative age-related macular degeneration, right eye, with active choroidal neovascularization: Secondary | ICD-10-CM | POA: Diagnosis not present

## 2024-01-12 DIAGNOSIS — H4051X1 Glaucoma secondary to other eye disorders, right eye, mild stage: Secondary | ICD-10-CM | POA: Diagnosis not present

## 2024-01-12 DIAGNOSIS — H353122 Nonexudative age-related macular degeneration, left eye, intermediate dry stage: Secondary | ICD-10-CM | POA: Diagnosis not present

## 2024-01-19 DIAGNOSIS — D509 Iron deficiency anemia, unspecified: Secondary | ICD-10-CM | POA: Diagnosis not present

## 2024-01-24 DIAGNOSIS — Z79899 Other long term (current) drug therapy: Secondary | ICD-10-CM | POA: Diagnosis not present

## 2024-02-07 DIAGNOSIS — Z9181 History of falling: Secondary | ICD-10-CM | POA: Diagnosis not present

## 2024-02-07 DIAGNOSIS — G8929 Other chronic pain: Secondary | ICD-10-CM | POA: Diagnosis not present

## 2024-02-10 DIAGNOSIS — M6281 Muscle weakness (generalized): Secondary | ICD-10-CM | POA: Diagnosis not present

## 2024-02-10 DIAGNOSIS — I4891 Unspecified atrial fibrillation: Secondary | ICD-10-CM | POA: Diagnosis not present

## 2024-02-10 DIAGNOSIS — M1990 Unspecified osteoarthritis, unspecified site: Secondary | ICD-10-CM | POA: Diagnosis not present

## 2024-02-13 DIAGNOSIS — I4891 Unspecified atrial fibrillation: Secondary | ICD-10-CM | POA: Diagnosis not present

## 2024-02-13 DIAGNOSIS — N183 Chronic kidney disease, stage 3 unspecified: Secondary | ICD-10-CM | POA: Diagnosis not present

## 2024-02-14 DIAGNOSIS — Z9181 History of falling: Secondary | ICD-10-CM | POA: Diagnosis not present

## 2024-02-14 DIAGNOSIS — R262 Difficulty in walking, not elsewhere classified: Secondary | ICD-10-CM | POA: Diagnosis not present

## 2024-02-15 DIAGNOSIS — M6281 Muscle weakness (generalized): Secondary | ICD-10-CM | POA: Diagnosis not present

## 2024-02-15 DIAGNOSIS — N183 Chronic kidney disease, stage 3 unspecified: Secondary | ICD-10-CM | POA: Diagnosis not present

## 2024-02-15 DIAGNOSIS — M1990 Unspecified osteoarthritis, unspecified site: Secondary | ICD-10-CM | POA: Diagnosis not present

## 2024-02-15 DIAGNOSIS — I4891 Unspecified atrial fibrillation: Secondary | ICD-10-CM | POA: Diagnosis not present

## 2024-02-16 DIAGNOSIS — R296 Repeated falls: Secondary | ICD-10-CM | POA: Diagnosis not present

## 2024-02-16 DIAGNOSIS — I4891 Unspecified atrial fibrillation: Secondary | ICD-10-CM | POA: Diagnosis not present

## 2024-02-16 DIAGNOSIS — D509 Iron deficiency anemia, unspecified: Secondary | ICD-10-CM | POA: Diagnosis not present

## 2024-02-16 DIAGNOSIS — N183 Chronic kidney disease, stage 3 unspecified: Secondary | ICD-10-CM | POA: Diagnosis not present

## 2024-02-16 DIAGNOSIS — Z741 Need for assistance with personal care: Secondary | ICD-10-CM | POA: Diagnosis not present

## 2024-02-16 DIAGNOSIS — M6281 Muscle weakness (generalized): Secondary | ICD-10-CM | POA: Diagnosis not present

## 2024-02-16 DIAGNOSIS — M1990 Unspecified osteoarthritis, unspecified site: Secondary | ICD-10-CM | POA: Diagnosis not present

## 2024-02-17 DIAGNOSIS — N183 Chronic kidney disease, stage 3 unspecified: Secondary | ICD-10-CM | POA: Diagnosis not present

## 2024-02-17 DIAGNOSIS — I4891 Unspecified atrial fibrillation: Secondary | ICD-10-CM | POA: Diagnosis not present

## 2024-02-20 DIAGNOSIS — N183 Chronic kidney disease, stage 3 unspecified: Secondary | ICD-10-CM | POA: Diagnosis not present

## 2024-02-20 DIAGNOSIS — I4891 Unspecified atrial fibrillation: Secondary | ICD-10-CM | POA: Diagnosis not present

## 2024-02-20 DIAGNOSIS — M6281 Muscle weakness (generalized): Secondary | ICD-10-CM | POA: Diagnosis not present

## 2024-02-20 DIAGNOSIS — M1990 Unspecified osteoarthritis, unspecified site: Secondary | ICD-10-CM | POA: Diagnosis not present

## 2024-02-21 DIAGNOSIS — I480 Paroxysmal atrial fibrillation: Secondary | ICD-10-CM | POA: Diagnosis not present

## 2024-02-21 DIAGNOSIS — I150 Renovascular hypertension: Secondary | ICD-10-CM | POA: Diagnosis not present

## 2024-02-21 DIAGNOSIS — I4891 Unspecified atrial fibrillation: Secondary | ICD-10-CM | POA: Diagnosis not present

## 2024-02-21 DIAGNOSIS — G629 Polyneuropathy, unspecified: Secondary | ICD-10-CM | POA: Diagnosis not present

## 2024-02-21 DIAGNOSIS — N189 Chronic kidney disease, unspecified: Secondary | ICD-10-CM | POA: Diagnosis not present

## 2024-02-21 DIAGNOSIS — G8929 Other chronic pain: Secondary | ICD-10-CM | POA: Diagnosis not present

## 2024-02-21 DIAGNOSIS — N183 Chronic kidney disease, stage 3 unspecified: Secondary | ICD-10-CM | POA: Diagnosis not present

## 2024-02-22 DIAGNOSIS — M6281 Muscle weakness (generalized): Secondary | ICD-10-CM | POA: Diagnosis not present

## 2024-02-22 DIAGNOSIS — R296 Repeated falls: Secondary | ICD-10-CM | POA: Diagnosis not present

## 2024-02-22 DIAGNOSIS — Z741 Need for assistance with personal care: Secondary | ICD-10-CM | POA: Diagnosis not present

## 2024-02-22 DIAGNOSIS — I4891 Unspecified atrial fibrillation: Secondary | ICD-10-CM | POA: Diagnosis not present

## 2024-02-22 DIAGNOSIS — M1990 Unspecified osteoarthritis, unspecified site: Secondary | ICD-10-CM | POA: Diagnosis not present

## 2024-02-23 DIAGNOSIS — I4891 Unspecified atrial fibrillation: Secondary | ICD-10-CM | POA: Diagnosis not present

## 2024-02-23 DIAGNOSIS — M1990 Unspecified osteoarthritis, unspecified site: Secondary | ICD-10-CM | POA: Diagnosis not present

## 2024-02-23 DIAGNOSIS — N183 Chronic kidney disease, stage 3 unspecified: Secondary | ICD-10-CM | POA: Diagnosis not present

## 2024-02-23 DIAGNOSIS — M6281 Muscle weakness (generalized): Secondary | ICD-10-CM | POA: Diagnosis not present

## 2024-02-23 DIAGNOSIS — R296 Repeated falls: Secondary | ICD-10-CM | POA: Diagnosis not present

## 2024-02-23 DIAGNOSIS — Z741 Need for assistance with personal care: Secondary | ICD-10-CM | POA: Diagnosis not present

## 2024-02-24 DIAGNOSIS — R296 Repeated falls: Secondary | ICD-10-CM | POA: Diagnosis not present

## 2024-02-24 DIAGNOSIS — M1990 Unspecified osteoarthritis, unspecified site: Secondary | ICD-10-CM | POA: Diagnosis not present

## 2024-02-24 DIAGNOSIS — M6281 Muscle weakness (generalized): Secondary | ICD-10-CM | POA: Diagnosis not present

## 2024-02-24 DIAGNOSIS — Z741 Need for assistance with personal care: Secondary | ICD-10-CM | POA: Diagnosis not present

## 2024-02-27 DIAGNOSIS — R296 Repeated falls: Secondary | ICD-10-CM | POA: Diagnosis not present

## 2024-02-27 DIAGNOSIS — Z741 Need for assistance with personal care: Secondary | ICD-10-CM | POA: Diagnosis not present

## 2024-02-27 DIAGNOSIS — M6281 Muscle weakness (generalized): Secondary | ICD-10-CM | POA: Diagnosis not present

## 2024-02-27 DIAGNOSIS — M1990 Unspecified osteoarthritis, unspecified site: Secondary | ICD-10-CM | POA: Diagnosis not present

## 2024-02-28 DIAGNOSIS — I4891 Unspecified atrial fibrillation: Secondary | ICD-10-CM | POA: Diagnosis not present

## 2024-02-28 DIAGNOSIS — M6281 Muscle weakness (generalized): Secondary | ICD-10-CM | POA: Diagnosis not present

## 2024-02-28 DIAGNOSIS — M1990 Unspecified osteoarthritis, unspecified site: Secondary | ICD-10-CM | POA: Diagnosis not present

## 2024-02-29 DIAGNOSIS — R262 Difficulty in walking, not elsewhere classified: Secondary | ICD-10-CM | POA: Diagnosis not present

## 2024-02-29 DIAGNOSIS — I4891 Unspecified atrial fibrillation: Secondary | ICD-10-CM | POA: Diagnosis not present

## 2024-02-29 DIAGNOSIS — N183 Chronic kidney disease, stage 3 unspecified: Secondary | ICD-10-CM | POA: Diagnosis not present

## 2024-02-29 DIAGNOSIS — W19XXXA Unspecified fall, initial encounter: Secondary | ICD-10-CM | POA: Diagnosis not present

## 2024-03-01 DIAGNOSIS — M1990 Unspecified osteoarthritis, unspecified site: Secondary | ICD-10-CM | POA: Diagnosis not present

## 2024-03-01 DIAGNOSIS — Z741 Need for assistance with personal care: Secondary | ICD-10-CM | POA: Diagnosis not present

## 2024-03-01 DIAGNOSIS — M6281 Muscle weakness (generalized): Secondary | ICD-10-CM | POA: Diagnosis not present

## 2024-03-01 DIAGNOSIS — R296 Repeated falls: Secondary | ICD-10-CM | POA: Diagnosis not present

## 2024-03-01 DIAGNOSIS — I4891 Unspecified atrial fibrillation: Secondary | ICD-10-CM | POA: Diagnosis not present

## 2024-03-06 DIAGNOSIS — G8929 Other chronic pain: Secondary | ICD-10-CM | POA: Diagnosis not present

## 2024-03-06 DIAGNOSIS — Z741 Need for assistance with personal care: Secondary | ICD-10-CM | POA: Diagnosis not present

## 2024-03-06 DIAGNOSIS — M1990 Unspecified osteoarthritis, unspecified site: Secondary | ICD-10-CM | POA: Diagnosis not present

## 2024-03-06 DIAGNOSIS — I4891 Unspecified atrial fibrillation: Secondary | ICD-10-CM | POA: Diagnosis not present

## 2024-03-06 DIAGNOSIS — M6281 Muscle weakness (generalized): Secondary | ICD-10-CM | POA: Diagnosis not present

## 2024-03-06 DIAGNOSIS — Z9181 History of falling: Secondary | ICD-10-CM | POA: Diagnosis not present

## 2024-03-06 DIAGNOSIS — R296 Repeated falls: Secondary | ICD-10-CM | POA: Diagnosis not present

## 2024-03-07 DIAGNOSIS — R296 Repeated falls: Secondary | ICD-10-CM | POA: Diagnosis not present

## 2024-03-07 DIAGNOSIS — W19XXXA Unspecified fall, initial encounter: Secondary | ICD-10-CM | POA: Diagnosis not present

## 2024-03-07 DIAGNOSIS — M1990 Unspecified osteoarthritis, unspecified site: Secondary | ICD-10-CM | POA: Diagnosis not present

## 2024-03-07 DIAGNOSIS — Z741 Need for assistance with personal care: Secondary | ICD-10-CM | POA: Diagnosis not present

## 2024-03-07 DIAGNOSIS — R262 Difficulty in walking, not elsewhere classified: Secondary | ICD-10-CM | POA: Diagnosis not present

## 2024-03-07 DIAGNOSIS — M6281 Muscle weakness (generalized): Secondary | ICD-10-CM | POA: Diagnosis not present

## 2024-03-10 DIAGNOSIS — S6990XA Unspecified injury of unspecified wrist, hand and finger(s), initial encounter: Secondary | ICD-10-CM | POA: Diagnosis not present

## 2024-03-10 DIAGNOSIS — I6521 Occlusion and stenosis of right carotid artery: Secondary | ICD-10-CM | POA: Diagnosis not present

## 2024-03-10 DIAGNOSIS — M25522 Pain in left elbow: Secondary | ICD-10-CM | POA: Diagnosis not present

## 2024-03-10 DIAGNOSIS — G319 Degenerative disease of nervous system, unspecified: Secondary | ICD-10-CM | POA: Diagnosis not present

## 2024-03-10 DIAGNOSIS — S61213A Laceration without foreign body of left middle finger without damage to nail, initial encounter: Secondary | ICD-10-CM | POA: Diagnosis not present

## 2024-03-10 DIAGNOSIS — S61217A Laceration without foreign body of left little finger without damage to nail, initial encounter: Secondary | ICD-10-CM | POA: Diagnosis not present

## 2024-03-10 DIAGNOSIS — W19XXXA Unspecified fall, initial encounter: Secondary | ICD-10-CM | POA: Diagnosis not present

## 2024-03-10 DIAGNOSIS — S0990XA Unspecified injury of head, initial encounter: Secondary | ICD-10-CM | POA: Diagnosis not present

## 2024-03-10 DIAGNOSIS — Z23 Encounter for immunization: Secondary | ICD-10-CM | POA: Diagnosis not present

## 2024-03-10 DIAGNOSIS — Z743 Need for continuous supervision: Secondary | ICD-10-CM | POA: Diagnosis not present

## 2024-03-10 DIAGNOSIS — S199XXA Unspecified injury of neck, initial encounter: Secondary | ICD-10-CM | POA: Diagnosis not present

## 2024-03-10 DIAGNOSIS — Z043 Encounter for examination and observation following other accident: Secondary | ICD-10-CM | POA: Diagnosis not present

## 2024-03-12 DIAGNOSIS — M1990 Unspecified osteoarthritis, unspecified site: Secondary | ICD-10-CM | POA: Diagnosis not present

## 2024-03-12 DIAGNOSIS — Z741 Need for assistance with personal care: Secondary | ICD-10-CM | POA: Diagnosis not present

## 2024-03-12 DIAGNOSIS — M6281 Muscle weakness (generalized): Secondary | ICD-10-CM | POA: Diagnosis not present

## 2024-03-12 DIAGNOSIS — R296 Repeated falls: Secondary | ICD-10-CM | POA: Diagnosis not present

## 2024-03-13 DIAGNOSIS — D649 Anemia, unspecified: Secondary | ICD-10-CM | POA: Diagnosis not present

## 2024-03-13 DIAGNOSIS — M6281 Muscle weakness (generalized): Secondary | ICD-10-CM | POA: Diagnosis not present

## 2024-03-13 DIAGNOSIS — R296 Repeated falls: Secondary | ICD-10-CM | POA: Diagnosis not present

## 2024-03-13 DIAGNOSIS — Z741 Need for assistance with personal care: Secondary | ICD-10-CM | POA: Diagnosis not present

## 2024-03-13 DIAGNOSIS — I10 Essential (primary) hypertension: Secondary | ICD-10-CM | POA: Diagnosis not present

## 2024-03-13 DIAGNOSIS — M1990 Unspecified osteoarthritis, unspecified site: Secondary | ICD-10-CM | POA: Diagnosis not present

## 2024-03-14 DIAGNOSIS — R262 Difficulty in walking, not elsewhere classified: Secondary | ICD-10-CM | POA: Diagnosis not present

## 2024-03-14 DIAGNOSIS — R296 Repeated falls: Secondary | ICD-10-CM | POA: Diagnosis not present

## 2024-03-14 DIAGNOSIS — M6281 Muscle weakness (generalized): Secondary | ICD-10-CM | POA: Diagnosis not present

## 2024-03-14 DIAGNOSIS — M1990 Unspecified osteoarthritis, unspecified site: Secondary | ICD-10-CM | POA: Diagnosis not present

## 2024-03-14 DIAGNOSIS — Z741 Need for assistance with personal care: Secondary | ICD-10-CM | POA: Diagnosis not present

## 2024-03-14 DIAGNOSIS — W19XXXA Unspecified fall, initial encounter: Secondary | ICD-10-CM | POA: Diagnosis not present

## 2024-03-15 DIAGNOSIS — R0989 Other specified symptoms and signs involving the circulatory and respiratory systems: Secondary | ICD-10-CM | POA: Diagnosis not present

## 2024-03-15 DIAGNOSIS — Z96611 Presence of right artificial shoulder joint: Secondary | ICD-10-CM | POA: Diagnosis not present

## 2024-03-15 DIAGNOSIS — R600 Localized edema: Secondary | ICD-10-CM | POA: Diagnosis not present

## 2024-03-15 DIAGNOSIS — Z743 Need for continuous supervision: Secondary | ICD-10-CM | POA: Diagnosis not present

## 2024-03-15 DIAGNOSIS — I499 Cardiac arrhythmia, unspecified: Secondary | ICD-10-CM | POA: Diagnosis not present

## 2024-03-15 DIAGNOSIS — R400 Somnolence: Secondary | ICD-10-CM | POA: Diagnosis not present

## 2024-03-15 DIAGNOSIS — R918 Other nonspecific abnormal finding of lung field: Secondary | ICD-10-CM | POA: Diagnosis not present

## 2024-03-15 DIAGNOSIS — Z96612 Presence of left artificial shoulder joint: Secondary | ICD-10-CM | POA: Diagnosis not present

## 2024-03-15 DIAGNOSIS — I4430 Unspecified atrioventricular block: Secondary | ICD-10-CM | POA: Diagnosis not present

## 2024-03-15 DIAGNOSIS — R7989 Other specified abnormal findings of blood chemistry: Secondary | ICD-10-CM | POA: Diagnosis not present

## 2024-03-19 DIAGNOSIS — R296 Repeated falls: Secondary | ICD-10-CM | POA: Diagnosis not present

## 2024-03-19 DIAGNOSIS — Z741 Need for assistance with personal care: Secondary | ICD-10-CM | POA: Diagnosis not present

## 2024-03-19 DIAGNOSIS — M1990 Unspecified osteoarthritis, unspecified site: Secondary | ICD-10-CM | POA: Diagnosis not present

## 2024-03-19 DIAGNOSIS — I5032 Chronic diastolic (congestive) heart failure: Secondary | ICD-10-CM | POA: Diagnosis not present

## 2024-03-19 DIAGNOSIS — M6281 Muscle weakness (generalized): Secondary | ICD-10-CM | POA: Diagnosis not present

## 2024-03-20 DIAGNOSIS — G8929 Other chronic pain: Secondary | ICD-10-CM | POA: Diagnosis not present

## 2024-03-20 DIAGNOSIS — M1990 Unspecified osteoarthritis, unspecified site: Secondary | ICD-10-CM | POA: Diagnosis not present

## 2024-03-20 DIAGNOSIS — R296 Repeated falls: Secondary | ICD-10-CM | POA: Diagnosis not present

## 2024-03-20 DIAGNOSIS — Z741 Need for assistance with personal care: Secondary | ICD-10-CM | POA: Diagnosis not present

## 2024-03-20 DIAGNOSIS — M6281 Muscle weakness (generalized): Secondary | ICD-10-CM | POA: Diagnosis not present

## 2024-03-21 DIAGNOSIS — M1990 Unspecified osteoarthritis, unspecified site: Secondary | ICD-10-CM | POA: Diagnosis not present

## 2024-03-21 DIAGNOSIS — M6281 Muscle weakness (generalized): Secondary | ICD-10-CM | POA: Diagnosis not present

## 2024-03-21 DIAGNOSIS — Z741 Need for assistance with personal care: Secondary | ICD-10-CM | POA: Diagnosis not present

## 2024-03-21 DIAGNOSIS — R296 Repeated falls: Secondary | ICD-10-CM | POA: Diagnosis not present

## 2024-03-22 DIAGNOSIS — Z741 Need for assistance with personal care: Secondary | ICD-10-CM | POA: Diagnosis not present

## 2024-03-22 DIAGNOSIS — M6281 Muscle weakness (generalized): Secondary | ICD-10-CM | POA: Diagnosis not present

## 2024-03-22 DIAGNOSIS — R296 Repeated falls: Secondary | ICD-10-CM | POA: Diagnosis not present

## 2024-03-22 DIAGNOSIS — M1990 Unspecified osteoarthritis, unspecified site: Secondary | ICD-10-CM | POA: Diagnosis not present

## 2024-03-24 DIAGNOSIS — Z743 Need for continuous supervision: Secondary | ICD-10-CM | POA: Diagnosis not present

## 2024-03-24 DIAGNOSIS — S0001XA Abrasion of scalp, initial encounter: Secondary | ICD-10-CM | POA: Diagnosis not present

## 2024-03-24 DIAGNOSIS — R0689 Other abnormalities of breathing: Secondary | ICD-10-CM | POA: Diagnosis not present

## 2024-03-24 DIAGNOSIS — S199XXA Unspecified injury of neck, initial encounter: Secondary | ICD-10-CM | POA: Diagnosis not present

## 2024-03-24 DIAGNOSIS — W19XXXA Unspecified fall, initial encounter: Secondary | ICD-10-CM | POA: Diagnosis not present

## 2024-03-24 DIAGNOSIS — R404 Transient alteration of awareness: Secondary | ICD-10-CM | POA: Diagnosis not present

## 2024-03-24 DIAGNOSIS — R6889 Other general symptoms and signs: Secondary | ICD-10-CM | POA: Diagnosis not present

## 2024-03-24 DIAGNOSIS — S0990XA Unspecified injury of head, initial encounter: Secondary | ICD-10-CM | POA: Diagnosis not present

## 2024-03-24 DIAGNOSIS — M50222 Other cervical disc displacement at C5-C6 level: Secondary | ICD-10-CM | POA: Diagnosis not present

## 2024-03-24 DIAGNOSIS — Z7401 Bed confinement status: Secondary | ICD-10-CM | POA: Diagnosis not present

## 2024-03-24 DIAGNOSIS — R58 Hemorrhage, not elsewhere classified: Secondary | ICD-10-CM | POA: Diagnosis not present

## 2024-03-24 DIAGNOSIS — S0003XA Contusion of scalp, initial encounter: Secondary | ICD-10-CM | POA: Diagnosis not present

## 2024-03-27 DIAGNOSIS — R296 Repeated falls: Secondary | ICD-10-CM | POA: Diagnosis not present

## 2024-03-27 DIAGNOSIS — Z741 Need for assistance with personal care: Secondary | ICD-10-CM | POA: Diagnosis not present

## 2024-03-27 DIAGNOSIS — M6281 Muscle weakness (generalized): Secondary | ICD-10-CM | POA: Diagnosis not present

## 2024-03-27 DIAGNOSIS — M1990 Unspecified osteoarthritis, unspecified site: Secondary | ICD-10-CM | POA: Diagnosis not present

## 2024-03-28 DIAGNOSIS — M6281 Muscle weakness (generalized): Secondary | ICD-10-CM | POA: Diagnosis not present

## 2024-03-28 DIAGNOSIS — S0091XA Abrasion of unspecified part of head, initial encounter: Secondary | ICD-10-CM | POA: Diagnosis not present

## 2024-03-28 DIAGNOSIS — M1990 Unspecified osteoarthritis, unspecified site: Secondary | ICD-10-CM | POA: Diagnosis not present

## 2024-03-28 DIAGNOSIS — R296 Repeated falls: Secondary | ICD-10-CM | POA: Diagnosis not present

## 2024-03-28 DIAGNOSIS — R262 Difficulty in walking, not elsewhere classified: Secondary | ICD-10-CM | POA: Diagnosis not present

## 2024-03-28 DIAGNOSIS — Z741 Need for assistance with personal care: Secondary | ICD-10-CM | POA: Diagnosis not present

## 2024-03-28 DIAGNOSIS — W19XXXA Unspecified fall, initial encounter: Secondary | ICD-10-CM | POA: Diagnosis not present

## 2024-03-29 DIAGNOSIS — Z741 Need for assistance with personal care: Secondary | ICD-10-CM | POA: Diagnosis not present

## 2024-03-29 DIAGNOSIS — M1990 Unspecified osteoarthritis, unspecified site: Secondary | ICD-10-CM | POA: Diagnosis not present

## 2024-03-29 DIAGNOSIS — M6281 Muscle weakness (generalized): Secondary | ICD-10-CM | POA: Diagnosis not present

## 2024-03-29 DIAGNOSIS — R296 Repeated falls: Secondary | ICD-10-CM | POA: Diagnosis not present

## 2024-03-30 DIAGNOSIS — R296 Repeated falls: Secondary | ICD-10-CM | POA: Diagnosis not present

## 2024-03-30 DIAGNOSIS — M1990 Unspecified osteoarthritis, unspecified site: Secondary | ICD-10-CM | POA: Diagnosis not present

## 2024-03-30 DIAGNOSIS — Z741 Need for assistance with personal care: Secondary | ICD-10-CM | POA: Diagnosis not present

## 2024-03-30 DIAGNOSIS — M6281 Muscle weakness (generalized): Secondary | ICD-10-CM | POA: Diagnosis not present

## 2024-04-03 DIAGNOSIS — R296 Repeated falls: Secondary | ICD-10-CM | POA: Diagnosis not present

## 2024-04-03 DIAGNOSIS — G8929 Other chronic pain: Secondary | ICD-10-CM | POA: Diagnosis not present

## 2024-04-03 DIAGNOSIS — Z741 Need for assistance with personal care: Secondary | ICD-10-CM | POA: Diagnosis not present

## 2024-04-03 DIAGNOSIS — M1990 Unspecified osteoarthritis, unspecified site: Secondary | ICD-10-CM | POA: Diagnosis not present

## 2024-04-03 DIAGNOSIS — M6281 Muscle weakness (generalized): Secondary | ICD-10-CM | POA: Diagnosis not present

## 2024-04-04 DIAGNOSIS — M6281 Muscle weakness (generalized): Secondary | ICD-10-CM | POA: Diagnosis not present

## 2024-04-04 DIAGNOSIS — R296 Repeated falls: Secondary | ICD-10-CM | POA: Diagnosis not present

## 2024-04-04 DIAGNOSIS — Z741 Need for assistance with personal care: Secondary | ICD-10-CM | POA: Diagnosis not present

## 2024-04-04 DIAGNOSIS — R262 Difficulty in walking, not elsewhere classified: Secondary | ICD-10-CM | POA: Diagnosis not present

## 2024-04-04 DIAGNOSIS — W19XXXA Unspecified fall, initial encounter: Secondary | ICD-10-CM | POA: Diagnosis not present

## 2024-04-04 DIAGNOSIS — M1990 Unspecified osteoarthritis, unspecified site: Secondary | ICD-10-CM | POA: Diagnosis not present

## 2024-04-05 DIAGNOSIS — Z741 Need for assistance with personal care: Secondary | ICD-10-CM | POA: Diagnosis not present

## 2024-04-05 DIAGNOSIS — M1990 Unspecified osteoarthritis, unspecified site: Secondary | ICD-10-CM | POA: Diagnosis not present

## 2024-04-05 DIAGNOSIS — R296 Repeated falls: Secondary | ICD-10-CM | POA: Diagnosis not present

## 2024-04-05 DIAGNOSIS — M6281 Muscle weakness (generalized): Secondary | ICD-10-CM | POA: Diagnosis not present

## 2024-04-06 DIAGNOSIS — M1990 Unspecified osteoarthritis, unspecified site: Secondary | ICD-10-CM | POA: Diagnosis not present

## 2024-04-06 DIAGNOSIS — Z741 Need for assistance with personal care: Secondary | ICD-10-CM | POA: Diagnosis not present

## 2024-04-06 DIAGNOSIS — M6281 Muscle weakness (generalized): Secondary | ICD-10-CM | POA: Diagnosis not present

## 2024-04-06 DIAGNOSIS — R296 Repeated falls: Secondary | ICD-10-CM | POA: Diagnosis not present

## 2024-04-09 DIAGNOSIS — Z741 Need for assistance with personal care: Secondary | ICD-10-CM | POA: Diagnosis not present

## 2024-04-09 DIAGNOSIS — M1990 Unspecified osteoarthritis, unspecified site: Secondary | ICD-10-CM | POA: Diagnosis not present

## 2024-04-09 DIAGNOSIS — M6281 Muscle weakness (generalized): Secondary | ICD-10-CM | POA: Diagnosis not present

## 2024-04-09 DIAGNOSIS — R296 Repeated falls: Secondary | ICD-10-CM | POA: Diagnosis not present

## 2024-05-02 DIAGNOSIS — I5032 Chronic diastolic (congestive) heart failure: Secondary | ICD-10-CM | POA: Diagnosis not present

## 2024-05-08 DIAGNOSIS — R404 Transient alteration of awareness: Secondary | ICD-10-CM | POA: Diagnosis not present

## 2024-05-08 DIAGNOSIS — S50312A Abrasion of left elbow, initial encounter: Secondary | ICD-10-CM | POA: Diagnosis not present

## 2024-05-08 DIAGNOSIS — Z7401 Bed confinement status: Secondary | ICD-10-CM | POA: Diagnosis not present

## 2024-05-08 DIAGNOSIS — Z743 Need for continuous supervision: Secondary | ICD-10-CM | POA: Diagnosis not present

## 2024-05-08 DIAGNOSIS — S1989XA Other specified injuries of other specified part of neck, initial encounter: Secondary | ICD-10-CM | POA: Diagnosis not present

## 2024-05-08 DIAGNOSIS — S134XXA Sprain of ligaments of cervical spine, initial encounter: Secondary | ICD-10-CM | POA: Diagnosis not present

## 2024-05-08 DIAGNOSIS — R531 Weakness: Secondary | ICD-10-CM | POA: Diagnosis not present

## 2024-05-08 DIAGNOSIS — S199XXA Unspecified injury of neck, initial encounter: Secondary | ICD-10-CM | POA: Diagnosis not present

## 2024-05-08 DIAGNOSIS — S0990XA Unspecified injury of head, initial encounter: Secondary | ICD-10-CM | POA: Diagnosis not present

## 2024-05-08 DIAGNOSIS — G4489 Other headache syndrome: Secondary | ICD-10-CM | POA: Diagnosis not present

## 2024-05-09 DIAGNOSIS — W19XXXA Unspecified fall, initial encounter: Secondary | ICD-10-CM | POA: Diagnosis not present

## 2024-05-09 DIAGNOSIS — S134XXA Sprain of ligaments of cervical spine, initial encounter: Secondary | ICD-10-CM | POA: Diagnosis not present

## 2024-05-09 DIAGNOSIS — R262 Difficulty in walking, not elsewhere classified: Secondary | ICD-10-CM | POA: Diagnosis not present

## 2024-05-15 DIAGNOSIS — G8929 Other chronic pain: Secondary | ICD-10-CM | POA: Diagnosis not present

## 2024-05-17 DIAGNOSIS — I10 Essential (primary) hypertension: Secondary | ICD-10-CM | POA: Diagnosis not present

## 2024-05-17 DIAGNOSIS — I4891 Unspecified atrial fibrillation: Secondary | ICD-10-CM | POA: Diagnosis not present

## 2024-05-17 DIAGNOSIS — E785 Hyperlipidemia, unspecified: Secondary | ICD-10-CM | POA: Diagnosis not present

## 2024-05-18 DIAGNOSIS — S134XXA Sprain of ligaments of cervical spine, initial encounter: Secondary | ICD-10-CM | POA: Diagnosis not present

## 2024-06-12 DIAGNOSIS — G8929 Other chronic pain: Secondary | ICD-10-CM | POA: Diagnosis not present

## 2024-06-13 DIAGNOSIS — S134XXA Sprain of ligaments of cervical spine, initial encounter: Secondary | ICD-10-CM | POA: Diagnosis not present

## 2024-06-13 DIAGNOSIS — R262 Difficulty in walking, not elsewhere classified: Secondary | ICD-10-CM | POA: Diagnosis not present

## 2024-06-13 DIAGNOSIS — W19XXXA Unspecified fall, initial encounter: Secondary | ICD-10-CM | POA: Diagnosis not present

## 2024-06-20 DIAGNOSIS — I150 Renovascular hypertension: Secondary | ICD-10-CM | POA: Diagnosis not present

## 2024-06-20 DIAGNOSIS — G629 Polyneuropathy, unspecified: Secondary | ICD-10-CM | POA: Diagnosis not present

## 2024-06-20 DIAGNOSIS — I5032 Chronic diastolic (congestive) heart failure: Secondary | ICD-10-CM | POA: Diagnosis not present

## 2024-06-20 DIAGNOSIS — N189 Chronic kidney disease, unspecified: Secondary | ICD-10-CM | POA: Diagnosis not present

## 2024-06-20 DIAGNOSIS — I480 Paroxysmal atrial fibrillation: Secondary | ICD-10-CM | POA: Diagnosis not present

## 2024-06-27 DIAGNOSIS — S20319A Abrasion of unspecified front wall of thorax, initial encounter: Secondary | ICD-10-CM | POA: Diagnosis not present

## 2024-07-08 DIAGNOSIS — L89892 Pressure ulcer of other site, stage 2: Secondary | ICD-10-CM | POA: Diagnosis not present

## 2024-07-08 DIAGNOSIS — R9389 Abnormal findings on diagnostic imaging of other specified body structures: Secondary | ICD-10-CM | POA: Diagnosis not present

## 2024-07-08 DIAGNOSIS — I482 Chronic atrial fibrillation, unspecified: Secondary | ICD-10-CM | POA: Diagnosis not present

## 2024-07-08 DIAGNOSIS — Z743 Need for continuous supervision: Secondary | ICD-10-CM | POA: Diagnosis not present

## 2024-07-08 DIAGNOSIS — I480 Paroxysmal atrial fibrillation: Secondary | ICD-10-CM | POA: Diagnosis not present

## 2024-07-08 DIAGNOSIS — D649 Anemia, unspecified: Secondary | ICD-10-CM | POA: Diagnosis not present

## 2024-07-08 DIAGNOSIS — R6521 Severe sepsis with septic shock: Secondary | ICD-10-CM | POA: Diagnosis not present

## 2024-07-08 DIAGNOSIS — R531 Weakness: Secondary | ICD-10-CM | POA: Diagnosis not present

## 2024-07-08 DIAGNOSIS — K5900 Constipation, unspecified: Secondary | ICD-10-CM | POA: Diagnosis not present

## 2024-07-08 DIAGNOSIS — R404 Transient alteration of awareness: Secondary | ICD-10-CM | POA: Diagnosis not present

## 2024-07-08 DIAGNOSIS — N390 Urinary tract infection, site not specified: Secondary | ICD-10-CM | POA: Diagnosis not present

## 2024-07-08 DIAGNOSIS — I5032 Chronic diastolic (congestive) heart failure: Secondary | ICD-10-CM | POA: Diagnosis not present

## 2024-07-08 DIAGNOSIS — D696 Thrombocytopenia, unspecified: Secondary | ICD-10-CM | POA: Diagnosis not present

## 2024-07-08 DIAGNOSIS — G9341 Metabolic encephalopathy: Secondary | ICD-10-CM | POA: Diagnosis not present

## 2024-07-08 DIAGNOSIS — Z85828 Personal history of other malignant neoplasm of skin: Secondary | ICD-10-CM | POA: Diagnosis not present

## 2024-07-08 DIAGNOSIS — H3530 Unspecified macular degeneration: Secondary | ICD-10-CM | POA: Diagnosis not present

## 2024-07-08 DIAGNOSIS — Z96611 Presence of right artificial shoulder joint: Secondary | ICD-10-CM | POA: Diagnosis not present

## 2024-07-08 DIAGNOSIS — I130 Hypertensive heart and chronic kidney disease with heart failure and stage 1 through stage 4 chronic kidney disease, or unspecified chronic kidney disease: Secondary | ICD-10-CM | POA: Diagnosis not present

## 2024-07-08 DIAGNOSIS — I081 Rheumatic disorders of both mitral and tricuspid valves: Secondary | ICD-10-CM | POA: Diagnosis not present

## 2024-07-08 DIAGNOSIS — I272 Pulmonary hypertension, unspecified: Secondary | ICD-10-CM | POA: Diagnosis not present

## 2024-07-08 DIAGNOSIS — N3090 Cystitis, unspecified without hematuria: Secondary | ICD-10-CM | POA: Diagnosis not present

## 2024-07-08 DIAGNOSIS — J9811 Atelectasis: Secondary | ICD-10-CM | POA: Diagnosis not present

## 2024-07-08 DIAGNOSIS — I4510 Unspecified right bundle-branch block: Secondary | ICD-10-CM | POA: Diagnosis not present

## 2024-07-08 DIAGNOSIS — A419 Sepsis, unspecified organism: Secondary | ICD-10-CM | POA: Diagnosis not present

## 2024-07-08 DIAGNOSIS — E872 Acidosis, unspecified: Secondary | ICD-10-CM | POA: Diagnosis not present

## 2024-07-08 DIAGNOSIS — B952 Enterococcus as the cause of diseases classified elsewhere: Secondary | ICD-10-CM | POA: Diagnosis not present

## 2024-07-08 DIAGNOSIS — Z66 Do not resuscitate: Secondary | ICD-10-CM | POA: Diagnosis not present

## 2024-07-08 DIAGNOSIS — Z96612 Presence of left artificial shoulder joint: Secondary | ICD-10-CM | POA: Diagnosis not present

## 2024-07-08 DIAGNOSIS — N183 Chronic kidney disease, stage 3 unspecified: Secondary | ICD-10-CM | POA: Diagnosis not present

## 2024-07-08 DIAGNOSIS — L89151 Pressure ulcer of sacral region, stage 1: Secondary | ICD-10-CM | POA: Diagnosis not present

## 2024-07-08 DIAGNOSIS — Z515 Encounter for palliative care: Secondary | ICD-10-CM | POA: Diagnosis not present

## 2024-07-08 DIAGNOSIS — R569 Unspecified convulsions: Secondary | ICD-10-CM | POA: Diagnosis not present

## 2024-07-08 DIAGNOSIS — Z85820 Personal history of malignant melanoma of skin: Secondary | ICD-10-CM | POA: Diagnosis not present

## 2024-07-08 DIAGNOSIS — A4181 Sepsis due to Enterococcus: Secondary | ICD-10-CM | POA: Diagnosis not present

## 2024-07-11 DIAGNOSIS — R404 Transient alteration of awareness: Secondary | ICD-10-CM | POA: Diagnosis not present

## 2024-07-11 DIAGNOSIS — I959 Hypotension, unspecified: Secondary | ICD-10-CM | POA: Diagnosis not present

## 2024-07-11 DIAGNOSIS — A419 Sepsis, unspecified organism: Secondary | ICD-10-CM | POA: Diagnosis not present

## 2024-07-11 DIAGNOSIS — Z743 Need for continuous supervision: Secondary | ICD-10-CM | POA: Diagnosis not present

## 2024-07-11 DIAGNOSIS — R6521 Severe sepsis with septic shock: Secondary | ICD-10-CM | POA: Diagnosis not present

## 2024-07-30 DEATH — deceased

## 2024-11-07 ENCOUNTER — Encounter: Payer: Self-pay | Admitting: Cardiology
